# Patient Record
Sex: Male | Born: 1950 | Race: Black or African American | Hispanic: No | Marital: Married | Smoking: Current every day smoker
Health system: Southern US, Community
[De-identification: ages and names within clinical notes are randomized; demographics above are authoritative.]

## PROBLEM LIST (undated history)

## (undated) DIAGNOSIS — I10 Essential (primary) hypertension: Secondary | ICD-10-CM

## (undated) DIAGNOSIS — H409 Unspecified glaucoma: Secondary | ICD-10-CM

## (undated) DIAGNOSIS — H9190 Unspecified hearing loss, unspecified ear: Secondary | ICD-10-CM

---

## 2014-07-20 ENCOUNTER — Inpatient Hospital Stay (HOSPITAL_COMMUNITY)
Admission: EM | Admit: 2014-07-20 | Discharge: 2014-07-23 | DRG: 506 | Disposition: A | Discharge status: Home / Self Care | Payer: Medicare Other | Attending: Orthopaedic Surgery | Admitting: Orthopaedic Surgery

## 2014-07-20 ENCOUNTER — Emergency Department (HOSPITAL_COMMUNITY): Payer: Medicare Other

## 2014-07-20 ENCOUNTER — Encounter (HOSPITAL_COMMUNITY): Payer: Self-pay | Admitting: Emergency Medicine

## 2014-07-20 DIAGNOSIS — Z79899 Other long term (current) drug therapy: Secondary | ICD-10-CM

## 2014-07-20 DIAGNOSIS — M009 Pyogenic arthritis, unspecified: Secondary | ICD-10-CM | POA: Diagnosis not present

## 2014-07-20 DIAGNOSIS — M25539 Pain in unspecified wrist: Secondary | ICD-10-CM | POA: Diagnosis not present

## 2014-07-20 DIAGNOSIS — H409 Unspecified glaucoma: Secondary | ICD-10-CM | POA: Diagnosis present

## 2014-07-20 DIAGNOSIS — F17200 Nicotine dependence, unspecified, uncomplicated: Secondary | ICD-10-CM | POA: Diagnosis present

## 2014-07-20 DIAGNOSIS — I10 Essential (primary) hypertension: Secondary | ICD-10-CM | POA: Diagnosis present

## 2014-07-20 HISTORY — DX: Unspecified glaucoma: H40.9

## 2014-07-20 HISTORY — DX: Essential (primary) hypertension: I10

## 2014-07-20 LAB — BASIC METABOLIC PANEL
Anion gap: 14 (ref 5–15)
BUN: 10 mg/dL (ref 6–23)
CO2: 22 meq/L (ref 19–32)
CREATININE: 0.93 mg/dL (ref 0.50–1.35)
Calcium: 9.6 mg/dL (ref 8.4–10.5)
Chloride: 100 mEq/L (ref 96–112)
GFR calc Af Amer: >90 mL/min (ref >90)
GFR calc non Af Amer: 86 mL/min — ABNORMAL LOW (ref >90)
Glucose, Bld: 135 mg/dL — ABNORMAL HIGH (ref 70–99)
Potassium: 3.8 mEq/L (ref 3.7–5.3)
Sodium: 136 mEq/L — ABNORMAL LOW (ref 137–147)

## 2014-07-20 LAB — CBC WITH DIFFERENTIAL/PLATELET
Basophils Absolute: 0 10*3/uL (ref 0.0–0.1)
Basophils Relative: 0 % (ref 0–1)
EOS PCT: 0 % (ref 0–5)
Eosinophils Absolute: 0 10*3/uL (ref 0.0–0.7)
HEMATOCRIT: 36.1 % — AB (ref 39.0–52.0)
Hemoglobin: 12.1 g/dL — ABNORMAL LOW (ref 13.0–17.0)
LYMPHS PCT: 14 % (ref 12–46)
Lymphs Abs: 1.5 10*3/uL (ref 0.7–4.0)
MCH: 30.6 pg (ref 26.0–34.0)
MCHC: 33.5 g/dL (ref 30.0–36.0)
MCV: 91.2 fL (ref 78.0–100.0)
MONO ABS: 1.2 10*3/uL — AB (ref 0.1–1.0)
Monocytes Relative: 10 % (ref 3–12)
Neutro Abs: 8.7 10*3/uL — ABNORMAL HIGH (ref 1.7–7.7)
Neutrophils Relative %: 76 % (ref 43–77)
Platelets: 323 10*3/uL (ref 150–400)
RBC: 3.96 MIL/uL — ABNORMAL LOW (ref 4.22–5.81)
RDW: 12.1 % (ref 11.5–15.5)
WBC: 11.4 10*3/uL — AB (ref 4.0–10.5)

## 2014-07-20 LAB — SEDIMENTATION RATE: Sed Rate: 85 mm/hr — ABNORMAL HIGH (ref 0–16)

## 2014-07-20 LAB — URIC ACID: Uric Acid, Serum: 3.8 mg/dL — ABNORMAL LOW (ref 4.0–7.8)

## 2014-07-20 MED ORDER — OXYCODONE-ACETAMINOPHEN 5-325 MG PO TABS
1.0000 | ORAL_TABLET | Freq: Once | ORAL | Status: AC
  Administered 2014-07-20: 1 via ORAL
  Filled 2014-07-20: qty 1

## 2014-07-20 MED ORDER — ONDANSETRON HCL 4 MG/2ML IJ SOLN
INTRAMUSCULAR | Status: AC
  Filled 2014-07-20: qty 2

## 2014-07-20 MED ORDER — LIDOCAINE HCL 2 % IJ SOLN: 10.0000 mL | Freq: Once | INTRAMUSCULAR | Status: DC

## 2014-07-20 MED ORDER — VANCOMYCIN HCL 10 G IV SOLR
1250.0000 mg | Freq: Once | INTRAVENOUS | Status: AC
  Administered 2014-07-20: 1250 mg via INTRAVENOUS
  Filled 2014-07-20: qty 1250

## 2014-07-20 MED ORDER — SODIUM CHLORIDE 0.9 % IV BOLUS (SEPSIS)
1000.0000 mL | Freq: Once | INTRAVENOUS | Status: AC
  Administered 2014-07-20: 1000 mL via INTRAVENOUS

## 2014-07-20 MED ORDER — ARTIFICIAL TEARS OP OINT
TOPICAL_OINTMENT | OPHTHALMIC | Status: AC
  Filled 2014-07-20: qty 3.5

## 2014-07-20 MED ORDER — LIDOCAINE HCL (CARDIAC) 20 MG/ML IV SOLN
INTRAVENOUS | Status: AC
  Filled 2014-07-20: qty 5

## 2014-07-20 MED ORDER — PHENYLEPHRINE 40 MCG/ML (10ML) SYRINGE FOR IV PUSH (FOR BLOOD PRESSURE SUPPORT)
PREFILLED_SYRINGE | INTRAVENOUS | Status: AC
  Filled 2014-07-20: qty 10

## 2014-07-20 MED ORDER — PROPOFOL 10 MG/ML IV BOLUS
INTRAVENOUS | Status: AC
  Filled 2014-07-20: qty 20

## 2014-07-20 MED ORDER — FENTANYL CITRATE 0.05 MG/ML IJ SOLN
INTRAMUSCULAR | Status: AC
  Filled 2014-07-20: qty 5

## 2014-07-20 NOTE — ED Notes (Signed)
PT IS HARD OF HEARING. PRESENTS WITH LEFT WRIST REDNESS AND PAIN AND WARMTH, BEGAN THIS am. PAINFUL TO TOUCH. DENIES INJURY.

## 2014-07-20 NOTE — ED Provider Notes (Signed)
CSN: [id number]     Arrival date & time 07/20/14  1825 History   This chart was scribed for a non-physician practitioner, Emilia Beck, PA-C working with Glynn Octave, MD by [place] Peace, ED Scribe. The patient was seen in [facility]. The patient's care was started at 8:02 PM.     Chief Complaint  Patient presents with  . Wrist Pain      Patient is a 63 y.o. male presenting with wrist pain. The history is provided by the patient. No language interpreter was used.  Wrist Pain This is a new problem. The current episode started yesterday. The problem has been gradually worsening. Pertinent negatives include no headaches and no shortness of breath. The symptoms are aggravated by twisting and bending.  HPI Comments: Edwin Wright is a 63 y.o. male who presents to the Emergency Department complaining of left wrist pain onset yesterday with associated redness and swelling that has progressively gotten worse. He denies any falls or injuries that could be responsible for current problem. His relatives who are here with him the ED report that he lives alone. Pt has very hard time hearing but was able to respond to questions and commands once voice is raised.    Past Medical History  Diagnosis Date  . Hypertension   . Glaucoma    History reviewed. No pertinent past surgical history. History reviewed. No pertinent family history. History  Substance Use Topics  . Smoking status: Current Every Day Smoker    Types: Cigarettes  . Smokeless tobacco: Not on file  . Alcohol Use: No    Review of Systems  Constitutional: Negative for fever and chills.  Respiratory: Negative for shortness of breath.   Gastrointestinal: Negative for nausea and vomiting.  Musculoskeletal:       Left wrist pain with associated swelling, redness and warmth.   Skin: Positive for color change.  Neurological: Negative for headaches.  All other systems reviewed and are negative.     Allergies  Review of  patient's allergies indicates no known allergies.  Home Medications   Prior to Admission medications   Not on File   BP 174/74  Pulse 94  Temp(Src) 98.2 F (36.8 C) (Oral)  Resp 15  SpO2 98% Physical Exam  Nursing note and vitals reviewed. Constitutional: He is oriented to person, place, and time. He appears well-developed and well-nourished. No distress.  HENT:  Head: Normocephalic and atraumatic.  Eyes: Conjunctivae and EOM are normal.  Neck: Neck supple. No tracheal deviation present.  Cardiovascular: Normal rate.   Pulmonary/Chest: Effort normal. No respiratory distress.  Musculoskeletal: Normal range of motion.  Generalized left wrist edema and erythema and warm to touch. Severely limited ROM of left wrist due to pain. No obvious deformity. Limited ROM of left fingers due to pain as well. Cap refill of left hand intact.   Neurological: He is alert and oriented to person, place, and time.  Left hand sensation intact.   Skin: Skin is warm and dry.  Psychiatric: He has a normal mood and affect. His behavior is normal.     ED Course  ARTHOCENTESIS Date/Time: 07/20/2014 11:26 PM Performed by: Emilia Beck Authorized by: Emilia Beck Consent: Verbal consent obtained. Risks and benefits: risks, benefits and alternatives were discussed Consent given by: patient Patient understanding: patient states understanding of the procedure being performed Patient consent: the patient's understanding of the procedure matches consent given Patient identity confirmed: verbally with patient Indications: possible septic joint  Body area: wrist Joint: left  wrist Local anesthesia used: yes Anesthesia: local infiltration Local anesthetic: lidocaine 1% without epinephrine Anesthetic total: 3 ml Patient sedated: no Preparation: Patient was prepped and draped in the usual sterile fashion. Needle gauge: 22 G Ultrasound guidance: no Approach: dorsal. Aspirate: purulent Aspirate  amount: 1 ml Patient tolerance: Patient tolerated the procedure well with no immediate complications.   (including critical care time) Labs Review Labs Reviewed  CBC WITH DIFFERENTIAL - Abnormal; Notable for the following:    WBC 11.4 (*)    RBC 3.96 (*)    Hemoglobin 12.1 (*)    HCT 36.1 (*)    Neutro Abs 8.7 (*)    Monocytes Absolute 1.2 (*)    All other components within normal limits  BASIC METABOLIC PANEL - Abnormal; Notable for the following:    Sodium 136 (*)    Glucose, Bld 135 (*)    GFR calc non Af Amer 86 (*)    All other components within normal limits  URIC ACID - Abnormal; Notable for the following:    Uric Acid, Serum 3.8 (*)    All other components within normal limits  SEDIMENTATION RATE - Abnormal; Notable for the following:    Sed Rate 85 (*)    All other components within normal limits  SYNOVIAL CELL COUNT + DIFF, W/ CRYSTALS - Abnormal; Notable for the following:    Color, Synovial RED (*)    Appearance-Synovial CLOUDY (*)    Neutrophil, Synovial 85 (*)    Monocyte-Macrophage-Synovial Fluid 11 (*)    All other components within normal limits  GRAM STAIN  BODY FLUID CULTURE  ANAEROBIC CULTURE  C-REACTIVE PROTEIN  BODY FLUID CRYSTAL  CBC WITH DIFFERENTIAL  CBG MONITORING, ED     Results for orders placed during the hospital encounter of 07/20/14  GRAM STAIN      Result Value Ref Range   Specimen Description SYNOVIAL FLUID WRIST LEFT     Special Requests Normal     Gram Stain       Value: ABUNDANT WBC PRESENT,BOTH PMN AND MONONUCLEAR     NO ORGANISMS SEEN   Report Status 07/21/2014 FINAL    CBC WITH DIFFERENTIAL      Result Value Ref Range   WBC 11.4 (*) 4.0 - 10.5 K/uL   RBC 3.96 (*) 4.22 - 5.81 MIL/uL   Hemoglobin 12.1 (*) 13.0 - 17.0 g/dL   HCT 16.1 (*) 09.6 - 04.5 %   MCV 91.2  78.0 - 100.0 fL   MCH 30.6  26.0 - 34.0 pg   MCHC 33.5  30.0 - 36.0 g/dL   RDW 40.9  81.1 - 91.4 %   Platelets 323  150 - 400 K/uL   Neutrophils Relative % 76   43 - 77 %   Neutro Abs 8.7 (*) 1.7 - 7.7 K/uL   Lymphocytes Relative 14  12 - 46 %   Lymphs Abs 1.5  0.7 - 4.0 K/uL   Monocytes Relative 10  3 - 12 %   Monocytes Absolute 1.2 (*) 0.1 - 1.0 K/uL   Eosinophils Relative 0  0 - 5 %   Eosinophils Absolute 0.0  0.0 - 0.7 K/uL   Basophils Relative 0  0 - 1 %   Basophils Absolute 0.0  0.0 - 0.1 K/uL  BASIC METABOLIC PANEL      Result Value Ref Range   Sodium 136 (*) 137 - 147 mEq/L   Potassium 3.8  3.7 - 5.3 mEq/L   Chloride 100  96 - 112 mEq/L   CO2 22  19 - 32 mEq/L   Glucose, Bld 135 (*) 70 - 99 mg/dL   BUN 10  6 - 23 mg/dL   Creatinine, Ser 1.61  0.50 - 1.35 mg/dL   Calcium 9.6  8.4 - 09.6 mg/dL   GFR calc non Af Amer 86 (*) >90 mL/min   GFR calc Af Amer >90  >90 mL/min   Anion gap 14  5 - 15  URIC ACID      Result Value Ref Range   Uric Acid, Serum 3.8 (*) 4.0 - 7.8 mg/dL  SEDIMENTATION RATE      Result Value Ref Range   Sed Rate 85 (*) 0 - 16 mm/hr  SYNOVIAL CELL COUNT + DIFF, W/ CRYSTALS      Result Value Ref Range   Color, Synovial RED (*) YELLOW   Appearance-Synovial CLOUDY (*) CLEAR   Crystals, Fluid NO CRYSTALS SEEN     WBC, Synovial UNABLE TO PERFORM COUNT DUE TO CLOT IN SPECIMEN  0 - 200 /cu mm   Neutrophil, Synovial 85 (*) 0 - 25 %   Lymphocytes-Synovial Fld 3  0 - 20 %   Monocyte-Macrophage-Synovial Fluid 11 (*) 50 - 90 %   Eosinophils-Synovial 1  0 - 1 %   No results found.    Imaging Review Dg Wrist Complete Left  07/20/2014   CLINICAL DATA:  Pain and swelling about left wrist  EXAM: LEFT WRIST - COMPLETE 3+ VIEW  COMPARISON:  None.  FINDINGS: No acute fracture. No dislocation. There are cystic areas and erosions in the proximal scaphoid. There is also calcification within the soft tissues dorsal to the proximal carpal row, node on the lateral view. 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 Joint spaces are maintained. Bone mineral density is within normal limits.  IMPRESSION:  No acute bony injury.  Findings suggestive of CPPD. Septic arthritis is not entirely excluded. Correlate clinically.   Electronically Signed   By: Maryclare Bean M.D.   On: 07/20/2014 19:47     EKG Interpretation None     Medications  oxyCODONE-acetaminophen (PERCOCET/ROXICET) 5-325 MG per tablet 1 tablet (1 tablet Oral Given 07/20/14 1854)    8:07 PM- Treatment plan was discussed with patient who verbalizes understanding and agrees.   MDM   Final diagnoses:  Septic joint of left wrist    11:28 PM Patient presents with a 2 day history of left wrist swelling and pain. Xray shows no acute bony injury but suggest CPPD and cannot exclude septic arthritis. I spoke with Dr. Ophelia Charter of hand surgery who recommends joint aspiration for fluid analysis. When I aspirated the joint, the fluid was purulent. Dr. Ophelia Charter was called again and he will take the patient to the OR. Patient now has red streaking and warmth traveling proximally up his left arm which was not presents on my initial exam. Patient's labs show elevated WBC at 11.4, low uri acid at 3.8 and elevated sed rate at 85. Vitals stable and patient afebrile.    I personally performed the services described in this documentation, which was scribed in my presence. The recorded information has been reviewed and is accurate.    Emilia Beck, PA-C 07/21/14 [phone]

## 2014-07-20 NOTE — H&P (Addendum)
Edwin Wright is an 63 y.o. male.   Chief Complaint:painful left wrist, red and swollen HPI: 63 yo male with 24 hrs Hx of increase left wrist pain and swelling  Decrease ROM. No trauma Hx  Past Medical History  Diagnosis Date  . Hypertension   . Glaucoma     History reviewed. No pertinent past surgical history.  History reviewed. No pertinent family history. Social History:  reports that he has been smoking Cigarettes.  He has been smoking about 0.00 packs per day. He does not have any smokeless tobacco history on file. He reports that he does not drink alcohol or use illicit drugs.  Allergies: No Known Allergies   (Not in a hospital admission)  Results for orders placed during the hospital encounter of 07/20/14 (from the past 48 hour(s))  CBC WITH DIFFERENTIAL     Status: Abnormal   Collection Time    07/20/14  8:17 PM      Result Value Ref Range   WBC 11.4 (*) 4.0 - 10.5 K/uL   RBC 3.96 (*) 4.22 - 5.81 MIL/uL   Hemoglobin 12.1 (*) 13.0 - 17.0 g/dL   HCT 36.1 (*) 39.0 - 52.0 %   MCV 91.2  78.0 - 100.0 fL   MCH 30.6  26.0 - 34.0 pg   MCHC 33.5  30.0 - 36.0 g/dL   RDW 12.1  11.5 - 15.5 %   Platelets 323  150 - 400 K/uL   Neutrophils Relative % 76  43 - 77 %   Neutro Abs 8.7 (*) 1.7 - 7.7 K/uL   Lymphocytes Relative 14  12 - 46 %   Lymphs Abs 1.5  0.7 - 4.0 K/uL   Monocytes Relative 10  3 - 12 %   Monocytes Absolute 1.2 (*) 0.1 - 1.0 K/uL   Eosinophils Relative 0  0 - 5 %   Eosinophils Absolute 0.0  0.0 - 0.7 K/uL   Basophils Relative 0  0 - 1 %   Basophils Absolute 0.0  0.0 - 0.1 K/uL  BASIC METABOLIC PANEL     Status: Abnormal   Collection Time    07/20/14  8:17 PM      Result Value Ref Range   Sodium 136 (*) 137 - 147 mEq/L   Potassium 3.8  3.7 - 5.3 mEq/L   Chloride 100  96 - 112 mEq/L   CO2 22  19 - 32 mEq/L   Glucose, Bld 135 (*) 70 - 99 mg/dL   BUN 10  6 - 23 mg/dL   Creatinine, Ser 0.93  0.50 - 1.35 mg/dL   Calcium 9.6  8.4 - 10.5 mg/dL   GFR calc non Af  Amer 86 (*) >90 mL/min   GFR calc Af Amer >90  >90 mL/min   Comment: (NOTE)     The eGFR has been calculated using the CKD EPI equation.     This calculation has not been validated in all clinical situations.     eGFR's persistently <90 mL/min signify possible Chronic Kidney     Disease.   Anion gap 14  5 - 15  URIC ACID     Status: Abnormal   Collection Time    07/20/14  8:17 PM      Result Value Ref Range   Uric Acid, Serum 3.8 (*) 4.0 - 7.8 mg/dL  SEDIMENTATION RATE     Status: Abnormal   Collection Time    07/20/14  8:17 PM  Result Value Ref Range   Sed Rate 85 (*) 0 - 16 mm/hr   Dg Wrist Complete Left  07/20/2014   CLINICAL DATA:  Pain and swelling about left wrist  EXAM: LEFT WRIST - COMPLETE 3+ VIEW  COMPARISON:  None.  FINDINGS: No acute fracture. No dislocation. There are cystic areas and erosions in the proximal scaphoid. There is also calcification within the soft tissues dorsal to the proximal carpal row, node on the lateral view. 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 Joint spaces are maintained. Bone mineral density is within normal limits.  IMPRESSION: No acute bony injury.  Findings suggestive of CPPD. Septic arthritis is not entirely excluded. Correlate clinically.   Electronically Signed   By: Maryclare Bean M.D.   On: 07/20/2014 19:47    Review of Systems  Constitutional: Negative for fever, chills, weight loss and malaise/fatigue.  Eyes: Negative for blurred vision.       Glasses  Respiratory: Negative for cough.   Cardiovascular: Negative for chest pain.       HTN  Gastrointestinal: Negative for heartburn.  Genitourinary: Negative for dysuria.  Musculoskeletal: Positive for joint pain.  Skin: Negative for rash.  Neurological: Negative.  Negative for headaches.  Endo/Heme/Allergies: Negative.   Psychiatric/Behavioral: Negative.     Blood pressure 150/71, pulse 83, temperature 98.2 F (36.8 C), temperature source Oral,  resp. rate 12, SpO2 99.00%. Physical Exam  Constitutional: He appears well-developed and well-nourished.  HENT:  Head: Normocephalic.  Eyes: Pupils are equal, round, and reactive to light.  Neck: Normal range of motion.  Cardiovascular: Normal rate.   Respiratory: Effort normal.  GI: Soft.  Musculoskeletal:  Painful left wrist with erythema. Pain with ROM  Neurological: He is alert.  Skin: Rash noted. There is erythema.  Psychiatric: He has a normal mood and affect. His behavior is normal.     Assessment/Plan Left wrist pyarthrosis, aspiration pus by PA with cultures sent and Vanc. Starting.   Plan to OR for I and D of pyarthrosis  YATES,MARK C 07/20/2014, 11:45 PM

## 2014-07-21 ENCOUNTER — Emergency Department (HOSPITAL_COMMUNITY): Payer: Medicare Other | Admitting: Anesthesiology

## 2014-07-21 ENCOUNTER — Encounter (HOSPITAL_COMMUNITY): Payer: Medicare Other | Admitting: Anesthesiology

## 2014-07-21 ENCOUNTER — Encounter (HOSPITAL_COMMUNITY)
Admission: EM | Disposition: A | Discharge status: Home / Self Care | Payer: Self-pay | Source: Home / Self Care | Attending: Orthopaedic Surgery

## 2014-07-21 DIAGNOSIS — M25539 Pain in unspecified wrist: Secondary | ICD-10-CM | POA: Diagnosis present

## 2014-07-21 DIAGNOSIS — Z79899 Other long term (current) drug therapy: Secondary | ICD-10-CM | POA: Diagnosis not present

## 2014-07-21 DIAGNOSIS — M009 Pyogenic arthritis, unspecified: Secondary | ICD-10-CM | POA: Diagnosis present

## 2014-07-21 DIAGNOSIS — H409 Unspecified glaucoma: Secondary | ICD-10-CM | POA: Diagnosis present

## 2014-07-21 DIAGNOSIS — F17200 Nicotine dependence, unspecified, uncomplicated: Secondary | ICD-10-CM | POA: Diagnosis present

## 2014-07-21 DIAGNOSIS — I10 Essential (primary) hypertension: Secondary | ICD-10-CM | POA: Diagnosis present

## 2014-07-21 HISTORY — PX: ARTHROTOMY: SHX5728

## 2014-07-21 LAB — SYNOVIAL CELL COUNT + DIFF, W/ CRYSTALS
CRYSTALS FLUID: NONE SEEN
Eosinophils-Synovial: 1 % (ref 0–1)
Lymphocytes-Synovial Fld: 3 % (ref 0–20)
MONOCYTE-MACROPHAGE-SYNOVIAL FLUID: 11 % — AB (ref 50–90)
Neutrophil, Synovial: 85 % — ABNORMAL HIGH (ref 0–25)
WBC, SYNOVIAL: UNDETERMINED /mm3 (ref 0–200)

## 2014-07-21 LAB — CBC WITH DIFFERENTIAL/PLATELET
Basophils Absolute: 0 10*3/uL (ref 0.0–0.1)
Basophils Relative: 0 % (ref 0–1)
EOS ABS: 0.1 10*3/uL (ref 0.0–0.7)
Eosinophils Relative: 1 % (ref 0–5)
HCT: 37 % — ABNORMAL LOW (ref 39.0–52.0)
Hemoglobin: 12.4 g/dL — ABNORMAL LOW (ref 13.0–17.0)
Lymphocytes Relative: 15 % (ref 12–46)
Lymphs Abs: 1.6 10*3/uL (ref 0.7–4.0)
MCH: 30.5 pg (ref 26.0–34.0)
MCHC: 33.5 g/dL (ref 30.0–36.0)
MCV: 91.1 fL (ref 78.0–100.0)
Monocytes Absolute: 1.1 10*3/uL — ABNORMAL HIGH (ref 0.1–1.0)
Monocytes Relative: 10 % (ref 3–12)
NEUTROS PCT: 74 % (ref 43–77)
Neutro Abs: 8.1 10*3/uL — ABNORMAL HIGH (ref 1.7–7.7)
PLATELETS: 338 10*3/uL (ref 150–400)
RBC: 4.06 MIL/uL — ABNORMAL LOW (ref 4.22–5.81)
RDW: 12.1 % (ref 11.5–15.5)
WBC: 10.9 10*3/uL — ABNORMAL HIGH (ref 4.0–10.5)

## 2014-07-21 LAB — GRAM STAIN: Special Requests: NORMAL

## 2014-07-21 LAB — C-REACTIVE PROTEIN: CRP: 5.7 mg/dL — ABNORMAL HIGH (ref <0.60)

## 2014-07-21 SURGERY — ARTHROTOMY
Anesthesia: General | Site: Wrist | Laterality: Left

## 2014-07-21 MED ORDER — VANCOMYCIN HCL 10 G IV SOLR
1500.0000 mg | INTRAVENOUS | Status: DC
  Administered 2014-07-22: 1500 mg via INTRAVENOUS
  Filled 2014-07-21: qty 1500

## 2014-07-21 MED ORDER — VANCOMYCIN HCL IN DEXTROSE 750-5 MG/150ML-% IV SOLN
750.0000 mg | Freq: Two times a day (BID) | INTRAVENOUS | Status: DC
  Filled 2014-07-21: qty 150

## 2014-07-21 MED ORDER — OXYCODONE-ACETAMINOPHEN 5-325 MG PO TABS
1.0000 | ORAL_TABLET | ORAL | Status: DC | PRN
  Administered 2014-07-23: 1 via ORAL
  Filled 2014-07-21: qty 1

## 2014-07-21 MED ORDER — SODIUM CHLORIDE 0.9 % IR SOLN
Status: DC | PRN
  Administered 2014-07-21: 1000 mL

## 2014-07-21 MED ORDER — ONDANSETRON HCL 4 MG/2ML IJ SOLN
INTRAMUSCULAR | Status: DC | PRN
  Administered 2014-07-21: 4 mg via INTRAVENOUS

## 2014-07-21 MED ORDER — PIPERACILLIN-TAZOBACTAM 3.375 G IVPB 30 MIN
3.3750 g | INTRAVENOUS | Status: AC
  Administered 2014-07-21: 3.375 g via INTRAVENOUS
  Filled 2014-07-21: qty 50

## 2014-07-21 MED ORDER — HYDROMORPHONE HCL PF 1 MG/ML IJ SOLN: 0.2500 mg | INTRAMUSCULAR | Status: DC | PRN

## 2014-07-21 MED ORDER — PROPOFOL 10 MG/ML IV BOLUS
INTRAVENOUS | Status: DC | PRN
  Administered 2014-07-21: 150 mg via INTRAVENOUS

## 2014-07-21 MED ORDER — FENTANYL CITRATE 0.05 MG/ML IJ SOLN
INTRAMUSCULAR | Status: DC | PRN
  Administered 2014-07-21: 25 ug via INTRAVENOUS
  Administered 2014-07-21 (×2): 50 ug via INTRAVENOUS
  Administered 2014-07-21: 25 ug via INTRAVENOUS
  Administered 2014-07-21 (×2): 50 ug via INTRAVENOUS

## 2014-07-21 MED ORDER — PIPERACILLIN-TAZOBACTAM 3.375 G IVPB
3.3750 g | Freq: Three times a day (TID) | INTRAVENOUS | Status: DC
  Filled 2014-07-21: qty 50

## 2014-07-21 MED ORDER — SODIUM CHLORIDE 0.9 % IV SOLN
INTRAVENOUS | Status: DC | PRN
  Administered 2014-07-21: via INTRAVENOUS

## 2014-07-21 MED ORDER — PIPERACILLIN-TAZOBACTAM 3.375 G IVPB
3.3750 g | Freq: Three times a day (TID) | INTRAVENOUS | Status: DC
  Administered 2014-07-21 – 2014-07-23 (×6): 3.375 g via INTRAVENOUS
  Filled 2014-07-21 (×9): qty 50

## 2014-07-21 MED ORDER — STERILE WATER FOR INJECTION IJ SOLN
INTRAMUSCULAR | Status: AC
  Filled 2014-07-21: qty 10

## 2014-07-21 MED ORDER — KETOROLAC TROMETHAMINE 30 MG/ML IJ SOLN: 30.0000 mg | Freq: Once | INTRAMUSCULAR | Status: DC

## 2014-07-21 MED ORDER — HYDROMORPHONE HCL PF 1 MG/ML IJ SOLN: 0.5000 mg | INTRAMUSCULAR | Status: DC | PRN

## 2014-07-21 MED ORDER — LIDOCAINE HCL (CARDIAC) 20 MG/ML IV SOLN
INTRAVENOUS | Status: DC | PRN
  Administered 2014-07-21: 40 mg via INTRAVENOUS

## 2014-07-21 MED ORDER — VANCOMYCIN HCL IN DEXTROSE 750-5 MG/150ML-% IV SOLN
750.0000 mg | Freq: Two times a day (BID) | INTRAVENOUS | Status: DC
  Administered 2014-07-21: 750 mg via INTRAVENOUS
  Filled 2014-07-21: qty 150

## 2014-07-21 MED ORDER — ARTIFICIAL TEARS OP OINT
TOPICAL_OINTMENT | OPHTHALMIC | Status: DC | PRN
  Administered 2014-07-21: 1 via OPHTHALMIC

## 2014-07-21 MED ORDER — SODIUM CHLORIDE 0.9 % IV SOLN
INTRAVENOUS | Status: DC
Start: 2014-07-21 — End: 2014-07-23

## 2014-07-21 MED ORDER — EPHEDRINE SULFATE 50 MG/ML IJ SOLN
INTRAMUSCULAR | Status: DC | PRN
  Administered 2014-07-21 (×2): 5 mg via INTRAVENOUS

## 2014-07-21 MED ORDER — EPHEDRINE SULFATE 50 MG/ML IJ SOLN
INTRAMUSCULAR | Status: AC
  Filled 2014-07-21: qty 1

## 2014-07-21 SURGICAL SUPPLY — 46 items
BAG DECANTER FOR FLEXI CONT (MISCELLANEOUS) IMPLANT
BANDAGE ELASTIC 4 VELCRO ST LF (GAUZE/BANDAGES/DRESSINGS) ×3 IMPLANT
BNDG COHESIVE 4X5 TAN STRL (GAUZE/BANDAGES/DRESSINGS) IMPLANT
BNDG GAUZE ELAST 4 BULKY (GAUZE/BANDAGES/DRESSINGS) ×3 IMPLANT
CORDS BIPOLAR (ELECTRODE) ×3 IMPLANT
COVER SURGICAL LIGHT HANDLE (MISCELLANEOUS) ×3 IMPLANT
CUFF TOURNIQUET SINGLE 18IN (TOURNIQUET CUFF) ×3 IMPLANT
DRSG EMULSION OIL 3X3 NADH (GAUZE/BANDAGES/DRESSINGS) IMPLANT
DRSG PAD ABDOMINAL 8X10 ST (GAUZE/BANDAGES/DRESSINGS) IMPLANT
ELECT REM PT RETURN 9FT ADLT (ELECTROSURGICAL) ×3
ELECTRODE REM PT RTRN 9FT ADLT (ELECTROSURGICAL) ×1 IMPLANT
GAUZE SPONGE 4X4 12PLY STRL (GAUZE/BANDAGES/DRESSINGS) ×3 IMPLANT
GAUZE XEROFORM 1X8 LF (GAUZE/BANDAGES/DRESSINGS) ×3 IMPLANT
GAUZE XEROFORM 5X9 LF (GAUZE/BANDAGES/DRESSINGS) IMPLANT
GLOVE BIO SURGEON STRL SZ7 (GLOVE) ×3 IMPLANT
GLOVE BIOGEL PI IND STRL 7.0 (GLOVE) ×1 IMPLANT
GLOVE BIOGEL PI IND STRL 8 (GLOVE) ×1 IMPLANT
GLOVE BIOGEL PI INDICATOR 7.0 (GLOVE) ×2
GLOVE BIOGEL PI INDICATOR 8 (GLOVE) ×2
GLOVE ORTHO TXT STRL SZ7.5 (GLOVE) ×3 IMPLANT
GOWN STRL REUS W/ TWL LRG LVL3 (GOWN DISPOSABLE) ×1 IMPLANT
GOWN STRL REUS W/ TWL XL LVL3 (GOWN DISPOSABLE) ×1 IMPLANT
GOWN STRL REUS W/TWL LRG LVL3 (GOWN DISPOSABLE) ×2
GOWN STRL REUS W/TWL XL LVL3 (GOWN DISPOSABLE) ×2
HANDPIECE INTERPULSE COAX TIP (DISPOSABLE)
KIT BASIN OR (CUSTOM PROCEDURE TRAY) ×3 IMPLANT
KIT ROOM TURNOVER OR (KITS) ×3 IMPLANT
MANIFOLD NEPTUNE II (INSTRUMENTS) IMPLANT
NS IRRIG 1000ML POUR BTL (IV SOLUTION) ×3 IMPLANT
PACK ORTHO EXTREMITY (CUSTOM PROCEDURE TRAY) ×3 IMPLANT
PAD ARMBOARD 7.5X6 YLW CONV (MISCELLANEOUS) ×3 IMPLANT
SET HNDPC FAN SPRY TIP SCT (DISPOSABLE) IMPLANT
SPONGE GAUZE 4X4 12PLY STER LF (GAUZE/BANDAGES/DRESSINGS) ×3 IMPLANT
SPONGE LAP 18X18 X RAY DECT (DISPOSABLE) ×3 IMPLANT
SPONGE LAP 4X18 X RAY DECT (DISPOSABLE) IMPLANT
STOCKINETTE IMPERVIOUS 9X36 MD (GAUZE/BANDAGES/DRESSINGS) IMPLANT
SUT ETHILON 4 0 PS 2 18 (SUTURE) ×3 IMPLANT
SYSTEM CHEST DRAIN TLS 7FR (DRAIN) ×3 IMPLANT
TOWEL OR 17X24 6PK STRL BLUE (TOWEL DISPOSABLE) ×3 IMPLANT
TOWEL OR 17X26 10 PK STRL BLUE (TOWEL DISPOSABLE) ×3 IMPLANT
TUBE ANAEROBIC SPECIMEN COL (MISCELLANEOUS) IMPLANT
TUBE CONNECTING 12'X1/4 (SUCTIONS) ×1
TUBE CONNECTING 12X1/4 (SUCTIONS) ×2 IMPLANT
UNDERPAD 30X30 INCONTINENT (UNDERPADS AND DIAPERS) ×3 IMPLANT
WATER STERILE IRR 1000ML POUR (IV SOLUTION) IMPLANT
YANKAUER SUCT BULB TIP NO VENT (SUCTIONS) ×3 IMPLANT

## 2014-07-21 NOTE — Op Note (Signed)
NAMECHRISTIEN, FRANKL               ACCOUNT NO.:  [id number]  MEDICAL RECORD NO.:  [id number]  LOCATION:  [facility]                         FACILITY:  [facility]  PHYSICIAN:  Latania Bascomb C. Ophelia Charter, M.D.    DATE OF BIRTH:  08-24-1951  DATE OF PROCEDURE:  07/20/2014 DATE OF DISCHARGE:                              OPERATIVE REPORT   PREOPERATIVE DIAGNOSIS:  Left wrist joint infection.  POSTOPERATIVE DIAGNOSIS:  Left wrist joint infection.  PROCEDURE:  Arthrotomy, left wrist for prior arthrosis.  SURGEON:  Denario Bagot C. Ophelia Charter, M.D.  ANESTHESIA:  General.  TOURNIQUET TIME:  10 minutes.  HISTORY:  A 63 year old male presented to emergency room with swollen painful wrist.  Normal uric acid.  Aspiration by the ERTA revealed purulence, which has been sent for cultures.  He has been taken to the operating room for arthrotomy for infected wrist.  No history of trauma, falls, or penetrating injuries.  DESCRIPTION OF PROCEDURE:  After induction of general anesthesia, proximal arm tourniquet, prepping with DuraPrep, the usual extremity sheets and drapes were applied.  The tourniquet was at 250.  Time-out procedure was completed.  Sterile skin marker was made for gentle 2-cm S- shaped curve over the dorsal wrist joint.  Arm was elevated, tourniquet inflated.  Incision was made.  Vancomycin was about 50%, dripped in, and it was stopped at that point.  Interval between the 3rd and 4th extensor compartment was used and 4th compartment extensor tendons were pulled toward the ulnar aspect and the joint was entered at that level with a scalpel.  Ragnell retractors were placed.  Purulence was noted.  Culture has already been obtained in the ER and since vancomycin already been dripping in, no repeat culture was obtained.  Bulb syringe irrigation was performed.  Joint was vigorously irrigated.  Milking of the extensors, proximal and distal, did not reveal any evidence of extensor tenosynovitis.  Continued bulb irrigation  was performed for entire liter and multiple time stopping with re-position of the retractor to make sure there was good lavage through the joint with range of motion of the joint and distraction of the joint with visualization of the radioscaphoid joint.  Continued lavage.  Pressure applied to the ulnar aspect over the ulnar carpal region.  No further purulence was noted. After continued lavage, final suctioning showed the wrist joint was clear.  The joint cartilage looked normal.  A TLS drain was placed 7- [place] through a separate stab incision with care to avoid the extensor tendons.  Closure of the skin with 4-0 nylon. Xeroform, 4x4s, tweeners, Kerlix, and Ace wrap postoperative dressing. TLS was hooked up, maintained suction.  The patient tolerated the procedure well, and he was transferred to the recovery room.     Endora Teresi C. Ophelia Charter, M.D.     MCY/MEDQ  D:  07/21/2014  T:  07/21/2014  Job:  [id number]

## 2014-07-21 NOTE — Progress Notes (Signed)
Occupational Therapy Treatment Patient Details Name: Satoshi Kalas MRN: [id number] DOB: 1951/03/28 Today's Date: 07/21/2014    History of present illness Arthrotomy, left wrist for prior arthrosis   OT comments  Focus of this second session today was to provide pt with handout for LUE exercises and have him perform them. He could not remember what exercises I had had him do this AM. He could not read the handout I had due to his sister had taken his glasses home. I went over the exercises with him again and had him complete 10 reps of each. He needs continue OT here and at home.  Follow Up Recommendations  Home health OT    Equipment Recommendations  None recommended by OT       Precautions / Restrictions Precautions Precautions: Fall Precaution Comments: Very HOH Restrictions Weight Bearing Restrictions: No          Balance Overall balance assessment: Needs assistance Sitting-balance support: Single extremity supported;Feet supported Sitting balance-Leahy Scale: Good     Standing balance support: Single extremity supported Standing balance-Leahy Scale: Fair                     ADL Overall ADL's : Needs assistance/impaired                         Toilet Transfer: Supervision/safety;Ambulation;Comfort height toilet   Toileting- Clothing Manipulation and Hygiene: Supervision/safety;Sit to/from stand                          Cognition   Behavior During Therapy: [facility] for tasks assessed/performed         Memory: Decreased short-term memory (for exercises I had showed him earlier)                 Exercises Other Exercises Other Exercises: Provided pt with handout for LUE exercises, he reports he cannot see them due to his sister took his glasses home. I did leave the handout in his room for him to take home. I went back over the exercises I had shown him earlier (AAROM--SROM of his digits for flexion/extension and elbow flexion/extension)  He completed 10 reps of each while seated in recliner.  I encouraged him to do them every 2 hours that he is awake--he shook his head in understanding.           Pertinent Vitals/ Pain       Pain Assessment: No/denies pain         Frequency Min 2X/week     Progress Toward Goals  OT Goals(current goals can now be found in the care plan section)  Progress towards OT goals: Not progressing toward goals - comment (Not rememering exercies)  Acute Rehab OT Goals Patient Stated Goal: to get this hand better OT Goal Formulation: With patient Time For Goal Achievement: 07/22/14 Potential to Achieve Goals: Good ADL Goals Pt/caregiver will Perform Home Exercise Program: Increased ROM;Left upper extremity;Independently;With written HEP provided  Plan Discharge plan remains appropriate       End of Session Equipment Utilized During Treatment: Gait belt   Activity Tolerance Patient tolerated treatment well   Patient Left in chair;with call bell/phone within reach;with chair alarm set           Time: 4098-1191 OT Time Calculation (min): 21 min  Charges: OT General Charges $OT Visit: 1 Procedure OT Evaluation $Initial OT Evaluation Tier I: 1 Procedure OT Treatments $  Self Care/Home Management : 8-22 mins $Therapeutic Exercise: 8-22 mins  Evette Georges [phone] 07/21/2014, 4:52 PM

## 2014-07-21 NOTE — Progress Notes (Signed)
ANTIBIOTIC CONSULT NOTE - INITIAL  Pharmacy Consult for Vancomycin and Zosyn Indication: L septic wrist  No Known Allergies  Patient Measurements:   Stated: Ht 70 in   Wt 120 lb  Vital Signs: Temp: 97.6 F (36.4 C) (09/04 0114) Temp src: Oral (09/03 1837) BP: 161/82 mmHg (09/04 0133) Pulse Rate: 84 (09/04 0133) Intake/Output from previous day: 09/03 0701 - 09/04 0700 In: 800 [I.V.:800] Out: -  Intake/Output from this shift: Total I/O In: 800 [I.V.:800] Out: -   Labs:  Recent Labs  07/20/14 2017  WBC 11.4*  HGB 12.1*  PLT 323  CREATININE 0.93   CrCl is unknown because there is no height on file for the current visit. No results found for this basename: VANCOTROUGH, VANCOPEAK, VANCORANDOM, GENTTROUGH, GENTPEAK, GENTRANDOM, TOBRATROUGH, TOBRAPEAK, TOBRARND, AMIKACINPEAK, AMIKACINTROU, AMIKACIN,  in the last 72 hours   Microbiology: Recent Results (from the past 720 hour(s))  GRAM STAIN     Status: None   Collection Time    07/20/14 11:25 PM      Result Value Ref Range Status   Specimen Description SYNOVIAL FLUID WRIST LEFT   Final   Special Requests Normal   Final   Gram Stain     Final   Value: ABUNDANT WBC PRESENT,BOTH PMN AND MONONUCLEAR     NO ORGANISMS SEEN   Report Status 07/21/2014 FINAL   Final    Medical History: Past Medical History  Diagnosis Date  . Hypertension   . Glaucoma     Medications:  Prescriptions prior to admission  Medication Sig Dispense Refill  . PRESCRIPTION MEDICATION Take 1 tablet by mouth daily.        Assessment: 63 y.o. male presents with swollen L wrist. S/p I&D 9/4. Pt received Vanc  IV pre-op ~midnight. Estimated CrCl ~85 ml/min.  Goal of Therapy:  Vancomycin trough level 10-15 mcg/ml  Plan:  1. Vancomycin  IV q12h. Next dose at noon. 2. Zosyn 3.375gm IV q8h - first dose over 30 min then subsequent doses over 4 hours 3. Will f/u accurate weight, micro data, pt's clinical condition, and renal  function 4. Vanc trough prn  Christoper Fabian, PharmD, BCPS Clinical pharmacist, pager [phone] 07/21/2014,1:45 AM

## 2014-07-21 NOTE — Care Management Note (Signed)
CARE MANAGEMENT NOTE 07/21/2014  Patient:  Edwin Wright,Edwin Wright   Account Number:  [id number]  Date Initiated:  07/21/2014  Documentation initiated by:  Vance Peper  Subjective/Objective Assessment:   63 yr old male admitted with Left wrist infection, s/p I & D.     Action/Plan:   case manager spoke with patient concerning need for home health therapy. Referral called to Jodene Nam, [facility] liaison. Patient states he has assistance at home. Patient will need a quad cane. CM ordered.   Anticipated DC Date:  07/22/2014   Anticipated DC Plan:  HOME W HOME HEALTH SERVICES      DC Planning Services  CM consult      PAC Choice  DURABLE MEDICAL EQUIPMENT  HOME HEALTH   Choice offered to / List presented to:  C-1 Patient   DME arranged  CANE      DME agency  [facility]     HH arranged  HH-2 PT  HH-3 OT      [facility] agency  [facility]   Status of service:  Completed, signed off Medicare Important Message given?   (If response is "NO", the following Medicare IM given date fields will be blank) Date Medicare IM given:   Medicare IM given by:   Date Additional Medicare IM given:   Additional Medicare IM given by:    Discharge Disposition:  HOME W HOME HEALTH SERVICES  Per UR Regulation:  Reviewed for med. necessity/level of care/duration of stay  If discussed at Long Length of Stay Meetings, dates discussed:    Comments:

## 2014-07-21 NOTE — Anesthesia Procedure Notes (Signed)
Procedure Name: LMA Insertion Date/Time: 07/21/2014 12:32 AM Performed by: Elizbeth Squires R Pre-anesthesia Checklist: Patient identified, Emergency Drugs available, Suction available and Patient being monitored Patient Re-evaluated:Patient Re-evaluated prior to inductionOxygen Delivery Method: Circle system utilized Preoxygenation: Pre-oxygenation with 100% oxygen Intubation Type: IV induction LMA: LMA inserted LMA Size: 4.0 Tube type: Oral Number of attempts: 1 Placement Confirmation: positive ETCO2 and breath sounds checked- equal and bilateral Tube secured with: Tape Dental Injury: Teeth and Oropharynx as per pre-operative assessment

## 2014-07-21 NOTE — Anesthesia Postprocedure Evaluation (Signed)
  Anesthesia Post-op Note  Patient: Edwin Wright  Procedure(s) Performed: Procedure(s): ARTHROTOMY; IRRIGATION AND DRAINAGE OF LEFT WRIST INFECTION (Left)  Patient Location: PACU  Anesthesia Type:General  Level of Consciousness: awake and alert   Airway and Oxygen Therapy: Patient Spontanous Breathing  Post-op Pain: none  Post-op Assessment: Post-op Vital signs reviewed, Patient's Cardiovascular Status Stable and Respiratory Function Stable  Post-op Vital Signs: Reviewed  Filed Vitals:   07/21/14 0538  BP: 153/69  Pulse: 110  Temp: 36.4 C  Resp: 18    Complications: No apparent anesthesia complications

## 2014-07-21 NOTE — ED Provider Notes (Signed)
Medical screening examination/treatment/procedure(s) were conducted as a shared visit with non-physician practitioner(s) and myself.  I personally evaluated the patient during the encounter.  1 day of atraumatic L wrist pain.  Joint is hot, swollen, slightly red.  Unwilling to move wrist.  +2 radial pulse.  Concern for septic joint.  No hx gout.  Uric acid normal.   Joint aspiration performed by [facility] after d/w Dr. Ophelia Charter.  No obvious overlying cellulitis.  Purulence on aspiration.  To OR with Dr. Ophelia Charter.  EKG Interpretation None       Glynn Octave, MD 07/21/14 [phone]

## 2014-07-21 NOTE — Progress Notes (Signed)
Subjective: Day of Surgery Procedure(s) (LRB): ARTHROTOMY; IRRIGATION AND DRAINAGE OF LEFT WRIST INFECTION (Left) Patient reports pain as mild.    Objective: Vital signs in last 24 hours: Temp:  [97.6 F (36.4 C)-98.9 F (37.2 C)] 97.6 F (36.4 C) (09/04 0538) Pulse Rate:  [83-110] 110 (09/04 0538) Resp:  [12-24] 18 (09/04 0538) BP: (150-174)/(69-89) 153/69 mmHg (09/04 0538) SpO2:  [96 %-100 %] 100 % (09/04 0538)  Intake/Output from previous day: 09/03 0701 - 09/04 0700 In: 1075 [I.V.:1075] Out: 375 [Urine:375] Intake/Output this shift: Total I/O In: 240 [P.O.:240] Out: 125 [Urine:125]   Recent Labs  07/20/14 2017 07/21/14 0727  HGB 12.1* 12.4*    Recent Labs  07/20/14 2017 07/21/14 0727  WBC 11.4* 10.9*  RBC 3.96* 4.06*  HCT 36.1* 37.0*  PLT 323 338    Recent Labs  07/20/14 2017  NA 136*  K 3.8  CL 100  CO2 22  BUN 10  CREATININE 0.93  GLUCOSE 135*  CALCIUM 9.6   No results found for this basename: LABPT, INR,  in the last 72 hours  Neurologically intact  Assessment/Plan: Day of Surgery Procedure(s) (LRB): ARTHROTOMY; IRRIGATION AND DRAINAGE OF LEFT WRIST INFECTION (Left) Up with therapy ambulating in halls.  Cultures pending, on Vanc and Zosyn. Family to bring medications he takes at home in so we can get them ordered. Plan adjust ABX per culture results.   Verleen Stuckey C 07/21/2014, 9:59 AM

## 2014-07-21 NOTE — Anesthesia Preprocedure Evaluation (Signed)
Anesthesia Evaluation  Patient identified by MRN, date of birth, ID band Patient awake    Reviewed: Allergy & Precautions, H&P , NPO status , Patient's Chart, lab work & pertinent test results  Airway Mallampati: II TM Distance: >3 FB Neck ROM: Full    Dental no notable dental hx. (+) Poor Dentition, Dental Advisory Given   Pulmonary Current Smoker,  breath sounds clear to auscultation  Pulmonary exam normal       Cardiovascular hypertension, Rhythm:Regular Rate:Normal     Neuro/Psych negative neurological ROS  negative psych ROS   GI/Hepatic negative GI ROS, Neg liver ROS,   Endo/Other  negative endocrine ROS  Renal/GU negative Renal ROS  negative genitourinary   Musculoskeletal   Abdominal   Peds  Hematology negative hematology ROS (+)   Anesthesia Other Findings   Reproductive/Obstetrics negative OB ROS                           Anesthesia Physical Anesthesia Plan  ASA: II and emergent  Anesthesia Plan: General   Post-op Pain Management:    Induction: Intravenous  Airway Management Planned: LMA  Additional Equipment:   Intra-op Plan:   Post-operative Plan: Extubation in OR  Informed Consent: I have reviewed the patients History and Physical, chart, labs and discussed the procedure including the risks, benefits and alternatives for the proposed anesthesia with the patient or authorized representative who has indicated his/her understanding and acceptance.   Dental advisory given  Plan Discussed with: CRNA  Anesthesia Plan Comments:         Anesthesia Quick Evaluation

## 2014-07-21 NOTE — Evaluation (Signed)
Physical Therapy Evaluation Patient Details Name: Edwin Wright MRN: [id number] DOB: Feb 22, 1951 Today's Date: 07/21/2014   History of Present Illness  Arthrotomy, left wrist for prior arthrosis  Clinical Impression  Pt moves very slowly and has difficulty with coordination when given cueing for technique.  Pt does better when just "walking normally".  Would benefit from continued therapy to improve balance and safety as pt lives alone and family is only able to check on him daily.  Will continue to follow.      Follow Up Recommendations Home health PT;Supervision - Intermittent    Equipment Recommendations   Tour manager)    Recommendations for Other Services       Precautions / Restrictions Precautions Precautions: Fall Precaution Comments: Very HOH Restrictions Weight Bearing Restrictions: No      Mobility  Bed Mobility Overal bed mobility: Independent             General bed mobility comments: pt sitting in recliner  Transfers Overall transfer level: Needs assistance Equipment used: None Transfers: Sit to/from Stand Sit to Stand: Supervision         General transfer comment: pt needs R UE A for coming to stand and controlling descent to sitting.    Ambulation/Gait Ambulation/Gait assistance: Min guard Ambulation Distance (Feet): 150 Feet Assistive device: Quad cane Gait Pattern/deviations: Step-to pattern;Step-through pattern;Decreased stride length;Narrow base of support     General Gait Details: pt generally unsteady and fluctuates between step-to and step-through pattern.  pt does well to use QC in R UE, but when cued on technique, seems to have increased balance deficits and difficulty attending to balance.  When pt told to "just walk normal"  pt demos better balance.    Stairs Stairs:  (pt performed steps with OT.  )          Wheelchair Mobility    Modified Rankin (Stroke Patients Only)       Balance Overall balance assessment: Needs  assistance         Standing balance support: Single extremity supported Standing balance-Leahy Scale: Fair Standing balance comment: pt able to stand without UE support                             Pertinent Vitals/Pain Pain Assessment: 0-10 Pain Score: 2  Pain Location: L wrist  Pain Descriptors / Indicators: Aching Pain Intervention(s): Premedicated before session;Repositioned    Home Living Family/patient expects to be discharged to:: Private residence Living Arrangements: Alone Available Help at Discharge: Family;Available PRN/intermittently Type of Home: House Home Access: Stairs to enter Entrance Stairs-Rails: Doctor, general practice of Steps: 5 Home Layout: One level Home Equipment: None      Prior Function Level of Independence: Independent               Hand Dominance   Dominant Hand: Right    Extremity/Trunk Assessment   Upper Extremity Assessment: LUE deficits/detail       LUE Deficits / Details: Decreased AROM at digits due to edema; wrist and forearm movements constrained by bandaging   Lower Extremity Assessment: Generalized weakness         Communication   Communication: No difficulties  Cognition Arousal/Alertness: Awake/alert Behavior During Therapy: WFL for tasks assessed/performed Overall Cognitive Status: Within Functional Limits for tasks assessed                      General Comments  Exercises        Assessment/Plan    PT Assessment Patient needs continued PT services  PT Diagnosis Difficulty walking   PT Problem List Decreased strength;Decreased activity tolerance;Decreased balance;Decreased mobility;Decreased knowledge of use of DME  PT Treatment Interventions DME instruction;Gait training;Stair training;Functional mobility training;Therapeutic activities;Therapeutic exercise;Balance training;Patient/family education   PT Goals (Current goals can be found in the Care Plan section)  Acute Rehab PT Goals Patient Stated Goal: Get around better.   PT Goal Formulation: With patient Time For Goal Achievement: 07/28/14 Potential to Achieve Goals: Good    Frequency Min 3X/week   Barriers to discharge Decreased caregiver support Family can check on him daily.    Co-evaluation               End of Session Equipment Utilized During Treatment: Gait belt Activity Tolerance: Patient tolerated treatment well Patient left: in chair;with call bell/phone within reach;with chair alarm set Nurse Communication: Mobility status         Time: 1610-9604 PT Time Calculation (min): 28 min   Charges:   PT Evaluation $Initial PT Evaluation Tier I: 1 Procedure PT Treatments $Gait Training: 23-37 mins   PT G CodesSunny Schlein, [place] [phone] 07/21/2014, 11:58 AM

## 2014-07-21 NOTE — Brief Op Note (Signed)
07/20/2014 - 07/21/2014  1:12 AM  PATIENT:  Edwin Wright  63 y.o. male  PRE-OPERATIVE DIAGNOSIS:  left septic wrist  POST-OPERATIVE DIAGNOSIS:  left septic wrist  PROCEDURE:  Procedure(s): ARTHROTOMY; IRRIGATION AND DRAINAGE OF LEFT WRIST INFECTION (Left)  SURGEON:  Surgeon(s) and Role:    * Eldred Manges, MD - Primary  PHYSICIAN ASSISTANT:   ASSISTANTS: none   ANESTHESIA:   general  EBL:  Total I/O In: 800 [I.V.:800] Out: -   BLOOD ADMINISTERED:none  DRAINS: TLS  7    French drain  LOCAL MEDICATIONS USED:  NONE  SPECIMEN:  No Specimen  DISPOSITION OF SPECIMEN:  N/A  COUNTS:  YES  TOURNIQUET:   Total Tourniquet Time Documented: Upper Arm (Left) - 11 minutes Total: Upper Arm (Left) - 11 minutes   DICTATION: .Other Dictation: Dictation Number [id number]  PLAN OF CARE: Admit to inpatient   PATIENT DISPOSITION:  PACU - hemodynamically stable.   Delay start of Pharmacological VTE agent (>24hrs) due to surgical blood loss or risk of bleeding: not applicable

## 2014-07-21 NOTE — Transfer of Care (Signed)
Immediate Anesthesia Transfer of Care Note  Patient: Edwin Wright  Procedure(s) Performed: Procedure(s): ARTHROTOMY; IRRIGATION AND DRAINAGE OF LEFT WRIST INFECTION (Left)  Patient Location: PACU  Anesthesia Type:General  Level of Consciousness: awake  Airway & Oxygen Therapy: Patient Spontanous Breathing and Patient connected to nasal cannula oxygen  Post-op Assessment: Report given to PACU RN, Post -op Vital signs reviewed and stable and Patient moving all extremities  Post vital signs: Reviewed and stable  Complications: No apparent anesthesia complications

## 2014-07-21 NOTE — Progress Notes (Signed)
Utilization review completed.  

## 2014-07-21 NOTE — ED Notes (Signed)
Reported to OR staff  CBG  Needed to be done. OR staff agreed to complete.

## 2014-07-21 NOTE — Evaluation (Addendum)
Occupational Therapy Evaluation Patient Details Name: Edwin Wright MRN: [id number] DOB: 23-Aug-1951 Today's Date: 07/21/2014    History of Present Illness Arthrotomy, left wrist for prior arthrosis   Clinical Impression   This 63 yo male admitted and underwent above presents to acute OT with decreased use of LUE and mild balance deficits. He will benefit from acute OT with follow up HHOT.    Follow Up Recommendations  Home health OT    Equipment Recommendations  None recommended by OT       Precautions / Restrictions Precautions Precautions: Fall Precaution Comments: Very HOH Restrictions Weight Bearing Restrictions: No      Mobility Bed Mobility                  Transfers                    Balance Overall balance assessment: Needs assistance Sitting-balance support: Single extremity supported;Feet supported Sitting balance-Leahy Scale: Good     Standing balance support: Single extremity supported Standing balance-Leahy Scale: Fair                              ADL Overall ADL's : Needs assistance/impaired                         Toilet Transfer: Supervision/safety;Ambulation;Comfort height toilet   Toileting- Clothing Manipulation and Hygiene: Supervision/safety;Sit to/from stand                         Pertinent Vitals/Pain Pain Assessment: No/denies pain     Hand Dominance     Extremity/Trunk Assessment           Communication     Cognition Arousal/Alertness: Awake/alert Behavior During Therapy: WFL for tasks assessed/performed         Memory: Decreased short-term memory (for exercises I had showed him earlier)                        Home Living                                          Prior Functioning/Environment               OT Diagnosis: Generalized weakness   OT Problem List: Decreased strength;Decreased range of motion;Impaired balance (sitting and/or  standing);Impaired UE functional use   OT Treatment/Interventions: Patient/family education;Therapeutic exercise    OT Goals(Current goals can be found in the care plan section) Acute Rehab OT Goals Patient Stated Goal: to get this hand better OT Goal Formulation: With patient Time For Goal Achievement: 07/22/14 Potential to Achieve Goals: Good ADL Goals Pt/caregiver will Perform Home Exercise Program: Increased ROM;Left upper extremity;Independently;With written HEP provided  OT Frequency: Min 2X/week              End of Session Equipment Utilized During Treatment: Gait belt  Activity Tolerance: Patient tolerated treatment well Patient left: in chair;with call bell/phone within reach;with chair alarm set   Time: 1324-4010 OT Time Calculation (min): 21 min Charges:  OT General Charges $OT Visit: 1 Procedure OT Evaluation $Initial OT Evaluation Tier I: 1 Procedure OT Treatments $Self Care/Home Management : 8-22 mins $Therapeutic Exercise: 8-22 mins  Evette Georges [phone] 07/21/2014, 4:57  PM

## 2014-07-22 MED ORDER — VANCOMYCIN HCL IN DEXTROSE 1-5 GM/200ML-% IV SOLN
1000.0000 mg | INTRAVENOUS | Status: DC
  Filled 2014-07-22: qty 200

## 2014-07-22 NOTE — Progress Notes (Signed)
Physical Therapy Treatment Patient Details Name: Edwin Wright MRN: [id number] DOB: 07/26/1951 Today's Date: 07/22/2014    History of Present Illness Arthrotomy, left wrist for prior arthrosis    PT Comments    Patient progressing towards physical therapy goals. Ambulating very slowly up to 150 feet with supervision while using a quad cane this AM. Feel he is adequate for d/c from a mobility standpoint and will greatly benefit from continued skilled PT services with HHPT upon d/c. Will continue to follow acutely while admitted focusing on gait training and safety with mobility.  Follow Up Recommendations  Home health PT;Supervision - Intermittent     Equipment Recommendations  Other (comment) Tour manager)    Recommendations for Other Services       Precautions / Restrictions Precautions Precautions: Fall Precaution Comments: Very HOH Restrictions Weight Bearing Restrictions: No    Mobility  Bed Mobility Overal bed mobility: Modified Independent             General bed mobility comments: Requires extra time  Transfers Overall transfer level: Needs assistance Equipment used: Quad cane Transfers: Sit to/from Stand Sit to Stand: Supervision         General transfer comment: Supervision for safety. VC for technique. Uses RUE to stand and sit.  Ambulation/Gait Ambulation/Gait assistance: Supervision Ambulation Distance (Feet): 150 Feet Assistive device: Quad cane Gait Pattern/deviations: Step-through pattern;Decreased stride length;Narrow base of support;Trunk flexed   Gait velocity interpretation: Below normal speed for age/gender General Gait Details: Very slow and guarded speed but no loss of balance with use of quad cane. VC for upright posture and was able to maintain good posture during distance after qued. Practed stepping backwards and sidestepping.   Stairs            Wheelchair Mobility    Modified Rankin (Stroke Patients Only)        Balance                                    Cognition Arousal/Alertness: Awake/alert Behavior During Therapy: WFL for tasks assessed/performed Overall Cognitive Status: Within Functional Limits for tasks assessed                      Exercises      General Comments        Pertinent Vitals/Pain Pain Assessment: 0-10 Pain Score:  ("a little more pain today" no value given) Pain Location: left wrist Pain Intervention(s): Limited activity within patient's tolerance;Monitored during session;Repositioned    Home Living                      Prior Function            PT Goals (current goals can now be found in the care plan section) Acute Rehab PT Goals PT Goal Formulation: With patient Time For Goal Achievement: 07/28/14 Potential to Achieve Goals: Good Progress towards PT goals: Progressing toward goals    Frequency  Min 3X/week    PT Plan Current plan remains appropriate    Co-evaluation             End of Session   Activity Tolerance: Patient tolerated treatment well Patient left: in chair;with call bell/phone within reach;with chair alarm set     Time: 2536-6440 PT Time Calculation (min): 29 min  Charges:  $Gait Training: 23-37 mins  G Codes:      Charlsie Merles, [place] [phone]  Berton Mount 07/22/2014, 9:49 AM

## 2014-07-22 NOTE — Progress Notes (Signed)
ANTIBIOTIC CONSULT NOTE - INITIAL  Pharmacy Consult for Vancomycin and Zosyn Indication: L septic wrist  No Known Allergies  Patient Measurements: Height:  (177.8 cm) Weight: 120 lb (54.432 kg) IBW/kg (Calculated) : 73 Stated: Ht 70 in   Wt 120 lb  Vital Signs: Temp: 98.1 F (36.7 C) (09/05 1459) Temp src: Oral (09/05 1459) BP: 137/63 mmHg (09/05 1459) Pulse Rate: 95 (09/05 1459) Intake/Output from previous day: 09/04 0701 - 09/05 0700 In: 970 [P.O.:480; I.V.:240; IV Piggyback:250] Out: 525 [Urine:525] Intake/Output from this shift: Total I/O In: 770 [P.O.:720; IV Piggyback:50] Out: 750 [Urine:750]  Labs:  Recent Labs  07/20/14 2017 07/21/14 0727  WBC 11.4* 10.9*  HGB 12.1* 12.4*  PLT 323 338  CREATININE 0.93  --    Estimated Creatinine Clearance: 60.9 ml/min (by C-G formula based on Cr of 0.93). No results found for this basename: VANCOTROUGH, VANCOPEAK, VANCORANDOM, GENTTROUGH, GENTPEAK, GENTRANDOM, TOBRATROUGH, TOBRAPEAK, TOBRARND, AMIKACINPEAK, AMIKACINTROU, AMIKACIN,  in the last 72 hours   Microbiology: Recent Results (from the past 720 hour(s))  BODY FLUID CULTURE     Status: None   Collection Time    07/20/14 11:25 PM      Result Value Ref Range Status   Specimen Description SYNOVIAL WRIST LEFT   Final   Special Requests Normal   Final   Gram Stain     Final   Value: ABUNDANT WBC PRESENT,BOTH PMN AND MONONUCLEAR     NO ORGANISMS SEEN     Performed at [facility]     Performed at [facility]   Culture     Final   Value: NO GROWTH 1 DAY     Performed at [facility]   Report Status PENDING   Incomplete  GRAM STAIN     Status: None   Collection Time    07/20/14 11:25 PM      Result Value Ref Range Status   Specimen Description SYNOVIAL FLUID WRIST LEFT   Final   Special Requests Normal   Final   Gram Stain     Final   Value: ABUNDANT WBC PRESENT,BOTH PMN AND MONONUCLEAR     NO ORGANISMS SEEN   Report Status  07/21/2014 FINAL   Final  ANAEROBIC CULTURE     Status: None   Collection Time    07/20/14 11:25 PM      Result Value Ref Range Status   Specimen Description FLUID SYNOVIAL LEFT WRIST   Final   Special Requests ADD ON 07/21/14 AT 0015 BY MD   Final   Gram Stain PENDING   Incomplete   Culture     Final   Value: NO ANAEROBES ISOLATED; CULTURE IN PROGRESS FOR 5 DAYS     Performed at [facility]   Report Status PENDING   Incomplete    Medical History: Past Medical History  Diagnosis Date  . Hypertension   . Glaucoma     Medications:  Prescriptions prior to admission  Medication Sig Dispense Refill  . amLODipine (NORVASC) 10 MG tablet Take 10 mg by mouth daily.      . brimonidine-timolol (COMBIGAN) 0.2-0.5 % ophthalmic solution Place 1 drop into both eyes every 12 (twelve) hours.      . dorzolamide (TRUSOPT) 2 % ophthalmic solution Place 1 drop into both eyes 2 (two) times daily.      Marland Kitchen latanoprost (XALATAN) 0.005 % ophthalmic solution Place 1 drop into both eyes at bedtime.  Assessment: 63 y.o. male presented to ED on 07/20/14 with swollen L wrist. S/p I&D 9/4. Pt received Vanc  IV pre-op and  IV vancomycin today.  Patient's weight is 54.4 kg.  SCr 0.93, estimated CrCl ~61 ml/min. Afebrile. WBC 10.9K. Cultures negative so far.  Goal of Therapy:  Vancomycin trough level 10-15 mcg/ml  Plan:  1.Adjust Vancomycin dose to  IV q24h. Next dose tomorrow at 1600. 2. Continue Zosyn 3.375gm IV q8h - 3. Vanc trough prn  Noah Delaine, RPh Clinical Pharmacist Pager: [phone] 07/22/2014,3:40 PM

## 2014-07-22 NOTE — Progress Notes (Addendum)
Subjective: 1 Day Post-Op Procedure(s) (LRB): ARTHROTOMY; IRRIGATION AND DRAINAGE OF LEFT WRIST INFECTION (Left) Patient reports pain as mild.    Objective: Vital signs in last 24 hours: Temp:  [97.6 F (36.4 C)-100.3 F (37.9 C)] 97.7 F (36.5 C) (09/05 0533) Pulse Rate:  [103-107] 103 (09/05 0533) Resp:  [20-25] 20 (09/05 0533) BP: (132-137)/(59-72) 137/72 mmHg (09/05 0533) SpO2:  [95 %-99 %] 95 % (09/05 0533) Weight:  [54.432 kg (120 lb)] 54.432 kg (120 lb) (09/04 1209)  Intake/Output from previous day: 09/04 0701 - 09/05 0700 In: 970 [P.O.:480; I.V.:240; IV Piggyback:250] Out: 525 [Urine:525] Intake/Output this shift: Total I/O In: 50 [IV Piggyback:50] Out: 200 [Urine:200]   Recent Labs  07/20/14 2017 07/21/14 0727  HGB 12.1* 12.4*    Recent Labs  07/20/14 2017 07/21/14 0727  WBC 11.4* 10.9*  RBC 3.96* 4.06*  HCT 36.1* 37.0*  PLT 323 338    Recent Labs  07/20/14 2017  NA 136*  K 3.8  CL 100  CO2 22  BUN 10  CREATININE 0.93  GLUCOSE 135*  CALCIUM 9.6   No results found for this basename: LABPT, INR,  in the last 72 hours  Neurologically intact  Assessment/Plan: 1 Day Post-Op Procedure(s) (LRB): ARTHROTOMY; IRRIGATION AND DRAINAGE OF LEFT WRIST INFECTION (Left) Plan: continue IV ABX. Cultures neg so far. He had pus in the joint. Will decide tomorrow about ABX treatment Will obtain ID consult.  May need PIC line Dorice Stiggers C 07/22/2014, 10:45 AM

## 2014-07-22 NOTE — Progress Notes (Signed)
Occupational Therapy Treatment Patient Details Name: Edwin Wright MRN: [id number] DOB: 05-22-1951 Today's Date: 07/22/2014    History of present illness Arthrotomy, left wrist for prior arthrosis   OT comments  Pt seen today for therapeutic exercises. Pt initially with poor recall of exercises, however with visual aid of written HEP pt had memory of OT session. Pt wife present and educated on HEP. Pt performed therapeutic exercises with OT Supervision and assist.   Follow Up Recommendations  Home health OT    Equipment Recommendations  None recommended by OT    Recommendations for Other Services      Precautions / Restrictions Precautions Precautions: Fall Precaution Comments: Very HOH Restrictions Weight Bearing Restrictions: No       Mobility Bed Mobility                  Transfers Overall transfer level: Needs assistance Equipment used: Quad cane Transfers: Sit to/from Stand Sit to Stand: Supervision         General transfer comment: Supervision for safety. VC for technique. Uses RUE to stand and sit.        ADL Overall ADL's : Needs assistance/impaired                         Toilet Transfer: Supervision/safety;Ambulation;Comfort height toilet (quad cane)   Toileting- Clothing Manipulation and Hygiene: Supervision/safety;Sit to/from stand       Functional mobility during ADLs: Supervision/safety (quad cane) General ADL Comments: Pt reports increased pain today, but motivated to complete exercises. Pt performed self-rom/AROM of LUE with provided handout and superivision/instruction from OT. Pt ambulated to bathroom for toileting with Supervision.                 Cognition  Arousal/Alertness: Awake/Alert Behavior During Therapy: WFL for tasks assessed/performed Overall Cognitive Status: Within Functional Limits for tasks assessed       Memory: Decreased short-term memory (for LUE exercises)                 Exercises Other  Exercises Other Exercises: Reinforced education for LUE exercises and positioning for edema control and pain. Reminded pt to complete exercises every 2 hours. Wife present and verbalized understanding.      General Comments   Reinforced education for LUE exercises and positioning for edema control and pain. Reminded pt to complete exercises every 2 hours. Wife present and verbalized understanding.    Pertinent Vitals/ Pain       Pain Assessment: Faces Faces Pain Scale: Hurts whole lot Pain Location: left wrist Pain Descriptors / Indicators: Constant;Sore Pain Intervention(s): Limited activity within patient's tolerance;Monitored during session;Repositioned;Ice applied         Frequency Min 2X/week     Progress Toward Goals  OT Goals(current goals can now be found in the care plan section)  Progress towards OT goals: Progressing toward goals (slowly with visual aid of HEP)  Acute Rehab OT Goals Patient Stated Goal: to get this hand better OT Goal Formulation: With patient Time For Goal Achievement: 07/22/14 Potential to Achieve Goals: Good ADL Goals Pt/caregiver will Perform Home Exercise Program: Increased ROM;Left upper extremity;Independently;With written HEP provided Additional ADL Goal #1: Pt will be indepedent in postioning for LUE when he is sedentary  Plan Discharge plan remains appropriate       End of Session Equipment Utilized During Treatment: Other (comment);Gait belt (quad cane)   Activity Tolerance Patient tolerated treatment well   Patient Left in chair;with  call bell/phone within reach;with chair alarm set;with family/visitor present   Nurse Communication          Time: [phone] OT Time Calculation (min): 24 min  Charges: OT General Charges $OT Visit: 1 Procedure OT Treatments $Self Care/Home Management : 8-22 mins $Therapeutic Exercise: 8-22 mins  Rae Lips [phone] 07/22/2014, 1:12 PM

## 2014-07-23 MED ORDER — HYDROCODONE-ACETAMINOPHEN 5-325 MG PO TABS: 1.0000 | ORAL_TABLET | ORAL | Status: DC | PRN

## 2014-07-23 MED ORDER — DOXYCYCLINE HYCLATE 100 MG PO TABS
100.0000 mg | ORAL_TABLET | Freq: Two times a day (BID) | ORAL | Status: DC
  Filled 2014-07-23 (×2): qty 1

## 2014-07-23 MED ORDER — DOXYCYCLINE HYCLATE 100 MG PO TABS: 100.0000 mg | ORAL_TABLET | Freq: Two times a day (BID) | ORAL | Status: DC

## 2014-07-23 NOTE — Progress Notes (Signed)
Pt started complaining of pain in the bottom of his left foot during the night. No swelling or redness noticed. Nurse tech noticed a change in the way the pt walked from earlier in the shift. Foot elevated on pillow. States he only has a little pain at 0600.

## 2014-07-23 NOTE — Progress Notes (Addendum)
Subjective: 2 Days Post-Op Procedure(s) (LRB): ARTHROTOMY; IRRIGATION AND DRAINAGE OF LEFT WRIST INFECTION (Left) Patient reports pain as mild.    Objective: Vital signs in last 24 hours: Temp:  [98.1 F (36.7 C)-98.9 F (37.2 C)] 98.1 F (36.7 C) (09/06 0800) Pulse Rate:  [86-97] 86 (09/06 0533) Resp:  [18] 18 (09/06 0533) BP: (123-137)/(63-67) 126/65 mmHg (09/06 0533) SpO2:  [96 %-98 %] 97 % (09/06 0533)  Intake/Output from previous day: 09/05 0701 - 09/06 0700 In: 1210 [P.O.:1080; I.V.:80; IV Piggyback:50] Out: 1175 [Urine:1175] Intake/Output this shift:     Recent Labs  07/20/14 2017 07/21/14 0727  HGB 12.1* 12.4*    Recent Labs  07/20/14 2017 07/21/14 0727  WBC 11.4* 10.9*  RBC 3.96* 4.06*  HCT 36.1* 37.0*  PLT 323 338    Recent Labs  07/20/14 2017  NA 136*  K 3.8  CL 100  CO2 22  BUN 10  CREATININE 0.93  GLUCOSE 135*  CALCIUM 9.6   No results found for this basename: LABPT, INR,  in the last 72 hours  Neurologically intact  Assessment/Plan: 2 Days Post-Op Procedure(s) (LRB): ARTHROTOMY; IRRIGATION AND DRAINAGE OF LEFT WRIST INFECTION (Left) Plan discharge home on doxycycline  po bid.  All cultures neg. Most common organism would be Staph. Office 3 to 5 days. Discussed with ID dr. Ninetta Lights plan and doxy and he agrees.   YATES,MARK C 07/23/2014, 10:25 AM

## 2014-07-23 NOTE — Discharge Instructions (Signed)
Bone and Joint Infections °Joint infections are called septic or infectious arthritis. An infected joint may damage cartilage and tissue very quickly. This may destroy the joint. Bone infections (osteomyelitis) may last for years. Joints may become stiff if left untreated. Bacteria are the most common cause. Other causes include viruses and fungi, but these are more rare. Bone and joint infections usually come from injury or infection elsewhere in your body; the germs are carried to your bones or joints through the bloodstream.  °CAUSES  °· Blood-carried germs from an infection elsewhere in your body can eventually spread to a bone or joint. The germ staphylococcus is the most common cause of both osteomyelitis and septic arthritis. °· An injury can introduce germs into your bones or joints. °SYMPTOMS  °· Weight loss. °· Tiredness. °· Chills and fever. °· Bone or joint pain at rest and with activity. °· Tenderness when touching the area or bending the joint. °· Refusal to bear weight on a leg or inability to use an arm due to pain. °· Decreased range of motion in a joint. °· Skin redness, warmth, and tenderness. °· Open skin sores and drainage. °RISK FACTORS °Children, the elderly, and those with weak immune systems are at increased risk of bone and joint infections. It is more common in people with HIV infections and with people on chemotherapy. People are also at increased risk if they have surgery where metal implants are used to stabilize the bone. Plates, screws, or artificial joints provide a surface that bacteria can stick on. Such a growth of bacteria is called biofilm. The biofilm protects bacteria from antibiotics and bodily defenses. This allows germs to multiply. Other reasons for increased risks include:  °· Having previous surgery or injury of a bone or joint. °· Being on high-dose corticosteroids and immunosuppressive medications that weaken your body's resistance to germs. °· Diabetes and  long-standing diseases. °· Use of intravenous street drugs. °· Being on hemodialysis. °· Having a history of urinary tract infections. °· Removal of your spleen (splenectomy). This weakens your immunity. °· Chronic viral infections such as HIV or AIDS. °· Lack of sensation such as paraplegia, quadriplegia, or spina bifida. °DIAGNOSIS  °· Increased numbers of white blood cells in your blood may indicate infection. Some times your caregivers are able to identify the infecting germs by testing your blood. Inflammatory markers present in your bloodstream such as an erythrocyte sedimentation rate (ESR or sed rate) or c-reactive protein (CRP) can be indicators of deep infection. °· Bone scans and X-ray exams are necessary for diagnosing osteomyelitis. They may help your caregiver find the infected areas. Other studies may give more detailed information. They may help detect fluid collections around a joint, abnormal bone surfaces, or be useful in diagnosing septic arthritis. They can find soft tissue swelling and find excess fluid in an infected joint or the adjacent bone. These tests include: °¨ Ultrasound. °¨ CT (computerized tomography). °¨ MRI (magnetic resonance imaging). °· The best test for diagnosing a bone or joint infection is an aspiration or biopsy. Your caregiver will usually use a local anesthetic. He or she can then remove tissue from a bone injury or use a needle to take fluids from an infected joint. A local anesthetic medication numbs the area to be biopsied. Often biopsies are done in the operating room under general anesthesia. This means you will be asleep during the procedure. Tests performed on these samples can identify an infection. °TREATMENT  °· Treatment can help control long-standing   infections, but infections may come back.  Infections can infect any bone or joint at any age.  Bone and joint infections are rarely fatal.  Bone infection left untreated can become a never-ending infection.  It can spread to other areas of your body. It may eventually cause bone death. Reduced limb or joint function can result. In severe cases, this may require removal of a limb. Spinal osteomyelitis is very dangerous. Untreated, it may damage spinal nerves and cause death.  The most common complication of septic arthritis is osteoarthritis with pain and decreased range of motion of the joint. Some forms of treatment may include:  If the infection is caused by bacteria, it is generally treated with antibiotics. You will likely receive the drugs through a vein (intravenously) for anywhere from 2 to 6 weeks. In some cases, especially with children, oral antibiotics following an initial intravenous dose may be effective. The treatment you receive depends on the:  Type of bacteria.  Location of the infection.  Type of surgery that might be done.  Other health conditions or issues you might have.  Your caregiver may drain soft tissue abscesses or pockets of fluid around infected bones or joints. If you have septic arthritis, your caregiver may use a needle to drain pus from the joint on a daily basis. He or she may use an arthroscope to clean the joint or may need to open the joint surgically to remove damaged tissue and infection. An arthroscope is an instrument like a thin lighted telescope. It can be used to look inside the joint.  Surgery is usually needed if the infection has become long-standing. It may also be needed if there is hardware (such as metal plates, screws, or artificial joints) inside the patient. Sometimes a bone or muscle graft is needed to fill in the open space. This promotes growth of new tissues and better blood flow to the area. PREVENTION   Clean and disinfect wounds quickly to help prevent the start of a bone or joint infection. Get treatment for any infections to prevent spread to a bone or joint.  Do not smoke. Smoking decreases healing rates of bone and predisposes to  infection.  When given medications that suppress your immune system, use them according to your caregiver's instructions. Do not take more than prescribed for your condition.  Take good care of your feet and skin, especially if you have diabetes, decreased sensation or circulation problems. SEEK IMMEDIATE MEDICAL CARE IF:   You cannot bear weight on a leg or use an arm, especially following a minor injury. This can be a sign of bone or joint infection.  You think you may have signs or symptoms of a bone or joint infection. Your chance of getting rid of an infection is better if treated early. Document Released: 11/03/2005 Document Revised: 01/26/2012 Document Reviewed: 10/03/2009 [facility] Patient Information 2015 [place], [place]. This information is not intended to replace advice given to you by your health care provider. Make sure you discuss any questions you have with your health care provider.

## 2014-07-24 LAB — BODY FLUID CULTURE
CULTURE: NO GROWTH
Special Requests: NORMAL

## 2014-07-25 ENCOUNTER — Encounter (HOSPITAL_COMMUNITY): Payer: Self-pay | Admitting: Orthopaedic Surgery

## 2014-07-26 LAB — ANAEROBIC CULTURE

## 2014-08-14 NOTE — Discharge Summary (Signed)
Physician Discharge Summary  Patient ID: Edwin Wright MRN: [id number] DOB/AGE: March 01, 1951 63 y.o.  Admit date: 07/20/2014 Discharge date: 07/23/2014  Admission Diagnoses:  Joint infection of left wrist  Discharge Diagnoses:  Principal Problem:   Joint infection of left wrist   Past Medical History  Diagnosis Date  . Hypertension   . Glaucoma     Surgeries: Procedure(s): ARTHROTOMY; IRRIGATION AND DRAINAGE OF LEFT WRIST INFECTION on 07/20/2014 - 07/21/2014   Consultants (if any):  NONE  Discharged Condition: Improved  Hospital Course: Edwin Wright is an 63 y.o. male who was admitted 07/20/2014 with a diagnosis of Joint infection of left wrist and went to the operating room on 07/20/2014 - 07/21/2014 and underwent the above named procedures.    He was given perioperative antibiotics:      Anti-infectives   Start     Dose/Rate Route Frequency Ordered Stop   07/23/14 1600  vancomycin (VANCOCIN) IVPB 1000 mg/200 mL premix  Status:  Discontinued     1,000 mg 200 mL/hr over 60 Minutes Intravenous Every 24 hours 07/22/14 1546 07/23/14 1119   07/23/14 1100  doxycycline (VIBRA-TABS) tablet 100 mg  Status:  Discontinued     100 mg Oral Every 12 hours 07/23/14 1027 07/23/14 1711   07/23/14 0000  doxycycline (VIBRA-TABS) 100 MG tablet     100 mg Oral Every 12 hours 07/23/14 1030     07/22/14 1200  vancomycin (VANCOCIN) 1,500 mg in sodium chloride 0.9 % 500 mL IVPB  Status:  Discontinued     1,500 mg 250 mL/hr over 120 Minutes Intravenous Every 24 hours 07/21/14 1217 07/22/14 1546   07/21/14 1200  [MAR Hold]  vancomycin (VANCOCIN) IVPB 750 mg/150 ml premix  Status:  Discontinued     (On MAR Hold since 07/21/14 0216)   750 mg 150 mL/hr over 60 Minutes Intravenous Every 12 hours 07/21/14 0152 07/21/14 1201   07/21/14 1200  vancomycin (VANCOCIN) IVPB 750 mg/150 ml premix  Status:  Discontinued     750 mg 150 mL/hr over 60 Minutes Intravenous Every 12 hours 07/21/14 1003 07/21/14 1217   07/21/14  1100  [MAR Hold]  piperacillin-tazobactam (ZOSYN) IVPB 3.375 g  Status:  Discontinued     (On MAR Hold since 07/21/14 0216)   3.375 g 12.5 mL/hr over 240 Minutes Intravenous 3 times per day 07/21/14 0153 07/21/14 1201   07/21/14 1100  piperacillin-tazobactam (ZOSYN) IVPB 3.375 g  Status:  Discontinued     3.375 g 12.5 mL/hr over 240 Minutes Intravenous 3 times per day 07/21/14 1003 07/23/14 1119   07/21/14 0200  [MAR Hold]  piperacillin-tazobactam (ZOSYN) IVPB 3.375 g     (On MAR Hold since 07/21/14 0216)   3.375 g 100 mL/hr over 30 Minutes Intravenous To Post Anesthesia Care Unit 07/21/14 0153 07/21/14 0349   07/21/14 0000  [MAR Hold]  vancomycin (VANCOCIN) 1,250 mg in sodium chloride 0.9 % 250 mL IVPB     (On MAR Hold since 07/21/14 0043)   1,250 mg 166.7 mL/hr over 90 Minutes Intravenous  Once 07/20/14 2323 07/21/14 0127     He was given sequential compression devices, early ambulation for DVT prophylaxis.  He benefited maximally from the hospital stay and there were no complications.    Plan discharge home on doxycycline  po bid. All cultures neg. Most common organism would be Staph. Office 3 to 5 days. Discussed with ID Dr. Ninetta Lights plan and doxy and he agrees.   Recent vital signs:  Filed Vitals:  07/23/14 0800  BP:   Pulse:   Temp: 98.1 F (36.7 C)  Resp:     Recent laboratory studies:  Lab Results  Component Value Date   HGB 12.4* 07/21/2014   HGB 12.1* 08-09-2014   Lab Results  Component Value Date   WBC 10.9* 07/21/2014   PLT 338 07/21/2014   No results found for this basename: INR   Lab Results  Component Value Date   NA 136* 08/09/14   K 3.8 08-09-2014   CL 100 August 09, 2014   CO2 22 08-09-2014   BUN 10 08/09/14   CREATININE 0.93 2014/08/09   GLUCOSE 135* 2014/08/09    Discharge Medications:     Medication List    STOP taking these medications       latanoprost 0.005 % ophthalmic solution  Commonly known as:  XALATAN      TAKE these medications        amLODipine 10 MG tablet  Commonly known as:  NORVASC  Take 10 mg by mouth daily.     COMBIGAN 0.2-0.5 % ophthalmic solution  Generic drug:  brimonidine-timolol  Place 1 drop into both eyes every 12 (twelve) hours.     dorzolamide 2 % ophthalmic solution  Commonly known as:  TRUSOPT  Place 1 drop into both eyes 2 (two) times daily.     doxycycline 100 MG tablet  Commonly known as:  VIBRA-TABS  Take 1 tablet (100 mg total) by mouth every 12 (twelve) hours.     HYDROcodone-acetaminophen 5-325 MG per tablet  Commonly known as:  NORCO  Take 1 tablet by mouth every 4 (four) hours as needed for moderate pain.        Diagnostic Studies: Dg Wrist Complete Left  08-09-14   CLINICAL DATA:  Pain and swelling about left wrist  EXAM: LEFT WRIST - COMPLETE 3+ VIEW  COMPARISON:  None.  FINDINGS: No acute fracture. No dislocation. There are cystic areas and erosions in the proximal scaphoid. There is also calcification within the soft tissues dorsal to the proximal carpal row, node on the lateral view. 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 Joint spaces are maintained. Bone mineral density is within normal limits.  IMPRESSION: No acute bony injury.  Findings suggestive of CPPD. Septic arthritis is not entirely excluded. Correlate clinically.   Electronically Signed   By: Maryclare Bean M.D.   On: 08-09-2014 19:47    Disposition: 01-Home or Self Care  DISCHARGE INSTRUCTIONS: Keep wounds dry and clean at all times.  Elevation of hand to prevent edema and continue exercises as taught by OT.   Follow-up Information   Follow up with [facility]. (Someone from [facility] will contact you concerning start date and time for therapy.)    Contact information:   [address] [place] [address] [phone]       Follow up with Eldred Manges, MD In 4 days.   Specialty:  Orthopedic Surgery   Contact information:   [address] [place] [address] [phone]        Signed: Wende Neighbors 08/14/2014, 10:57 AM

## 2016-11-02 ENCOUNTER — Encounter (HOSPITAL_COMMUNITY): Payer: Self-pay

## 2016-11-02 ENCOUNTER — Emergency Department (HOSPITAL_COMMUNITY): Payer: Medicare Other

## 2016-11-02 ENCOUNTER — Inpatient Hospital Stay (HOSPITAL_COMMUNITY)
Admission: EM | Admit: 2016-11-02 | Discharge: 2016-11-11 | DRG: 871 | Disposition: A | Discharge status: Skilled Nursing Facility | Payer: Medicare Other | Attending: Internal Medicine | Admitting: Internal Medicine

## 2016-11-02 DIAGNOSIS — J101 Influenza due to other identified influenza virus with other respiratory manifestations: Secondary | ICD-10-CM | POA: Diagnosis not present

## 2016-11-02 DIAGNOSIS — E43 Unspecified severe protein-calorie malnutrition: Secondary | ICD-10-CM | POA: Diagnosis present

## 2016-11-02 DIAGNOSIS — F4321 Adjustment disorder with depressed mood: Secondary | ICD-10-CM | POA: Diagnosis present

## 2016-11-02 DIAGNOSIS — H409 Unspecified glaucoma: Secondary | ICD-10-CM | POA: Diagnosis not present

## 2016-11-02 DIAGNOSIS — Z8249 Family history of ischemic heart disease and other diseases of the circulatory system: Secondary | ICD-10-CM

## 2016-11-02 DIAGNOSIS — Z515 Encounter for palliative care: Secondary | ICD-10-CM | POA: Diagnosis not present

## 2016-11-02 DIAGNOSIS — E876 Hypokalemia: Secondary | ICD-10-CM

## 2016-11-02 DIAGNOSIS — J09X2 Influenza due to identified novel influenza A virus with other respiratory manifestations: Secondary | ICD-10-CM | POA: Diagnosis not present

## 2016-11-02 DIAGNOSIS — M545 Low back pain: Secondary | ICD-10-CM | POA: Diagnosis not present

## 2016-11-02 DIAGNOSIS — H9190 Unspecified hearing loss, unspecified ear: Secondary | ICD-10-CM | POA: Diagnosis present

## 2016-11-02 DIAGNOSIS — R740 Nonspecific elevation of levels of transaminase and lactic acid dehydrogenase [LDH]: Secondary | ICD-10-CM | POA: Diagnosis present

## 2016-11-02 DIAGNOSIS — Z833 Family history of diabetes mellitus: Secondary | ICD-10-CM

## 2016-11-02 DIAGNOSIS — H547 Unspecified visual loss: Secondary | ICD-10-CM | POA: Diagnosis present

## 2016-11-02 DIAGNOSIS — R6510 Systemic inflammatory response syndrome (SIRS) of non-infectious origin without acute organ dysfunction: Secondary | ICD-10-CM | POA: Diagnosis not present

## 2016-11-02 DIAGNOSIS — D509 Iron deficiency anemia, unspecified: Secondary | ICD-10-CM | POA: Diagnosis present

## 2016-11-02 DIAGNOSIS — A419 Sepsis, unspecified organism: Principal | ICD-10-CM | POA: Diagnosis present

## 2016-11-02 DIAGNOSIS — D649 Anemia, unspecified: Secondary | ICD-10-CM | POA: Diagnosis present

## 2016-11-02 DIAGNOSIS — F17210 Nicotine dependence, cigarettes, uncomplicated: Secondary | ICD-10-CM | POA: Diagnosis present

## 2016-11-02 DIAGNOSIS — Z681 Body mass index (BMI) 19 or less, adult: Secondary | ICD-10-CM | POA: Diagnosis not present

## 2016-11-02 DIAGNOSIS — J102 Influenza due to other identified influenza virus with gastrointestinal manifestations: Secondary | ICD-10-CM | POA: Diagnosis present

## 2016-11-02 DIAGNOSIS — N179 Acute kidney failure, unspecified: Secondary | ICD-10-CM | POA: Diagnosis present

## 2016-11-02 DIAGNOSIS — I10 Essential (primary) hypertension: Secondary | ICD-10-CM | POA: Diagnosis not present

## 2016-11-02 DIAGNOSIS — Z66 Do not resuscitate: Secondary | ICD-10-CM | POA: Diagnosis not present

## 2016-11-02 DIAGNOSIS — R7989 Other specified abnormal findings of blood chemistry: Secondary | ICD-10-CM | POA: Diagnosis present

## 2016-11-02 DIAGNOSIS — E0590 Thyrotoxicosis, unspecified without thyrotoxic crisis or storm: Secondary | ICD-10-CM | POA: Diagnosis present

## 2016-11-02 DIAGNOSIS — E860 Dehydration: Secondary | ICD-10-CM | POA: Diagnosis present

## 2016-11-02 DIAGNOSIS — I700 Atherosclerosis of aorta: Secondary | ICD-10-CM | POA: Diagnosis present

## 2016-11-02 DIAGNOSIS — R509 Fever, unspecified: Secondary | ICD-10-CM

## 2016-11-02 HISTORY — DX: Unspecified hearing loss, unspecified ear: H91.90

## 2016-11-02 LAB — CBC WITH DIFFERENTIAL/PLATELET
BASOS ABS: 0.1 10*3/uL (ref 0.0–0.1)
BASOS PCT: 1 %
EOS PCT: 0 %
Eosinophils Absolute: 0 10*3/uL (ref 0.0–0.7)
HEMATOCRIT: 36.8 % — AB (ref 39.0–52.0)
Hemoglobin: 12.5 g/dL — ABNORMAL LOW (ref 13.0–17.0)
LYMPHS PCT: 11 %
Lymphs Abs: 1.1 10*3/uL (ref 0.7–4.0)
MCH: 31.2 pg (ref 26.0–34.0)
MCHC: 34 g/dL (ref 30.0–36.0)
MCV: 91.8 fL (ref 78.0–100.0)
MONO ABS: 1.2 10*3/uL — AB (ref 0.1–1.0)
MONOS PCT: 12 %
NEUTROS ABS: 7.3 10*3/uL (ref 1.7–7.7)
Neutrophils Relative %: 76 %
PLATELETS: 296 10*3/uL (ref 150–400)
RBC: 4.01 MIL/uL — ABNORMAL LOW (ref 4.22–5.81)
RDW: 12.3 % (ref 11.5–15.5)
WBC: 9.6 10*3/uL (ref 4.0–10.5)

## 2016-11-02 LAB — URINALYSIS, ROUTINE W REFLEX MICROSCOPIC
Bilirubin Urine: NEGATIVE
Glucose, UA: NEGATIVE mg/dL
Ketones, ur: 5 mg/dL — AB
LEUKOCYTES UA: NEGATIVE
NITRITE: NEGATIVE
Protein, ur: 30 mg/dL — AB
SPECIFIC GRAVITY, URINE: 1.018 (ref 1.005–1.030)
Squamous Epithelial / LPF: NONE SEEN
pH: 5 (ref 5.0–8.0)

## 2016-11-02 LAB — INFLUENZA PANEL BY PCR (TYPE A & B)
INFLAPCR: POSITIVE — AB
INFLBPCR: NEGATIVE

## 2016-11-02 LAB — COMPREHENSIVE METABOLIC PANEL
ALT: 16 U/L — ABNORMAL LOW (ref 17–63)
ANION GAP: 11 (ref 5–15)
AST: 49 U/L — ABNORMAL HIGH (ref 15–41)
Albumin: 4 g/dL (ref 3.5–5.0)
Alkaline Phosphatase: 70 U/L (ref 38–126)
BUN: 23 mg/dL — ABNORMAL HIGH (ref 6–20)
CHLORIDE: 100 mmol/L — AB (ref 101–111)
CO2: 23 mmol/L (ref 22–32)
Calcium: 9.2 mg/dL (ref 8.9–10.3)
Creatinine, Ser: 1.44 mg/dL — ABNORMAL HIGH (ref 0.61–1.24)
GFR calc Af Amer: 57 mL/min — ABNORMAL LOW (ref >60)
GFR calc non Af Amer: 49 mL/min — ABNORMAL LOW (ref >60)
Glucose, Bld: 106 mg/dL — ABNORMAL HIGH (ref 65–99)
POTASSIUM: 3.3 mmol/L — AB (ref 3.5–5.1)
Sodium: 134 mmol/L — ABNORMAL LOW (ref 135–145)
Total Bilirubin: 1.2 mg/dL (ref 0.3–1.2)
Total Protein: 8 g/dL (ref 6.5–8.1)

## 2016-11-02 LAB — I-STAT CG4 LACTIC ACID, ED: LACTIC ACID, VENOUS: 1.4 mmol/L (ref 0.5–1.9)

## 2016-11-02 LAB — LIPASE, BLOOD: LIPASE: 33 U/L (ref 11–51)

## 2016-11-02 LAB — I-STAT TROPONIN, ED: TROPONIN I, POC: 0.02 ng/mL (ref 0.00–0.08)

## 2016-11-02 LAB — CBG MONITORING, ED: GLUCOSE-CAPILLARY: 98 mg/dL (ref 65–99)

## 2016-11-02 LAB — PROCALCITONIN: Procalcitonin: 0.3 ng/mL

## 2016-11-02 MED ORDER — BRIMONIDINE TARTRATE 0.2 % OP SOLN
1.0000 [drp] | Freq: Three times a day (TID) | OPHTHALMIC | Status: DC
  Administered 2016-11-02 – 2016-11-11 (×26): 1 [drp] via OPHTHALMIC
  Filled 2016-11-02: qty 5

## 2016-11-02 MED ORDER — SODIUM CHLORIDE 0.9 % IJ SOLN
INTRAMUSCULAR | Status: AC
  Filled 2016-11-02: qty 50

## 2016-11-02 MED ORDER — LATANOPROST 0.005 % OP SOLN
1.0000 [drp] | Freq: Every day | OPHTHALMIC | Status: DC
  Administered 2016-11-02 – 2016-11-10 (×9): 1 [drp] via OPHTHALMIC
  Filled 2016-11-02 (×2): qty 2.5

## 2016-11-02 MED ORDER — ACETAMINOPHEN 325 MG PO TABS
650.0000 mg | ORAL_TABLET | Freq: Four times a day (QID) | ORAL | Status: DC | PRN
  Administered 2016-11-03 – 2016-11-11 (×2): 650 mg via ORAL
  Filled 2016-11-02 (×2): qty 2

## 2016-11-02 MED ORDER — IOPAMIDOL (ISOVUE-300) INJECTION 61%
INTRAVENOUS | Status: AC
  Administered 2016-11-02: 75 mL via INTRAVENOUS
  Filled 2016-11-02: qty 100

## 2016-11-02 MED ORDER — SODIUM CHLORIDE 0.9 % IV BOLUS (SEPSIS)
500.0000 mL | Freq: Once | INTRAVENOUS | Status: AC
  Administered 2016-11-02: 500 mL via INTRAVENOUS

## 2016-11-02 MED ORDER — ONDANSETRON HCL 4 MG PO TABS: 4.0000 mg | ORAL_TABLET | Freq: Four times a day (QID) | ORAL | Status: DC | PRN

## 2016-11-02 MED ORDER — DORZOLAMIDE HCL 2 % OP SOLN
1.0000 [drp] | Freq: Two times a day (BID) | OPHTHALMIC | Status: DC
  Administered 2016-11-02 – 2016-11-11 (×18): 1 [drp] via OPHTHALMIC
  Filled 2016-11-02: qty 10

## 2016-11-02 MED ORDER — ACETAMINOPHEN 650 MG RE SUPP: 650.0000 mg | Freq: Four times a day (QID) | RECTAL | Status: DC | PRN

## 2016-11-02 MED ORDER — IOPAMIDOL (ISOVUE-300) INJECTION 61%
INTRAVENOUS | Status: AC
  Administered 2016-11-02: 30 mL via ORAL
  Filled 2016-11-02: qty 30

## 2016-11-02 MED ORDER — ENOXAPARIN SODIUM 30 MG/0.3ML ~~LOC~~ SOLN
30.0000 mg | Freq: Every day | SUBCUTANEOUS | Status: DC
  Administered 2016-11-02: 30 mg via SUBCUTANEOUS
  Filled 2016-11-02: qty 0.3

## 2016-11-02 MED ORDER — SODIUM CHLORIDE 0.9 % IV SOLN
INTRAVENOUS | Status: AC
  Administered 2016-11-02: 23:00:00 via INTRAVENOUS

## 2016-11-02 MED ORDER — ONDANSETRON HCL 4 MG/2ML IJ SOLN
4.0000 mg | Freq: Four times a day (QID) | INTRAMUSCULAR | Status: DC | PRN
  Administered 2016-11-04 – 2016-11-07 (×4): 4 mg via INTRAVENOUS
  Filled 2016-11-02 (×4): qty 2

## 2016-11-02 MED ORDER — ACETAMINOPHEN 650 MG RE SUPP
650.0000 mg | Freq: Once | RECTAL | Status: AC
  Administered 2016-11-02: 650 mg via RECTAL
  Filled 2016-11-02: qty 1

## 2016-11-02 MED ORDER — HYDROCODONE-ACETAMINOPHEN 5-325 MG PO TABS: 1.0000 | ORAL_TABLET | ORAL | Status: DC | PRN

## 2016-11-02 NOTE — ED Notes (Signed)
BLOOD CULTURE OBTAINED EACH RT FOREARM

## 2016-11-02 NOTE — ED Notes (Signed)
[facility] [phone](cell) Edwin Wright- [phone](cell)

## 2016-11-02 NOTE — ED Notes (Signed)
Admitting MD present updating on pt current status. Wants pt held in ED until CT scan results

## 2016-11-02 NOTE — ED Triage Notes (Signed)
Pt arrived via [facility] from [facility]. Per nursing facility, pt showing symptoms of UTI and they requested a urine sample be ran. Specifically, pt c/o lower back pain, fever, and chills. Pt A&Ox4, but HOH and visually impaired. Pt unsteady on his feet.

## 2016-11-02 NOTE — ED Notes (Signed)
[facility] Expected date:  Expected time:  Means of arrival:  Comments: EMS- To move to Room 6

## 2016-11-02 NOTE — ED Provider Notes (Signed)
[facility]-EMERGENCY DEPT Provider Note   CSN: [id number] Arrival date & time: 11/02/16  1400     History   Chief Complaint Chief Complaint  Patient presents with  . Chills  . Back Pain    Lower    HPI Edwin Wright is a 65 y.o. male.  HPI Low 5 caveat. History provided by patient's wife. Patient is hard of hearing and visually impaired. Wife states patient is a resident of assisted living facility and for the last 2 days he has not been eating or drinking. He has been less active than he normally is. Increasing shaking chills and patient was complaining of lower back pain yesterday. Wife also notes patient has had increased urinary frequency. No vomiting or diarrhea. Questionable cough.  Past Medical History:  Diagnosis Date  . Glaucoma   . Hypertension     Patient Active Problem List   Diagnosis Date Noted  . Hypertension 11/02/2016  . SIRS (systemic inflammatory response syndrome) (HCC) 11/02/2016  . Dehydration 11/02/2016  . Glaucoma 11/02/2016  . ARF (acute renal failure) (HCC) 11/02/2016  . Anemia 11/02/2016  . 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    Past Surgical History:  Procedure Laterality Date  . ARTHROTOMY Left 07/21/2014   Procedure: ARTHROTOMY; IRRIGATION AND DRAINAGE OF LEFT WRIST INFECTION;  Surgeon: Eldred Manges, MD;  Location: [facility] OR;  Service: Orthopedics;  Laterality: Left;       Home Medications    Prior to Admission medications   Medication Sig Start Date End Date Taking? Authorizing Provider  amLODipine (NORVASC) 10 MG tablet Take 10 mg by mouth daily.   Yes Historical Provider, MD  brimonidine (ALPHAGAN) 0.2 % ophthalmic solution Place 1 drop into the left eye 3 (three) times daily.   Yes Historical Provider, MD  dorzolamide (TRUSOPT) 2 % ophthalmic solution Place 1 drop into both eyes 2 (two) times daily.   Yes Historical Provider, MD  hydrochlorothiazide (HYDRODIURIL) 25 MG tablet Take 25 mg by mouth daily.   Yes Historical Provider,  MD  latanoprost (XALATAN) 0.005 % ophthalmic solution Place 1 drop into both eyes at bedtime.   Yes Historical Provider, MD    Family History Family History  Problem Relation Age of Onset  . Breast cancer Sister   . Cancer Sister   . Diabetes Sister   . Hypertension Sister   . Diabetes Brother   . CAD Neg Hx   . Stroke Neg Hx     Social History Social History  Substance Use Topics  . Smoking status: Current Every Day Smoker    Types: Cigarettes  . Smokeless tobacco: Never Used  . Alcohol use No     Allergies   Patient has no known allergies.   Review of Systems Review of Systems  Unable to perform ROS: Dementia  Constitutional: Positive for activity change, appetite change and chills.  Respiratory: Positive for cough.   Musculoskeletal: Positive for back pain.  Psychiatric/Behavioral: Positive for confusion.     Physical Exam Updated Vital Signs BP (!) 119/55 (BP Location: Left Arm) Comment: nurse notified  Pulse 92   Temp 98.9 F (37.2 C) (Oral)   Resp 26   Ht 5\' 10"  (1.778 m)   Wt 120 lb (54.4 kg)   SpO2 94%   BMI 17.22 kg/m   Physical Exam  Constitutional: He appears well-developed and well-nourished.  HENT:  Head: Normocephalic and atraumatic.  Mouth/Throat: Oropharynx is clear and moist. No oropharyngeal exudate.  Eyes: EOM are normal.  Irregular right pupil consistent with previous history of cataract surgery.  Neck: Normal range of motion. Neck supple. No JVD present.  Cardiovascular: Normal rate and regular rhythm.  Exam reveals no gallop and no friction rub.   No murmur heard. Pulmonary/Chest: He exhibits no tenderness.  Diminished breath sounds in the right base. Patient has increased work of breathing  Abdominal: Soft. Bowel sounds are normal. There is no tenderness. There is no rebound and no guarding.  Musculoskeletal: Normal range of motion. He exhibits no edema or tenderness.  No apparent CVA tenderness. No lower extremity swelling,  asymmetry or tenderness. Distal pulses are intact.  Neurological: He is alert.  Patient follow simple commands. He is nonverbal. Moving all extremities without focal deficit.  Skin: Skin is warm and dry. Capillary refill takes less than 2 seconds. No rash noted. No erythema.  Nursing note and vitals reviewed.    ED Treatments / Results  Labs (all labs ordered are listed, but only abnormal results are displayed) Labs Reviewed  URINALYSIS, ROUTINE W REFLEX MICROSCOPIC - Abnormal; Notable for the following:       Result Value   APPearance HAZY (*)    Hgb urine dipstick MODERATE (*)    Ketones, ur 5 (*)    Protein, ur 30 (*)    Bacteria, UA RARE (*)    All other components within normal limits  CBC WITH DIFFERENTIAL/PLATELET - Abnormal; Notable for the following:    RBC 4.01 (*)    Hemoglobin 12.5 (*)    HCT 36.8 (*)    Monocytes Absolute 1.2 (*)    All other components within normal limits  COMPREHENSIVE METABOLIC PANEL - Abnormal; Notable for the following:    Sodium 134 (*)    Potassium 3.3 (*)    Chloride 100 (*)    Glucose, Bld 106 (*)    BUN 23 (*)    Creatinine, Ser 1.44 (*)    AST 49 (*)    ALT 16 (*)    GFR calc non Af Amer 49 (*)    GFR calc Af Amer 57 (*)    All other components within normal limits  INFLUENZA PANEL BY PCR (TYPE A & B, H1N1) - Abnormal; Notable for the following:    Influenza A By PCR POSITIVE (*)    All other components within normal limits  CULTURE, BLOOD (ROUTINE X 2)  CULTURE, BLOOD (ROUTINE X 2)  URINE CULTURE  RESPIRATORY PANEL BY PCR  MRSA PCR SCREENING  LIPASE, BLOOD  PROCALCITONIN  VITAMIN B12  FOLATE  IRON AND TIBC  FERRITIN  RETICULOCYTES  MAGNESIUM  PHOSPHORUS  TSH  COMPREHENSIVE METABOLIC PANEL  CBC  SODIUM, URINE, RANDOM  CREATININE, URINE, RANDOM  CBG MONITORING, ED  I-STAT CG4 LACTIC ACID, ED  I-STAT TROPOININ, ED    EKG  EKG Interpretation None       Radiology Ct Abdomen Pelvis W Contrast  Result Date:  11/02/2016 CLINICAL DATA:  Fever, chills, and low back pain. EXAM: CT ABDOMEN AND PELVIS WITH CONTRAST TECHNIQUE: Multidetector CT imaging of the abdomen and pelvis was performed using the standard protocol following bolus administration of intravenous contrast. CONTRAST:  75 mL Isovue-300 COMPARISON:  None. FINDINGS: Lower chest: The minimal dependent atelectasis in the lung bases. No pleural effusion. Hepatobiliary: No focal liver abnormality is seen. No gallstones, gallbladder wall thickening, or biliary dilatation. Pancreas: Unremarkable. Spleen: Unremarkable. Adrenals/Urinary Tract: Unremarkable adrenal glands. Small low-density renal lesions bilaterally compatible with cysts, the largest measuring 2.1 cm  in the interpolar left kidney. No evidence of renal calculi or hydronephrosis. Unremarkable bladder. Stomach/Bowel: The stomach is within normal limits. There is mild gaseous distension of much of the colon, and there is a small volume of liquid stool scattered throughout the colon including in the rectosigmoid region. Oral contrast is present in nondilated loops of small bowel without evidence of obstruction. The appendix is unremarkable. Vascular/Lymphatic: Abdominal aortic atherosclerosis without aneurysm. No enlarged lymph nodes. Reproductive: Unremarkable prostate. Other: No intraperitoneal free fluid. No abdominal wall mass or hernia. Musculoskeletal: Advanced disc degeneration L2-L5. IMPRESSION: 1. Mild gaseous distention of the colon with liquid stool which may reflect a diarrheal illness. 2. No other acute abnormality identified in the abdomen or pelvis. 3. Aortic atherosclerosis. Electronically Signed   By: Sebastian AcheAllen  Grady M.D.   On: 11/02/2016 21:14   Dg Chest Port 1 View  Result Date: 11/02/2016 CLINICAL DATA:  Short of breath, cough, chills, low back pain, and fever. EXAM: PORTABLE CHEST 1 VIEW COMPARISON:  None. FINDINGS: The cardiomediastinal silhouette is within normal limits. The lungs are  normally to mildly hyperinflated without evidence of airspace consolidation, edema, pleural effusion, or pneumothorax. No acute osseous abnormality is identified. IMPRESSION: No active disease. Electronically Signed   By: Sebastian AcheAllen  Grady M.D.   On: 11/02/2016 15:09    Procedures Procedures (including critical care time)  Medications Ordered in ED Medications  sodium chloride 0.9 % injection (  Not Given 11/02/16 1915)  brimonidine (ALPHAGAN) 0.2 % ophthalmic solution 1 drop (1 drop Left Eye Given 11/02/16 2304)  latanoprost (XALATAN) 0.005 % ophthalmic solution 1 drop (1 drop Both Eyes Given 11/02/16 2304)  dorzolamide (TRUSOPT) 2 % ophthalmic solution 1 drop (1 drop Both Eyes Given 11/02/16 2304)  acetaminophen (TYLENOL) tablet 650 mg (not administered)    Or  acetaminophen (TYLENOL) suppository 650 mg (not administered)  HYDROcodone-acetaminophen (NORCO/VICODIN) 5-325 MG per tablet 1-2 tablet (not administered)  ondansetron (ZOFRAN) tablet 4 mg (not administered)    Or  ondansetron (ZOFRAN) injection 4 mg (not administered)  enoxaparin (LOVENOX) injection 30 mg (30 mg Subcutaneous Given 11/02/16 2302)  0.9 %  sodium chloride infusion ( Intravenous New Bag/Given 11/02/16 2305)  sodium chloride 0.9 % bolus 500 mL (0 mLs Intravenous Stopped 11/02/16 1819)  sodium chloride 0.9 % bolus 500 mL (500 mLs Intravenous New Bag/Given 11/02/16 1735)  acetaminophen (TYLENOL) suppository 650 mg (650 mg Rectal Given 11/02/16 1825)  iopamidol (ISOVUE-300) 61 % injection (30 mLs Oral Contrast Given 11/02/16 2026)  iopamidol (ISOVUE-300) 61 % injection (75 mLs Intravenous Contrast Given 11/02/16 2026)     Initial Impression / Assessment and Plan / ED Course  I have reviewed the triage vital signs and the nursing notes.  Pertinent labs & imaging results that were available during my care of the patient were reviewed by me and considered in my medical decision making (see chart for details).  Clinical  Course    Discussed with [facility] hospitalist. Will see in ED and admit. Asked to have CT abd performed in ED.    Final Clinical Impressions(s) / ED Diagnoses   Final diagnoses:  Fever and chills    New Prescriptions Current Discharge Medication List       Loren Raceravid Sireen Halk, MD 11/02/16 [phone]

## 2016-11-02 NOTE — Progress Notes (Signed)
Pt arrived to room 1502: Pt alert and oriented x2. Pt has intact skin, no edema. Hearing aids were taken out per pt request and he placed in a white storage container and nurse placed in his belongings bag per pt request. No family at bedside. Oriented to bed use, call bell and unit. Will review orders and continue to monitor.

## 2016-11-02 NOTE — H&P (Addendum)
Edwin Wright WUJ:[id number] DOB: 1951/03/16 DOA: 11/02/2016     PCP: Billee Cashing, MD   Outpatient Specialists: Orthopedics [facility] Greening Patient coming from:  From facility [facility] Complaint: Decreased by mouth intake fevers chills and back pain  HPI: Edwin Wright is a 65 y.o. male with medical history significant of HTN, Left wrist join tinfection,   hard of hearing, glaucoma vision impairments    Presented with 2 day history of decreased by mouth intake last active shaking chills lower back pain since yesterday. Increased urinary frequency not associated nausea or vomiting question may be had a cough. Wife reports mild confusion patient at baseline is poorly verbal sister states that he always been quiet but for the past 3 months he hasn't been verbalizing much she thinks is secondary to depression per family been loosing weight.  Patient himself was not providing any history. Family denies history of dementia states that patient understands questions but simply refuses or unable to answer this has been a progressive change her past months. Regarding pertinent Chronic problems: has hypertension well controlled. He has severe glaucoma lost vision since this Summer. He has been at assisted living since last August.    IN ER:  Temp (24hrs), Avg:101.7 F (38.7 C), Min:98.7 F (37.1 C), Max:102.9 F (39.4 C)  RR 22 oxygen saturation 95% on room air HR 100 BP down to 110/65 Lactic acid 1.4 WBC 9.6 hemoglobin 12.5 Sodium 134 potassium 3.3 creatinine 1.44 which is up from baseline of 0.93  CXR nonacute Following Medications were ordered in ER: Medications  sodium chloride 0.9 % bolus 500 mL (0 mLs Intravenous Stopped 11/02/16 1819)  sodium chloride 0.9 % bolus 500 mL (500 mLs Intravenous New Bag/Given 11/02/16 1735)  acetaminophen (TYLENOL) suppository 650 mg (650 mg Rectal Given 11/02/16 1825)     Hospitalist was called for admission for Dehydration and  acute renal failure and  sirs  Review of Systems:    Pertinent positives include:  Fevers, chills, fatigue, weight loss , poor appetite  Constitutional:  No weight loss, night sweats, HEENT:  No headaches, Difficulty swallowing,Tooth/dental problems,Sore throat,  No sneezing, itching, ear ache, nasal congestion, post nasal drip,  Cardio-vascular:  No chest pain, Orthopnea, PND, anasarca, dizziness, palpitations.no Bilateral lower extremity swelling  GI:  No heartburn, indigestion, abdominal pain, nausea, vomiting, diarrhea, change in bowel habits, loss of appetite, melena, blood in stool, hematemesis Resp:  no shortness of breath at rest. No dyspnea on exertion, No excess mucus, no productive cough, No non-productive cough, No coughing up of blood.No change in color of mucus.No wheezing. Skin:  no rash or lesions. No jaundice GU:  no dysuria, change in color of urine, no urgency or frequency. No straining to urinate.  No flank pain.  Musculoskeletal:  No joint pain or no joint swelling. No decreased range of motion. No back pain.  Psych:  No change in mood or affect. No depression or anxiety. No memory loss.  Neuro: no localizing neurological complaints, no tingling, no weakness, no double vision, no gait abnormality, no slurred speech, no confusion  As per HPI otherwise 10 point review of systems negative.   Past Medical History: Past Medical History:  Diagnosis Date  . Glaucoma   . Hypertension    Past Surgical History:  Procedure Laterality Date  . ARTHROTOMY Left 07/21/2014   Procedure: ARTHROTOMY; IRRIGATION AND DRAINAGE OF LEFT WRIST INFECTION;  Surgeon: Eldred Manges, MD;  Location: [facility] OR;  Service: Orthopedics;  Laterality: Left;     Social History:  Ambulatory walker     reports that he has been smoking Cigarettes.  He does not have any smokeless tobacco history on file. He reports that he does not drink alcohol or use drugs.  Allergies:  No Known  Allergies     Family History:  Family History  Problem Relation Age of Onset  . Breast cancer Sister   . Cancer Sister   . Diabetes Sister   . Hypertension Sister   . Diabetes Brother   . CAD Neg Hx   . Stroke Neg Hx     Medications: Prior to Admission medications   Medication Sig Start Date End Date Taking? Authorizing Provider  amLODipine (NORVASC) 10 MG tablet Take 10 mg by mouth daily.   Yes Historical Provider, MD  brimonidine (ALPHAGAN) 0.2 % ophthalmic solution Place 1 drop into the left eye 3 (three) times daily.   Yes Historical Provider, MD  dorzolamide (TRUSOPT) 2 % ophthalmic solution Place 1 drop into both eyes 2 (two) times daily.   Yes Historical Provider, MD  hydrochlorothiazide (HYDRODIURIL) 25 MG tablet Take 25 mg by mouth daily.   Yes Historical Provider, MD  latanoprost (XALATAN) 0.005 % ophthalmic solution Place 1 drop into both eyes at bedtime.   Yes Historical Provider, MD    Physical Exam: Patient Vitals for the past 24 hrs:  BP Temp Temp src Pulse Resp SpO2 Height Weight  11/02/16 1800 135/66 - - 100 22 95 % - -  11/02/16 1730 134/64 - - 96 (!) 33 94 % - -  11/02/16 1702 - 102.6 F (39.2 C) Rectal - - - - -  11/02/16 1657 - 102.6 F (39.2 C) Rectal - - - - -  11/02/16 1642 - 102.9 F (39.4 C) Oral - - - - -  11/02/16 1504 - - - - - - 5\' 10"  (1.778 m) 54.4 kg (120 lb)  11/02/16 1426 150/63 98.7 F (37.1 C) Oral 99 22 95 % - -    1. General:  in No Acute distress 2. Psychological: Alert and  Oriented to self  3. Head/ENT:   Dry Mucous Membranes                          Head Non traumatic, neck supple                           Poor Dentition 4. SKIN: decreased Skin turgor,  Skin clean Dry and intact no rash 5. Heart: Regular rate and rhythm no  Murmur, Rub or gallop 6. Lungs: Clear to auscultation bilaterally, no wheezes or crackles   7. Abdomen: Soft, RUQ tenderness, Non distended 8. Lower extremities: no clubbing, cyanosis, or edema 9.  Neurologically Grossly intact, moving all 4 extremities equally  10. MSK: Normal range of motion   body mass index is 17.22 kg/m.  Labs on Admission:   Labs on Admission: I have personally reviewed following labs and imaging studies  CBC:  Recent Labs Lab 11/02/16 1511  WBC 9.6  NEUTROABS 7.3  HGB 12.5*  HCT 36.8*  MCV 91.8  PLT 296   Basic Metabolic Panel:  Recent Labs Lab 11/02/16 1511  NA 134*  K 3.3*  CL 100*  CO2 23  GLUCOSE 106*  BUN 23*  CREATININE 1.44*  CALCIUM 9.2   GFR: Estimated Creatinine Clearance: 38.3 mL/min (by C-G formula based  on SCr of 1.44 mg/dL (H)). Liver Function Tests:  Recent Labs Lab 11/02/16 1511  AST 49*  ALT 16*  ALKPHOS 70  BILITOT 1.2  PROT 8.0  ALBUMIN 4.0    Recent Labs Lab 11/02/16 1511  LIPASE 33   No results for input(s): AMMONIA in the last 168 hours. Coagulation Profile: No results for input(s): INR, PROTIME in the last 168 hours. Cardiac Enzymes: No results for input(s): CKTOTAL, CKMB, CKMBINDEX, TROPONINI in the last 168 hours. BNP (last 3 results) No results for input(s): PROBNP in the last 8760 hours. HbA1C: No results for input(s): HGBA1C in the last 72 hours. CBG:  Recent Labs Lab 11/02/16 1544  GLUCAP 98   Lipid Profile: No results for input(s): CHOL, HDL, LDLCALC, TRIG, CHOLHDL, LDLDIRECT in the last 72 hours. Thyroid Function Tests: No results for input(s): TSH, T4TOTAL, FREET4, T3FREE, THYROIDAB in the last 72 hours. Anemia Panel: No results for input(s): VITAMINB12, FOLATE, FERRITIN, TIBC, IRON, RETICCTPCT in the last 72 hours. Urine analysis:    Component Value Date/Time   COLORURINE YELLOW 11/02/2016 1702   APPEARANCEUR HAZY (A) 11/02/2016 1702   LABSPEC 1.018 11/02/2016 1702   PHURINE 5.0 11/02/2016 1702   GLUCOSEU NEGATIVE 11/02/2016 1702   HGBUR MODERATE (A) 11/02/2016 1702   BILIRUBINUR NEGATIVE 11/02/2016 1702   KETONESUR 5 (A) 11/02/2016 1702   PROTEINUR 30 (A)  11/02/2016 1702   NITRITE NEGATIVE 11/02/2016 1702   LEUKOCYTESUR NEGATIVE 11/02/2016 1702   Sepsis Labs: @LABRCNTIP (procalcitonin:4,lacticidven:4) )No results found for this or any previous visit (from the past 240 hour(s)).     UA no evidence of UTI    No results found for: HGBA1C  Estimated Creatinine Clearance: 38.3 mL/min (by C-G formula based on SCr of 1.44 mg/dL (H)).  BNP (last 3 results) No results for input(s): PROBNP in the last 8760 hours.   ECG REPORT  Independently reviewed Rate:93  Rhythm: NSR ST&T Change: No acute ischemic changes  [id number]  Filed Weights   11/02/16 1504  Weight: 54.4 kg (120 lb)     Cultures:    Component Value Date/Time   SDES SYNOVIAL WRIST LEFT 07/20/2014 2325   SDES SYNOVIAL FLUID WRIST LEFT 07/20/2014 2325   SDES FLUID SYNOVIAL LEFT WRIST 07/20/2014 2325   SPECREQUEST Normal 07/20/2014 2325   SPECREQUEST Normal 07/20/2014 2325   SPECREQUEST ADD ON 07/21/14 AT 0015 BY MD 07/20/2014 2325   CULT  07/20/2014 2325    NO GROWTH 3 DAYS Performed at [facility]   CULT  07/20/2014 2325    NO ANAEROBES ISOLATED Performed at [facility]   REPTSTATUS 07/24/2014 FINAL 07/20/2014 2325   REPTSTATUS 07/21/2014 FINAL 07/20/2014 2325   REPTSTATUS 07/26/2014 FINAL 07/20/2014 2325     Radiological Exams on Admission: Dg Chest Port 1 View  Result Date: 11/02/2016 CLINICAL DATA:  Short of breath, cough, chills, low back pain, and fever. EXAM: PORTABLE CHEST 1 VIEW COMPARISON:  None. FINDINGS: The cardiomediastinal silhouette is within normal limits. The lungs are normally to mildly hyperinflated without evidence of airspace consolidation, edema, pleural effusion, or pneumothorax. No acute osseous abnormality is identified. IMPRESSION: No active disease. Electronically Signed   By: Sebastian Ache M.D.   On: 11/02/2016 15:09    Chart has been reviewed    Assessment/Plan  65 y.o. male with medical history significant of HTN,  Left wrist join tinfection,   hard of hearing, glaucoma vision impairments being admitted for Dehydration and acute renal failure and  sirs  Present on Admission:  . SIRS (systemic inflammatory response syndrome) (HCC) - chest x-ray and UA showing no evidence of infectious process. Given abdominal discomfort will obtain CT of abdomen patient unable to verbalize or provide much of any history. We'll test for influenza and respiratory panel no reports of diarrhea. For now will broadly cover with antibiotics and look for any other underlying source of infection. . Dehydration - administer IV fluids will have swallow evaluation performed to evaluate for any evidence of dysphasia . Glaucoma resulting in vision impaired continue home medication . ARF (acute renal failure) (HCC) - likely secondary to dehydration, check FeNA and if not improved with IVF would obtain renal US and consider renal consult.  . Hypertension given somewhat soft blood pressures will hold home medications for now . Anemia -  order anemia panel and Hemoccult stool Difficulty with verbalization this is chronic but likely would benefit from further workup including psychiatric evaluation for depression and or MRI to rule out central causes would reassess mental status once rehydrated  Other plan as per orders.  DVT prophylaxis:    Lovenox     Code Status:  FULL CODE as per patient family unable to get ahold of her wife please attempt discuss CODE STATUS again in the morning  Family Communication:   Family not  at  Bedside  plan of care was discussed with Sister over the phone  Disposition Plan:                            Back to current facility when stable                                            Would benefit from PT/OT eval prior to DC  ordered                      Social Work   Nutrition  consulted                          Consults called: none    Admission status:   inpatient     Level of care      medical floor         I have spent a total of 56 min on this admission    Willoughby Doell 11/02/2016, 7:42 PM    [facility]  Pager [phone]   after 2 AM please page floor coverage PA If 7AM-7PM, please contact the day team taking care of the patient  [email or web address]  Password [email or web address]

## 2016-11-03 ENCOUNTER — Encounter (HOSPITAL_COMMUNITY): Payer: Self-pay

## 2016-11-03 DIAGNOSIS — E876 Hypokalemia: Secondary | ICD-10-CM

## 2016-11-03 DIAGNOSIS — N179 Acute kidney failure, unspecified: Secondary | ICD-10-CM

## 2016-11-03 DIAGNOSIS — D509 Iron deficiency anemia, unspecified: Secondary | ICD-10-CM

## 2016-11-03 DIAGNOSIS — E43 Unspecified severe protein-calorie malnutrition: Secondary | ICD-10-CM | POA: Insufficient documentation

## 2016-11-03 LAB — COMPREHENSIVE METABOLIC PANEL
ALK PHOS: 59 U/L (ref 38–126)
ALT: 16 U/L — AB (ref 17–63)
AST: 56 U/L — ABNORMAL HIGH (ref 15–41)
Albumin: 3.5 g/dL (ref 3.5–5.0)
Anion gap: 13 (ref 5–15)
BUN: 25 mg/dL — ABNORMAL HIGH (ref 6–20)
CALCIUM: 8.5 mg/dL — AB (ref 8.9–10.3)
CO2: 19 mmol/L — AB (ref 22–32)
CREATININE: 1.27 mg/dL — AB (ref 0.61–1.24)
Chloride: 103 mmol/L (ref 101–111)
GFR calc Af Amer: >60 mL/min (ref >60)
GFR calc non Af Amer: 57 mL/min — ABNORMAL LOW (ref >60)
Glucose, Bld: 81 mg/dL (ref 65–99)
Potassium: 2.8 mmol/L — ABNORMAL LOW (ref 3.5–5.1)
SODIUM: 135 mmol/L (ref 135–145)
Total Bilirubin: 1.4 mg/dL — ABNORMAL HIGH (ref 0.3–1.2)
Total Protein: 6.7 g/dL (ref 6.5–8.1)

## 2016-11-03 LAB — CBC
HCT: 34.1 % — ABNORMAL LOW (ref 39.0–52.0)
Hemoglobin: 11.2 g/dL — ABNORMAL LOW (ref 13.0–17.0)
MCH: 30.7 pg (ref 26.0–34.0)
MCHC: 32.8 g/dL (ref 30.0–36.0)
MCV: 93.4 fL (ref 78.0–100.0)
PLATELETS: 292 10*3/uL (ref 150–400)
RBC: 3.65 MIL/uL — AB (ref 4.22–5.81)
RDW: 12.7 % (ref 11.5–15.5)
WBC: 6.7 10*3/uL (ref 4.0–10.5)

## 2016-11-03 LAB — RETICULOCYTES
RBC.: 3.65 MIL/uL — AB (ref 4.22–5.81)
RETIC CT PCT: 0.7 % (ref 0.4–3.1)
Retic Count, Absolute: 25.6 10*3/uL (ref 19.0–186.0)

## 2016-11-03 LAB — FERRITIN: FERRITIN: 4259 ng/mL — AB (ref 24–336)

## 2016-11-03 LAB — URINE CULTURE: Culture: NO GROWTH

## 2016-11-03 LAB — MAGNESIUM: Magnesium: 1.8 mg/dL (ref 1.7–2.4)

## 2016-11-03 LAB — MRSA PCR SCREENING: MRSA by PCR: NEGATIVE

## 2016-11-03 LAB — IRON AND TIBC
Iron: 24 ug/dL — ABNORMAL LOW (ref 45–182)
SATURATION RATIOS: 10 % — AB (ref 17.9–39.5)
TIBC: 235 ug/dL — ABNORMAL LOW (ref 250–450)
UIBC: 211 ug/dL

## 2016-11-03 LAB — T4, FREE: Free T4: 1.15 ng/dL — ABNORMAL HIGH (ref 0.61–1.12)

## 2016-11-03 LAB — TSH: TSH: 0.26 u[IU]/mL — ABNORMAL LOW (ref 0.350–4.500)

## 2016-11-03 LAB — VITAMIN B12: VITAMIN B 12: 358 pg/mL (ref 180–914)

## 2016-11-03 LAB — PHOSPHORUS: Phosphorus: 3.1 mg/dL (ref 2.5–4.6)

## 2016-11-03 LAB — CREATININE, URINE, RANDOM: Creatinine, Urine: 232.21 mg/dL

## 2016-11-03 LAB — SODIUM, URINE, RANDOM: SODIUM UR: 85 mmol/L

## 2016-11-03 LAB — FOLATE: Folate: 32.5 ng/mL (ref >5.9)

## 2016-11-03 MED ORDER — ENSURE ENLIVE PO LIQD
237.0000 mL | Freq: Two times a day (BID) | ORAL | Status: DC
  Administered 2016-11-03: 237 mL via ORAL

## 2016-11-03 MED ORDER — ENSURE ENLIVE PO LIQD
237.0000 mL | Freq: Three times a day (TID) | ORAL | Status: DC
  Administered 2016-11-04 – 2016-11-11 (×7): 237 mL via ORAL

## 2016-11-03 MED ORDER — POTASSIUM CHLORIDE 20 MEQ/15ML (10%) PO SOLN
40.0000 meq | Freq: Once | ORAL | Status: AC
  Administered 2016-11-03: 40 meq via ORAL
  Filled 2016-11-03: qty 30

## 2016-11-03 MED ORDER — OSELTAMIVIR PHOSPHATE 30 MG PO CAPS
30.0000 mg | ORAL_CAPSULE | Freq: Two times a day (BID) | ORAL | Status: DC
  Administered 2016-11-03 – 2016-11-05 (×7): 30 mg via ORAL
  Filled 2016-11-03 (×8): qty 1

## 2016-11-03 MED ORDER — POTASSIUM CHLORIDE CRYS ER 20 MEQ PO TBCR
40.0000 meq | EXTENDED_RELEASE_TABLET | ORAL | Status: DC
  Administered 2016-11-03: 40 meq via ORAL
  Filled 2016-11-03: qty 2

## 2016-11-03 MED ORDER — ENOXAPARIN SODIUM 40 MG/0.4ML ~~LOC~~ SOLN
40.0000 mg | Freq: Every day | SUBCUTANEOUS | Status: DC
  Administered 2016-11-03 – 2016-11-10 (×8): 40 mg via SUBCUTANEOUS
  Filled 2016-11-03 (×8): qty 0.4

## 2016-11-03 MED ORDER — MAGNESIUM SULFATE IN D5W 1-5 GM/100ML-% IV SOLN
1.0000 g | Freq: Once | INTRAVENOUS | Status: AC
  Administered 2016-11-03: 1 g via INTRAVENOUS
  Filled 2016-11-03: qty 100

## 2016-11-03 NOTE — Progress Notes (Signed)
Unable to complete admission screening. Will have oncoming RN f/u with family if available.

## 2016-11-03 NOTE — Progress Notes (Signed)
Initial Nutrition Assessment  DOCUMENTATION CODES:   Severe malnutrition in context of chronic illness  INTERVENTION:   Ensure Enlive po BID, each supplement provides 350 kcal and 20 grams of protein  Magic cup TID with meals, each supplement provides 290 kcal and 9 grams of protein  Snacks  NUTRITION DIAGNOSIS:   Malnutrition related to poor appetite, chronic illness as evidenced by severe depletion of body fat, severe depletion of muscle mass.  GOAL:   Patient will meet greater than or equal to 90% of their needs  MONITOR:   PO intake, Supplement acceptance  REASON FOR ASSESSMENT:   Consult Assessment of nutrition requirement/status  ASSESSMENT:   65 y.o. male with medical history significant of HTN, Left wrist join tinfection, hard of hearing, glaucoma vision impairments. Presented with 2 day history of decreased by mouth intake last active shaking chills lower back pain since yesterday. Increased urinary frequency not associated nausea or vomiting question may be had a cough.   Met with pt in room today. Pt unable to speak. Spoke to sister who was at bedside. Sister reports pt was eating his normal amount of food up until 2 days pta; sister reports that pts normal amount of food is small portions. Pts sister reports that pts wt is stable and that he has weighed 117lbs for a long time.   Medications reviewed and include: lovenox, Mg Sulfate, KCl  Labs reviewed: k 2.8(l), CO2 19(L), BUN 25(H), creat 1.7(H), Ca 8.5(L), AST 56(H), ALT 16(L), tbili 1.4(H)  Nutrition-Focused physical exam completed. Findings are severe fat depletion, severe muscle depletion, and no edema.   Diet Order:  DIET DYS 3 Room service appropriate? Yes; Fluid consistency: Thin  Skin:  Reviewed, no issues  Last BM:  12/17  Height:   Ht Readings from Last 1 Encounters:  11/02/16 5' 10"  (1.778 m)    Weight:   Wt Readings from Last 1 Encounters:  11/02/16 120 lb (54.4 kg)    Ideal Body  Weight:  75 kg  BMI:  Body mass index is 17.22 kg/m.  Estimated Nutritional Needs:   Kcal:  1700-2000kcal/day   Protein:  81-92g/day   Fluid:  2L/day   EDUCATION NEEDS:   No education needs identified at this time  Koleen Distance, RD, LDN

## 2016-11-03 NOTE — Progress Notes (Addendum)
PROGRESS NOTE                                                                                                                                                                                                             Patient Demographics:    Edwin Wright, is a 65 y.o. male, DOB - 1951-08-22, ZOX:[id number]  Admit date - 11/02/2016   Admitting Physician Therisa Doyne, MD  Outpatient Primary MD for the patient is [facility]  LOS - 1  Outpatient Specialists:None  Chief Complaint  Patient presents with  . Chills  . 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 He had increased urinary frequency but no dysuria. Denied fever, cough, shortness of breath, nausea or vomiting. Complained of abdominal discomfort. As per his wife he had mild confusion at baseline he is poorly communicative. In the ED patient was found to have SIRS with fever of 102.10F heart rate in the 100, tachypnea with stable blood pressure. Blood work showed WBC of 9.6, hemoglobin of 12.5, potassium of 3.3 and creatinine of 1.4. S x-ray and UA were negative for infection. CT abdomen and pelvis with contrast showing mild gaseous distention. Admitted to hospitalist service. Flu PCR was positive.   Subjective:    Patient is poorly communicative. Told his sister at bedside that his symptoms had unchanged.   Assessment  & Plan :    Principal problem  SIRS (systemic inflammatory response syndrome) (HCC) Secondary to influenza A. No signs of sepsis this morning. Started on Tamiflu. Supportive care with IV hydration and Tylenol. Low blood culture results.   Active Problems: Acute kidney injury Secondary to dehydration and SIRS. Monitor with IV  fluids.  Hypokalemia/hypomagnesemia Replenished.  Essential hypertension Soft blood pressure. Holding blood pressure medications.   Severe protein calorie malnutrition Dietitian consult appreciated. Added supplement.    Abdominal pain Nondistended on exam. CT abdomen showing gaseous distention. Reportedly had loose stools. Likely associated with acute viral illness.  Transaminitis Mildly elevated AST. Monitor for now. CT abdomen unremarkable for liver or gallbladder pathology.  Iron deficiency anemia add iron supplement prior to discharge.  ? Hyperthyroidism Suppressed TSH  and mildly elevated free T4. Recommend repeating as outpatient once acute issues are resolved.  Glaucoma with visual impairment.   Code Status : Full Code  Family Communication  : Sister at bedside  Disposition Plan  : Return back to the facility once symptoms improved, possibly in the next 48 hours  Barriers For Discharge : Active symptoms  Consults  :  None  Procedures  : CT abdomen and pelvis with contrast  DVT Prophylaxis  :  Lovenox -   Lab Results  Component Value Date   PLT 292 11/03/2016    Antibiotics  :   Anti-infectives    Start     Dose/Rate Route Frequency Ordered Stop   11/03/16 0315  oseltamivir (TAMIFLU) capsule 30 mg     30 mg Oral 2 times daily 11/03/16 0306 11/07/16 2159        Objective:   Vitals:   11/02/16 2057 11/02/16 2149 11/03/16 0526 11/03/16 0700  BP:  (!) 119/55 (!) 122/59 (!) 110/56  Pulse:  92 97 100  Resp:    20  Temp: 98.6 F (37 C) 98.9 F (37.2 C) 97.6 F (36.4 C) 97.9 F (36.6 C)  TempSrc: Oral Oral Oral Oral  SpO2:  94% 95% 96%  Weight:      Height:        Wt Readings from Last 3 Encounters:  11/02/16 54.4 kg (120 lb)  07/21/14 54.4 kg (120 lb)     Intake/Output Summary (Last 24 hours) at 11/03/16 1245 Last data filed at 11/03/16 0603  Gross per 24 hour  Intake             1361 ml  Output                0 ml  Net              1361 ml     Physical Exam  Gen: Elderly thin built male not in distress, appears fatigued, very hard of hearing HEENT: Dry mucosa, temporal wasting, supple neck Chest: clear b/l, no added sounds CVS: N S1&S2, no murmurs, rubs or gallop GI: soft, nondistended, mid abdominal tenderness, bowel sounds present Musculoskeletal: warm, no edema CNS: Alert and awake, poorly communicative    Data Review:    CBC  Recent Labs Lab 11/02/16 1511 11/03/16 0524  WBC 9.6 6.7  HGB 12.5* 11.2*  HCT 36.8* 34.1*  PLT 296 292  MCV 91.8 93.4  MCH 31.2 30.7  MCHC 34.0 32.8  RDW 12.3 12.7  LYMPHSABS 1.1  --   MONOABS 1.2*  --   EOSABS 0.0  --   BASOSABS 0.1  --     Chemistries   Recent Labs Lab 11/02/16 1511 11/03/16 0524  NA 134* 135  K 3.3* 2.8*  CL 100* 103  CO2 23 19*  GLUCOSE 106* 81  BUN 23* 25*  CREATININE 1.44* 1.27*  CALCIUM 9.2 8.5*  MG  --  1.8  AST 49* 56*  ALT 16* 16*  ALKPHOS 70 59  BILITOT 1.2 1.4*   ------------------------------------------------------------------------------------------------------------------ No results for input(s): CHOL, HDL, LDLCALC, TRIG, CHOLHDL, LDLDIRECT in the last 72 hours.  No results found for: HGBA1C ------------------------------------------------------------------------------------------------------------------  Recent Labs  11/03/16 0524  TSH 0.260*   ------------------------------------------------------------------------------------------------------------------  Recent Labs  11/03/16 0524  VITAMINB12 358  FOLATE 32.5  FERRITIN 4,259*  TIBC 235*  IRON 24*  RETICCTPCT 0.7    Coagulation profile No results for input(s): INR, PROTIME in the last 168  hours.  No results for input(s): DDIMER in the last 72 hours.  Cardiac Enzymes No results for input(s): CKMB, TROPONINI, MYOGLOBIN in the last 168 hours.  Invalid input(s):  CK ------------------------------------------------------------------------------------------------------------------ No results found for: BNP  Inpatient Medications  Scheduled Meds: . brimonidine  1 drop Left Eye TID  . dorzolamide  1 drop Both Eyes BID  . enoxaparin (LOVENOX) injection  30 mg Subcutaneous QHS  . feeding supplement (ENSURE ENLIVE)  237 mL Oral TID BM  . latanoprost  1 drop Both Eyes QHS  . oseltamivir  30 mg Oral BID  . potassium chloride  40 mEq Oral Once   Continuous Infusions: PRN Meds:.acetaminophen **OR** acetaminophen, HYDROcodone-acetaminophen, ondansetron **OR** ondansetron (ZOFRAN) IV  Micro Results Recent Results (from the past 240 hour(s))  MRSA PCR Screening     Status: None   Collection Time: 11/02/16 11:10 PM  Result Value Ref Range Status   MRSA by PCR NEGATIVE NEGATIVE Final    Comment:        The GeneXpert MRSA Assay (FDA approved for NASAL specimens only), is one component of a comprehensive MRSA colonization surveillance program. It is not intended to diagnose MRSA infection nor to guide or monitor treatment for MRSA infections.     Radiology Reports Ct Abdomen Pelvis W Contrast  Result Date: 11/02/2016 CLINICAL DATA:  Fever, chills, and low back pain. EXAM: CT ABDOMEN AND PELVIS WITH CONTRAST TECHNIQUE: Multidetector CT imaging of the abdomen and pelvis was performed using the standard protocol following bolus administration of intravenous contrast. CONTRAST:  75 mL Isovue-300 COMPARISON:  None. FINDINGS: Lower chest: The minimal dependent atelectasis in the lung bases. No pleural effusion. Hepatobiliary: No focal liver abnormality is seen. No gallstones, gallbladder wall thickening, or biliary dilatation. Pancreas: Unremarkable. Spleen: Unremarkable. Adrenals/Urinary Tract: Unremarkable adrenal glands. Small low-density renal lesions bilaterally compatible with cysts, the largest measuring 2.1 cm in the interpolar left kidney. No  evidence of renal calculi or hydronephrosis. Unremarkable bladder. Stomach/Bowel: The stomach is within normal limits. There is mild gaseous distension of much of the colon, and there is a small volume of liquid stool scattered throughout the colon including in the rectosigmoid region. Oral contrast is present in nondilated loops of small bowel without evidence of obstruction. The appendix is unremarkable. Vascular/Lymphatic: Abdominal aortic atherosclerosis without aneurysm. No enlarged lymph nodes. Reproductive: Unremarkable prostate. Other: No intraperitoneal free fluid. No abdominal wall mass or hernia. Musculoskeletal: Advanced disc degeneration L2-L5. IMPRESSION: 1. Mild gaseous distention of the colon with liquid stool which may reflect a diarrheal illness. 2. No other acute abnormality identified in the abdomen or pelvis. 3. Aortic atherosclerosis. Electronically Signed   By: Sebastian AcheAllen  Grady M.D.   On: 11/02/2016 21:14   Dg Chest Port 1 View  Result Date: 11/02/2016 CLINICAL DATA:  Short of breath, cough, chills, low back pain, and fever. EXAM: PORTABLE CHEST 1 VIEW COMPARISON:  None. FINDINGS: The cardiomediastinal silhouette is within normal limits. The lungs are normally to mildly hyperinflated without evidence of airspace consolidation, edema, pleural effusion, or pneumothorax. No acute osseous abnormality is identified. IMPRESSION: No active disease. Electronically Signed   By: Sebastian AcheAllen  Grady M.D.   On: 11/02/2016 15:09    Time Spent in minutes  35   Eddie NorthHUNGEL, Bijan Ridgley M.D on 11/03/2016 at 12:45 PM  Between 7am to 7pm - Pager - [phone]  After 7pm go to [email or web address] - password [email or web address]  [facility] -  Office  [phone]

## 2016-11-03 NOTE — Evaluation (Signed)
Physical Therapy Evaluation Patient Details Name: Edwin Wright MRN: [id number] DOB: 12/17/1950 Today's Date: 11/03/2016   History of Present Illness  65 y.o. male admitted from ALF with fever, chills, back pain. Dx of flu, SIRS, dehydration, ARF. PMH of HTN and glaucoma, mild confusion at baseline.   Clinical Impression  Pt admitted with above diagnosis. Pt currently with functional limitations due to the deficits listed below (see PT Problem List). Mod assist for bed mobility, min A to ambulate 5' with RW, distance limited by fatigue.  Pt will benefit from skilled PT to increase their independence and safety with mobility to allow discharge to the venue listed below.       Follow Up Recommendations Home health PT;Supervision for mobility/OOB    Equipment Recommendations  Rolling walker with 5" wheels    Recommendations for Other Services       Precautions / Restrictions Precautions Precautions: Fall Precaution Comments: sister reports pt has had no falls in past 1 year Restrictions Weight Bearing Restrictions: No      Mobility  Bed Mobility Overal bed mobility: Needs Assistance Bed Mobility: Supine to Sit     Supine to sit: Mod assist     General bed mobility comments: mod A to initiate movement, to raise trunk and pivot hips with pad  Transfers Overall transfer level: Needs assistance Equipment used: Rolling walker (2 wheeled) Transfers: Sit to/from Stand Sit to Stand: Min assist         General transfer comment: min A to rise from lowered bed  Ambulation/Gait Ambulation/Gait assistance: Min assist Ambulation Distance (Feet): 5 Feet Assistive device: Rolling walker (2 wheeled) Gait Pattern/deviations: Step-through pattern;Decreased step length - left;Decreased stance time - right   Gait velocity interpretation: Below normal speed for age/gender General Gait Details: min A to maneuver RW, 2/4 dyspnea with walking, distance limited by fatigue, manual cues for  hand placement on RW and for reaching back to armrests of recliner prior to sitting  Stairs            Wheelchair Mobility    Modified Rankin (Stroke Patients Only)       Balance Overall balance assessment: Modified Independent                                           Pertinent Vitals/Pain Pain Assessment: Faces Faces Pain Scale: No hurt    Home Living Family/patient expects to be discharged to:: Assisted living Living Arrangements: Alone Available Help at Discharge: Family;Available PRN/intermittently Type of Home: Assisted living Home Access: Level entry     Home Layout: One level Home Equipment: None      Prior Function Level of Independence: Independent         Comments: independent with ADLs and with walking to dining room at ALF, sister reports pt walks slowly due to poor vision; above info provided by pt's sister Edwin Wright, [facility]function[facility]     Hand Dominance        Extremity/Trunk Assessment   Upper Extremity Assessment Upper Extremity Assessment: Overall WFL for tasks assessed    Lower Extremity Assessment Lower Extremity Assessment: Overall WFL for tasks assessed    Cervical / Trunk Assessment Cervical / Trunk Assessment: Normal  Communication   Communication: HOH  Cognition Arousal/Alertness: Awake/alert Behavior During Therapy: WFL for tasks assessed/performed Overall Cognitive Status: Difficult to assess  General Comments      Exercises     Assessment/Plan    PT Assessment Patient needs continued PT services  PT Problem List Decreased activity tolerance;Decreased mobility;Decreased knowledge of use of DME          PT Treatment Interventions DME instruction;Gait training;Functional mobility training;Therapeutic activities;Patient/family education;Therapeutic exercise    PT Goals (Current goals can be found in the Care Plan  section)  Acute Rehab PT Goals Patient Stated Goal: return to independence at ALF PT Goal Formulation: With family Time For Goal Achievement: 11/17/16 Potential to Achieve Goals: Good    Frequency Min 3X/week   Barriers to discharge        Co-evaluation               End of Session Equipment Utilized During Treatment: Gait belt Activity Tolerance: Patient tolerated treatment well Patient left: in chair;with call bell/phone within reach;with chair alarm set Nurse Communication: Mobility status         Time: [id number] PT Time Calculation (min) (ACUTE ONLY): 23 min   Charges:   PT Evaluation $PT Eval Low Complexity: 1 Procedure PT Treatments $Therapeutic Activity: 8-22 mins   PT G Codes:        Tamala SerUhlenberg, Cohan Stipes Kistler 11/03/2016, 11:19 AM [phone]

## 2016-11-03 NOTE — Progress Notes (Signed)
OT Cancellation Note  Patient Details Name: Edwin Wright MRN: [id number] DOB: 11/08/1951   Cancelled Treatment:    Reason Eval/Treat Not Completed: Fatigue/lethargy limiting ability to participate  Pt had just gotten back to bed after being up for several hours.  Pt ask OT to check back on pt later in day or next day.  Lise AuerLori Wes Lezotte, [place]OT [phone]  Einar CrowEDDING, Allen Egerton D 11/03/2016, 3:07 PM

## 2016-11-03 NOTE — Evaluation (Signed)
Clinical/Bedside Swallow Evaluation Patient Details  Name: Edwin Wright MRN: [id number] Date of Birth: 03/18/1951  Today's Date: 11/03/2016 Time: SLP Start Time (ACUTE ONLY): 1559 SLP Stop Time (ACUTE ONLY): 1620 SLP Time Calculation (min) (ACUTE ONLY): 21 min  Past Medical History:  Past Medical History:  Diagnosis Date  . Glaucoma   . Hypertension    Past Surgical History:  Past Surgical History:  Procedure Laterality Date  . ARTHROTOMY Left 07/21/2014   Procedure: ARTHROTOMY; IRRIGATION AND DRAINAGE OF LEFT WRIST INFECTION;  Surgeon: Eldred MangesMark C Yates, MD;  Location: [facility] OR;  Service: Orthopedics;  Laterality: Left;   HPI:  65 [facility], fever.  Pt found to have the flu.  Pt PMH  + for Hearing loss, weight loss, severe glaucoma, recent move to ALF.  Pt CT showed ? recent diarrheal illness as liquid stool noted.  CXR 12/17 no pleural effusion.  Pt tested positive for flu.     Assessment / Plan / Recommendation Clinical Impression  NO indication of significant oropharyngeal dysphagia.  Pt does not have partials at hospital and normally eats with them in place.  With pt permission, SLP called pt's sister Edwin Wright who reports she will bring his partials to hospital tomorrow.  NO focal CN deficits re: swallow musculature noted.  Pt fatigue due to current illness and lack of proper dentition are primary aspiration risks.  Recommend continue dys3/thin due to fatigue and advance as pt medically improves.  NO follow up needed.    Aspiration Risk  Mild aspiration risk    Diet Recommendation Dysphagia 3 (Mech soft);Thin liquid   Medication Administration: Whole meds with liquid Compensations: Slow rate;Small sips/bites    Other  Recommendations Oral Care Recommendations: Oral care BID   Follow up Recommendations   n/a     Frequency and Duration   n/a         Prognosis   n/a     Swallow Study   General Date of Onset: 11/03/16 HPI: 65 [facility]  [facility]ala[facility], fever.  Pt found to have the flu.  Pt PMH  + for Hearing loss, weight loss, severe glaucoma, recent move to ALF.  Pt CT showed ? recent diarrheal illness as liquid stool noted.  CXR 12/17 no pleural effusion.  Pt tested positive for flu.   Type of Study: Bedside Swallow Evaluation Diet Prior to this Study: Dysphagia 3 (soft);Thin liquids Temperature Spikes Noted: No Respiratory Status: Room air History of Recent Intubation: No Behavior/Cognition: Cooperative;Pleasant mood;Lethargic/Drowsy Oral Cavity Assessment: Within Functional Limits Oral Care Completed by SLP: No Oral Cavity - Dentition: Missing dentition (partials to be brought in by sister Edwin Cellaorothy tomorrow.) Vision: Functional for self-feeding Self-Feeding Abilities: Able to feed self Patient Positioning: Upright in bed Baseline Vocal Quality: Normal Volitional Cough: Strong Volitional Swallow: Unable to elicit    Oral/Motor/Sensory Function Overall Oral Motor/Sensory Function: Within functional limits   Ice Chips Ice chips: Not tested   Thin Liquid Thin Liquid: Within functional limits Presentation: Self Fed;Straw    . Nectar Thick Liquid: Not tested    Honey Thick Liquid: Not tested    Puree: Not tested    Solid: Impaired Presentation: Self Fed;Spoon Oral Phase Impairments: Impaired mastication;Other (comment) (prolonged mastication) Oral Phase Functional Implications: Prolonged oral transit        Chales AbrahamsKimball, Jeanett Antonopoulos Ann 11/03/2016,6:37 PM  Donavan Burnetamara Hayle Parisi, MS [facility] SLP [phone]

## 2016-11-03 NOTE — Clinical Social Work Note (Signed)
Clinical Social Work Assessment  Patient Details  Name: Edwin Wright MRN: [id number] Date of Birth: [birth year]  Date of referral:  11/03/16               Reason for consult:  Facility Placement                Permission sought to share information with:  Facility Sport and exercise psychologist, Family Supports Permission granted to share information::  Yes, Verbal Permission Granted  Name::     Adult nurse  Agency::  [facility]  Relationship::  Sister  Contact Information:   [phone]  Housing/Transportation Living arrangements for the past 2 months:  [place] of Information:  Other (Comment Required) (Sister: "Dot") Patient Interpreter Needed:  None Criminal Activity/Legal Involvement Pertinent to Current Situation/Hospitalization:  No - Comment as needed Significant Relationships:  Siblings Lives with:  Facility Resident Do you feel safe going back to the place where you live?  Yes Need for family participation in patient care:  Yes (Comment)  Care giving concerns:  Patient sister is concern because pt. has not been eating and spiked a temperature of 103.The patient reported feeling chills and abdominal and back pain.      Social Worker assessment / plan:  LCSWA met with patient and sister at bedside. Patient sister reports the patient has been living at [facility] since April 2017. The patient is legally blind and has hearing impairment (Wears hearing aides). Patient can do all ADL's and ambulates well while at facility.  Patient Med Tech: Alita at facility reports the patient is very idenpendent and very alert.  Plan: Assist with patient disposition to ALF.   Employment status:  Retired Nurse, adult PT Recommendations:  Not assessed at this time Information / Referral to community resources:  [place]  Patient/Family's Response to care:  Agreeable and responding well to care.  Patient sister concerned if pt. Is receiving his medications that he takes at ALF.   Patient/Family's Understanding of and Emotional Response to Diagnosis, Current Treatment, and Prognosis: Patient reports, "nothing has changed, and still reports he feels cold." Sister at bedside, to learn about patient diagnosis.   Emotional Assessment Appearance:  Appears stated age Attitude/Demeanor/Rapport:    Affect (typically observed):  Calm Orientation:  Oriented to Self, Oriented to Place Alcohol / Substance use:  Not Applicable Psych involvement (Current and /or in the community):  No (Comment)  Discharge Needs  Concerns to be addressed:  Discharge Planning Concerns Readmission within the last 30 days:  No Current discharge risk:  None Barriers to Discharge:  Continued Medical Work up   [employer], LCSW 11/03/2016, 9:59 AM

## 2016-11-03 NOTE — Progress Notes (Signed)
BSE completed, full report to follow.  NO indication of significant oropharyngeal dysphagia.  Pt does not have partials at hospital and normally eats with them in place.  With pt permission, SLP called pt's sister [name] who reports she will bring his partials to hospital tomorrow.  NO focal CN deficits re: swallow musculature noted.  Pt fatigue due to current illness and lack of proper dentition are primary aspiration risks.  Recommend continue dys3/thin due to fatigue and advance as pt medically improves.  NO follow up needed.  Thanks.Edwin Wright.   Annabeth Tortora, MS [facility] SLP [phone]

## 2016-11-04 ENCOUNTER — Encounter (HOSPITAL_COMMUNITY): Payer: Self-pay

## 2016-11-04 DIAGNOSIS — J09X2 Influenza due to identified novel influenza A virus with other respiratory manifestations: Secondary | ICD-10-CM

## 2016-11-04 LAB — BASIC METABOLIC PANEL
Anion gap: 9 (ref 5–15)
BUN: 19 mg/dL (ref 6–20)
CHLORIDE: 108 mmol/L (ref 101–111)
CO2: 19 mmol/L — ABNORMAL LOW (ref 22–32)
CREATININE: 1.11 mg/dL (ref 0.61–1.24)
Calcium: 8.6 mg/dL — ABNORMAL LOW (ref 8.9–10.3)
Glucose, Bld: 102 mg/dL — ABNORMAL HIGH (ref 65–99)
POTASSIUM: 3.4 mmol/L — AB (ref 3.5–5.1)
SODIUM: 136 mmol/L (ref 135–145)

## 2016-11-04 LAB — T3, FREE: T3 FREE: 1.6 pg/mL — AB (ref 2.0–4.4)

## 2016-11-04 MED ORDER — POTASSIUM CHLORIDE 20 MEQ/15ML (10%) PO SOLN
40.0000 meq | Freq: Once | ORAL | Status: AC
  Administered 2016-11-04: 40 meq via ORAL
  Filled 2016-11-04 (×2): qty 30

## 2016-11-04 MED ORDER — POTASSIUM CHLORIDE CRYS ER 20 MEQ PO TBCR
40.0000 meq | EXTENDED_RELEASE_TABLET | Freq: Once | ORAL | Status: DC
  Filled 2016-11-04: qty 2

## 2016-11-04 NOTE — Progress Notes (Signed)
Pt unable to tolerate PO potassium at this time. Attempted to dissolve and mix in applesauce. Pt refuses to take at this time. Medicated for nausea, will continue to monitor.

## 2016-11-04 NOTE — Progress Notes (Signed)
PROGRESS NOTE                                                                                                                                                                                                             Patient Demographics:    Edwin Wright, is a 65 y.o. male, DOB - [birth year], ZOX:[id number]  Admit date - 11/02/2016   Admitting Physician Therisa Doyne, MD  Outpatient Primary MD for the patient is [facility]  LOS - 2  Outpatient Specialists:None  Chief Complaint  Patient presents with  . Chills  . 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 He had increased urinary frequency but no dysuria. Denied fever, cough, shortness of breath, nausea or vomiting. Complained of abdominal discomfort. As per his wife he had mild confusion at baseline he is poorly communicative. In the ED patient was found to have SIRS with fever of 102.10F heart rate in the 100, tachypnea with stable blood pressure. Blood work showed WBC of 9.6, hemoglobin of 12.5, potassium of 3.3 and creatinine of 1.4. S x-ray and UA were negative for infection. CT abdomen and pelvis with contrast showing mild gaseous distention. Admitted to hospitalist service. Flu PCR was positive.   Subjective:   Has low-grade fever. Still poorly communicative to assess his subjective improvement.   Assessment  & Plan :    Principal problem  SIRS (systemic inflammatory response syndrome) (HCC) Secondary to influenza A. Improving, has low-grade fever this morning. Continue Tamiflu. Supportive care with IV hydration and Tylenol. Blood cultures negative. Follow respiratory viral panel.   Active Problems: Acute kidney injury Secondary to dehydration and SIRS. Approved  with IV fluids.  Hypokalemia/hypomagnesemia Replenished.  Essential hypertension Soft blood pressure. Holding blood pressure medications.   Severe protein calorie malnutrition Dietitian consult appreciated. Added supplement.    Abdominal pain Nondistended on exam. CT abdomen showing gaseous distention. Reportedly had loose stools. Likely associated with acute viral illness.  Transaminitis Mildly elevated AST. Monitor for now. CT abdomen unremarkable for liver or gallbladder pathology.  Iron deficiency anemia add iron supplement prior to discharge.  ? Hyperthyroidism Suppressed TSH and mildly  elevated free T4. Recommend repeating as outpatient once acute issues are resolved.  Glaucoma with visual impairment.  Seen by SLP, no issues. Seen by PT and recommends home health.  Code Status : Full Code  Family Communication  : None at bedside  Disposition Plan  : Return back to the assisted living possibly tomorrow if remains afebrile and clinically improving.  Barriers For Discharge : Improving symptoms  Consults  :  None  Procedures  : CT abdomen and pelvis with contrast  DVT Prophylaxis  :  Lovenox -   Lab Results  Component Value Date   PLT 292 11/03/2016    Antibiotics  :   Anti-infectives    Start     Dose/Rate Route Frequency Ordered Stop   11/03/16 0315  oseltamivir (TAMIFLU) capsule 30 mg     30 mg Oral 2 times daily 11/03/16 0306 11/07/16 2159        Objective:   Vitals:   11/03/16 0700 11/03/16 1356 11/03/16 2039 11/04/16 0612  BP: (!) 110/56 (!) 103/45 (!) 106/56 117/65  Pulse: 100 88 81 90  Resp: 20 20 16 18   Temp: 97.9 F (36.6 C) 99.2 F (37.3 C) 99.4 F (37.4 C) 98.7 F (37.1 C)  TempSrc: Oral Oral Oral Oral  SpO2: 96% 94% 95% 97%  Weight:      Height:        Wt Readings from Last 3 Encounters:  11/02/16 54.4 kg (120 lb)  07/21/14 54.4 kg (120 lb)     Intake/Output Summary (Last 24 hours) at 11/04/16 1303 Last data filed at  11/04/16 1610  Gross per 24 hour  Intake               10 ml  Output              475 ml  Net             -465 ml     Physical Exam  Gen: Elderly thin built male not in distress, appears fatigued, very hard of hearing HEENT:temporal wasting, supple neck, Moist mucosa Chest: clear b/l, no added sounds CVS: N S1&S2, no murmurs GI: soft, nondistended, abdominal tenderness resolved, bowel sounds present Musculoskeletal: warm, no edema CNS: Alert and awake, poorly communicative    Data Review:    CBC  Recent Labs Lab 11/02/16 1511 11/03/16 0524  WBC 9.6 6.7  HGB 12.5* 11.2*  HCT 36.8* 34.1*  PLT 296 292  MCV 91.8 93.4  MCH 31.2 30.7  MCHC 34.0 32.8  RDW 12.3 12.7  LYMPHSABS 1.1  --   MONOABS 1.2*  --   EOSABS 0.0  --   BASOSABS 0.1  --     Chemistries   Recent Labs Lab 11/02/16 1511 11/03/16 0524 11/04/16 0508  NA 134* 135 136  K 3.3* 2.8* 3.4*  CL 100* 103 108  CO2 23 19* 19*  GLUCOSE 106* 81 102*  BUN 23* 25* 19  CREATININE 1.44* 1.27* 1.11  CALCIUM 9.2 8.5* 8.6*  MG  --  1.8  --   AST 49* 56*  --   ALT 16* 16*  --   ALKPHOS 70 59  --   BILITOT 1.2 1.4*  --    ------------------------------------------------------------------------------------------------------------------ No results for input(s): CHOL, HDL, LDLCALC, TRIG, CHOLHDL, LDLDIRECT in the last 72 hours.  No results found for: HGBA1C ------------------------------------------------------------------------------------------------------------------  Recent Labs  11/03/16 0524 11/03/16 0813  TSH 0.260*  --   T3FREE  --  1.6*   ------------------------------------------------------------------------------------------------------------------  Recent Labs  11/03/16 0524  VITAMINB12 358  FOLATE 32.5  FERRITIN 4,259*  TIBC 235*  IRON 24*  RETICCTPCT 0.7    Coagulation profile No results for input(s): INR, PROTIME in the last 168 hours.  No results for input(s): DDIMER in the  last 72 hours.  Cardiac Enzymes No results for input(s): CKMB, TROPONINI, MYOGLOBIN in the last 168 hours.  Invalid input(s): CK ------------------------------------------------------------------------------------------------------------------ No results found for: BNP  Inpatient Medications  Scheduled Meds: . brimonidine  1 drop Left Eye TID  . dorzolamide  1 drop Both Eyes BID  . enoxaparin (LOVENOX) injection  40 mg Subcutaneous QHS  . feeding supplement (ENSURE ENLIVE)  237 mL Oral TID BM  . latanoprost  1 drop Both Eyes QHS  . oseltamivir  30 mg Oral BID  . potassium chloride  40 mEq Oral Once   Continuous Infusions: PRN Meds:.acetaminophen **OR** acetaminophen, HYDROcodone-acetaminophen, ondansetron **OR** ondansetron (ZOFRAN) IV  Micro Results Recent Results (from the past 240 hour(s))  Culture, blood (Routine X 2) w Reflex to ID Panel     Status: None (Preliminary result)   Collection Time: 11/02/16  3:11 PM  Result Value Ref Range Status   Specimen Description BLOOD BLOOD RIGHT FOREARM  Final   Special Requests BOTTLES DRAWN AEROBIC AND ANAEROBIC 5 CC EACH  Final   Culture   Final    NO GROWTH < 24 HOURS Performed at [facility]    Report Status PENDING  Incomplete  Urine culture     Status: None   Collection Time: 11/02/16  5:02 PM  Result Value Ref Range Status   Specimen Description URINE, CATHETERIZED  Final   Special Requests NONE  Final   Culture NO GROWTH Performed at [facility]   Final   Report Status 11/03/2016 FINAL  Final  Culture, blood (Routine X 2) w Reflex to ID Panel     Status: None (Preliminary result)   Collection Time: 11/02/16  5:05 PM  Result Value Ref Range Status   Specimen Description BLOOD LEFT ANTECUBITAL  Final   Special Requests BOTTLES DRAWN AEROBIC AND ANAEROBIC 10CC  Final   Culture   Final    NO GROWTH < 24 HOURS Performed at [facility]    Report Status PENDING  Incomplete  MRSA PCR Screening      Status: None   Collection Time: 11/02/16 11:10 PM  Result Value Ref Range Status   MRSA by PCR NEGATIVE NEGATIVE Final    Comment:        The GeneXpert MRSA Assay (FDA approved for NASAL specimens only), is one component of a comprehensive MRSA colonization surveillance program. It is not intended to diagnose MRSA infection nor to guide or monitor treatment for MRSA infections.     Radiology Reports Ct Abdomen Pelvis W Contrast  Result Date: 11/02/2016 CLINICAL DATA:  Fever, chills, and low back pain. EXAM: CT ABDOMEN AND PELVIS WITH CONTRAST TECHNIQUE: Multidetector CT imaging of the abdomen and pelvis was performed using the standard protocol following bolus administration of intravenous contrast. CONTRAST:  75 mL Isovue-300 COMPARISON:  None. FINDINGS: Lower chest: The minimal dependent atelectasis in the lung bases. No pleural effusion. Hepatobiliary: No focal liver abnormality is seen. No gallstones, gallbladder wall thickening, or biliary dilatation. Pancreas: Unremarkable. Spleen: Unremarkable. Adrenals/Urinary Tract: Unremarkable adrenal glands. Small low-density renal lesions bilaterally compatible with cysts, the largest measuring 2.1 cm in the interpolar left kidney. No evidence of renal calculi or hydronephrosis. Unremarkable bladder.  Stomach/Bowel: The stomach is within normal limits. There is mild gaseous distension of much of the colon, and there is a small volume of liquid stool scattered throughout the colon including in the rectosigmoid region. Oral contrast is present in nondilated loops of small bowel without evidence of obstruction. The appendix is unremarkable. Vascular/Lymphatic: Abdominal aortic atherosclerosis without aneurysm. No enlarged lymph nodes. Reproductive: Unremarkable prostate. Other: No intraperitoneal free fluid. No abdominal wall mass or hernia. Musculoskeletal: Advanced disc degeneration L2-L5. IMPRESSION: 1. Mild gaseous distention of the colon with  liquid stool which may reflect a diarrheal illness. 2. No other acute abnormality identified in the abdomen or pelvis. 3. Aortic atherosclerosis. Electronically Signed   By: Sebastian AcheAllen  Grady M.D.   On: 11/02/2016 21:14   Dg Chest Port 1 View  Result Date: 11/02/2016 CLINICAL DATA:  Short of breath, cough, chills, low back pain, and fever. EXAM: PORTABLE CHEST 1 VIEW COMPARISON:  None. FINDINGS: The cardiomediastinal silhouette is within normal limits. The lungs are normally to mildly hyperinflated without evidence of airspace consolidation, edema, pleural effusion, or pneumothorax. No acute osseous abnormality is identified. IMPRESSION: No active disease. Electronically Signed   By: Sebastian AcheAllen  Grady M.D.   On: 11/02/2016 15:09    Time Spent in minutes  25   Eddie NorthHUNGEL, Rockney Grenz M.D on 11/04/2016 at 1:03 PM  Between 7am to 7pm - Pager - [phone]  After 7pm go to [email or web address] - password [email or web address]  [facility] -  Office  [phone]

## 2016-11-04 NOTE — Progress Notes (Signed)
OT Cancellation Note  Patient Details Name: Edwin Wright MRN: [id number] DOB: 03/06/1951   Cancelled Treatment:    Reason Eval/Treat Not Completed: Fatigue/lethargy limiting ability to participate. Will attempt to assess tomorrow.   [facility]ILLARY  Tashawnda Bleiler, OT/L  [phone] 11/04/2016 11/04/2016, 4:39 PM

## 2016-11-05 DIAGNOSIS — E860 Dehydration: Secondary | ICD-10-CM

## 2016-11-05 DIAGNOSIS — R6510 Systemic inflammatory response syndrome (SIRS) of non-infectious origin without acute organ dysfunction: Secondary | ICD-10-CM

## 2016-11-05 MED ORDER — SODIUM CHLORIDE 0.9 % IV BOLUS (SEPSIS)
250.0000 mL | Freq: Once | INTRAVENOUS | Status: AC
  Administered 2016-11-05: 250 mL via INTRAVENOUS

## 2016-11-05 MED ORDER — POTASSIUM CHLORIDE 20 MEQ/15ML (10%) PO SOLN
40.0000 meq | Freq: Once | ORAL | Status: DC
  Filled 2016-11-05: qty 30

## 2016-11-05 MED ORDER — SODIUM CHLORIDE 0.9 % IV SOLN
INTRAVENOUS | Status: DC
  Administered 2016-11-05 – 2016-11-08 (×3): via INTRAVENOUS

## 2016-11-05 NOTE — Progress Notes (Signed)
LCSWA [facility] [place] [facility] and disposition. LCSWA will continue to follow and assist with patient disposition once medically stable.   Vivi BarrackNicole Masaki Rothbauer, Theresia MajorsLCSWA, MSW Clinical Social Worker 5E and Psychiatric Service Line [phone] 11/05/2016  4:23 PM

## 2016-11-05 NOTE — Progress Notes (Addendum)
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 He had increased urinary frequency but no dysuria. Denied fever, cough, shortness of breath, nausea or vomiting. Complained of abdominal discomfort. As per his wife he had mild confusion at baseline he is poorly communicative. In the ED patient was found to have SIRS with fever of 102.30F heart rate in the 100, tachypnea with stable blood pressure. Blood work showed WBC of 9.6, hemoglobin of 12.5, potassium of 3.3 and creatinine of 1.4. S x-ray and UA were negative for infection. CT abdomen and pelvis with contrast showing mild gaseous distention. Admitted to hospitalist service. Flu PCR was positive   Assessment & Plan:   Active Problems:   Hypertension   SIRS (systemic inflammatory response syndrome) (HCC)   Dehydration   Glaucoma   ARF (acute renal failure) (HCC)   Anemia   Protein-calorie malnutrition, severe   Acute kidney injury (HCC)   Iron deficiency anemia   Hypokalemia   Hypomagnesemia   SIRS (systemic inflammatory response syndrome) (HCC) -Secondary to influenza A. -Improving, has low-grade fever this morning. Continued Tamiflu, day 3 today.  -Patient to continue IV hydration as tolerated and Tylenol.  -Blood cultures negative.  Acute kidney injury -Secondary to dehydration and SIRS.  -Cont IVF as tolerated -patient clinically dehydrated. Will give 250cc bolus  Hypokalemia/hypomagnesemia -Improved, still low -Will give replacement dose  Essential hypertension -Blood pressures stable/soft -BP meds remain on hold   Severe protein calorie malnutrition -Dietitian consult appreciated. -Stable at this time. Encourage PO intake.   Abdominal  pain -Nondistended on exam.  -CT abdomen showing gaseous distention.  -Pt reportedly had loose stools, suspect secondary to viral illness  Transaminitis -AST mildly elevated -CT abdomen unremarkable for liver or gallbladder pathology. -Repeat LFT's in AM  Iron deficiency anemia -Recommend iron supplement prior to discharge. -Stable at this time  ? Hyperthyroidism -Suppressed TSH and mildly elevated free T4.  -Recommend repeat TSH in 3-4 weeks  Glaucoma with visual impairment. - Stable at this time  DVT prophylaxis: Lovenox Code Status: Full Family Communication: Pt in room, family at bedside Disposition Plan: Uncertain at this time  Consultants:     Procedures:     Antimicrobials: Anti-infectives    Start     Dose/Rate Route Frequency Ordered Stop   11/03/16 0315  oseltamivir (TAMIFLU) capsule 30 mg     30 mg Oral 2 times daily 11/03/16 0306 11/07/16 2159      Subjective: Non-verbal at this time  Objective: Vitals:   11/04/16 1334 11/04/16 2103 11/05/16 0513 11/05/16 1423  BP: (!) 118/52 (!) 106/58 (!) 112/59 (!) 174/73  Pulse: 83 79 83 75  Resp: 20 20 20 20   Temp: 97.4 F (36.3 C) 98.4 F (36.9 C) 97.7 F (36.5 C) 98.5 F (36.9 C)  TempSrc: Oral Oral Oral Oral  SpO2: 97% 98% 96% 98%  Weight:      Height:        Intake/Output Summary (Last 24 hours) at 11/05/16 1512 Last data filed at 11/05/16 1300  Gross per 24 hour  Intake                0 ml  Output  250 ml  Net             -250 ml   Filed Weights   11/02/16 1504  Weight: 54.4 kg (120 lb)    Examination:  General exam: Appears calm and comfortable  Respiratory system: Clear to auscultation. Respiratory effort normal. Cardiovascular system: S1 & S2 heard, RRR Gastrointestinal system: Abdomen is nondistended, soft and nontender. No organomegaly or masses felt. Normal bowel sounds heard. Central nervous system: Alert and oriented. No focal neurological  deficits. Extremities: Symmetric 5 x 5 power. Skin: No rashes, lesions Psychiatry: non-verbal, unable to fully determine, unable to fully assess.   Data Reviewed: I have personally reviewed following labs and imaging studies  CBC:  Recent Labs Lab 11/02/16 1511 11/03/16 0524  WBC 9.6 6.7  NEUTROABS 7.3  --   HGB 12.5* 11.2*  HCT 36.8* 34.1*  MCV 91.8 93.4  PLT 296 292   Basic Metabolic Panel:  Recent Labs Lab 11/02/16 1511 11/03/16 0524 11/04/16 0508  NA 134* 135 136  K 3.3* 2.8* 3.4*  CL 100* 103 108  CO2 23 19* 19*  GLUCOSE 106* 81 102*  BUN 23* 25* 19  CREATININE 1.44* 1.27* 1.11  CALCIUM 9.2 8.5* 8.6*  MG  --  1.8  --   PHOS  --  3.1  --    GFR: Estimated Creatinine Clearance: 49.7 mL/min (by C-G formula based on SCr of 1.11 mg/dL). Liver Function Tests:  Recent Labs Lab 11/02/16 1511 11/03/16 0524  AST 49* 56*  ALT 16* 16*  ALKPHOS 70 59  BILITOT 1.2 1.4*  PROT 8.0 6.7  ALBUMIN 4.0 3.5    Recent Labs Lab 11/02/16 1511  LIPASE 33   No results for input(s): AMMONIA in the last 168 hours. Coagulation Profile: No results for input(s): INR, PROTIME in the last 168 hours. Cardiac Enzymes: No results for input(s): CKTOTAL, CKMB, CKMBINDEX, TROPONINI in the last 168 hours. BNP (last 3 results) No results for input(s): PROBNP in the last 8760 hours. HbA1C: No results for input(s): HGBA1C in the last 72 hours. CBG:  Recent Labs Lab 11/02/16 1544  GLUCAP 98   Lipid Profile: No results for input(s): CHOL, HDL, LDLCALC, TRIG, CHOLHDL, LDLDIRECT in the last 72 hours. Thyroid Function Tests:  Recent Labs  11/03/16 0524 11/03/16 0813  TSH 0.260*  --   FREET4  --  1.15*  T3FREE  --  1.6*   Anemia Panel:  Recent Labs  11/03/16 0524  VITAMINB12 358  FOLATE 32.5  FERRITIN 4,259*  TIBC 235*  IRON 24*  RETICCTPCT 0.7   Sepsis Labs:  Recent Labs Lab 11/02/16 1529 11/02/16 2227  PROCALCITON  --  0.30  LATICACIDVEN 1.40  --      Recent Results (from the past 240 hour(s))  Culture, blood (Routine X 2) w Reflex to ID Panel     Status: None (Preliminary result)   Collection Time: 11/02/16  3:11 PM  Result Value Ref Range Status   Specimen Description BLOOD BLOOD RIGHT FOREARM  Final   Special Requests BOTTLES DRAWN AEROBIC AND ANAEROBIC 5 CC EACH  Final   Culture   Final    NO GROWTH 3 DAYS Performed at [facility]    Report Status PENDING  Incomplete  Urine culture     Status: None   Collection Time: 11/02/16  5:02 PM  Result Value Ref Range Status   Specimen Description URINE, CATHETERIZED  Final   Special Requests NONE  Final  Culture NO GROWTH Performed at [facility]   Final   Report Status 11/03/2016 FINAL  Final  Culture, blood (Routine X 2) w Reflex to ID Panel     Status: None (Preliminary result)   Collection Time: 11/02/16  5:05 PM  Result Value Ref Range Status   Specimen Description BLOOD LEFT ANTECUBITAL  Final   Special Requests BOTTLES DRAWN AEROBIC AND ANAEROBIC 10CC  Final   Culture   Final    NO GROWTH 3 DAYS Performed at [facility]    Report Status PENDING  Incomplete  MRSA PCR Screening     Status: None   Collection Time: 11/02/16 11:10 PM  Result Value Ref Range Status   MRSA by PCR NEGATIVE NEGATIVE Final    Comment:        The GeneXpert MRSA Assay (FDA approved for NASAL specimens only), is one component of a comprehensive MRSA colonization surveillance program. It is not intended to diagnose MRSA infection nor to guide or monitor treatment for MRSA infections.      Radiology Studies: No results found.  Scheduled Meds: . brimonidine  1 drop Left Eye TID  . dorzolamide  1 drop Both Eyes BID  . enoxaparin (LOVENOX) injection  40 mg Subcutaneous QHS  . feeding supplement (ENSURE ENLIVE)  237 mL Oral TID BM  . latanoprost  1 drop Both Eyes QHS  . oseltamivir  30 mg Oral BID  . potassium chloride  40 mEq Oral Once  . sodium chloride   250 mL Intravenous Once   Continuous Infusions: . sodium chloride 100 mL/hr at 11/05/16 1047     LOS: 3 days   CHIU, Scheryl Marten, MD [facility] Pager [phone]  If 7PM-7AM, please contact night-coverage [email or web address] Password [email or web address] 11/05/2016, 3:12 PM

## 2016-11-05 NOTE — Care Management Note (Signed)
Case Management Note  Patient Details  Name: Edwin AmorDonald Siverson MRN: [id number] Date of Birth: 03/14/1951  Subjective/Objective: 65 y.o. M admitted 11/02/2016 with SIRS. [facility] and will return when medically ready. Pt HOH, Blind at baseline. PT recommending RW at discharge.                    Action/Plan:CM will continue to follow for discharge needs.    Expected Discharge Date:   (UNKNOWN)               Expected Discharge Plan:  [facility] / [facility] ([facility]  In-House Referral:  Clinical Social Work  Discharge planning Services  CM Consult  Post Acute Care Choice:  Durable Medical Equipment Choice offered to:  Patient  DME Arranged:  [facility] DME Agency:     HH Arranged:    HH Agency:     Status of Service:  In process, will continue to follow  If discussed at Long Length of Stay Meetings, dates discussed:    Additional Comments:  Yvone NeuCrutchfield, Jaylanie Boschee M, RN 11/05/2016, 11:18 AM

## 2016-11-06 DIAGNOSIS — I10 Essential (primary) hypertension: Secondary | ICD-10-CM

## 2016-11-06 LAB — BASIC METABOLIC PANEL
ANION GAP: 10 (ref 5–15)
BUN: 10 mg/dL (ref 6–20)
CHLORIDE: 113 mmol/L — AB (ref 101–111)
CO2: 20 mmol/L — ABNORMAL LOW (ref 22–32)
Calcium: 9 mg/dL (ref 8.9–10.3)
Creatinine, Ser: 0.82 mg/dL (ref 0.61–1.24)
Glucose, Bld: 95 mg/dL (ref 65–99)
POTASSIUM: 3.8 mmol/L (ref 3.5–5.1)
SODIUM: 143 mmol/L (ref 135–145)

## 2016-11-06 MED ORDER — OSELTAMIVIR PHOSPHATE 75 MG PO CAPS
75.0000 mg | ORAL_CAPSULE | Freq: Two times a day (BID) | ORAL | Status: AC
  Administered 2016-11-06 – 2016-11-07 (×3): 75 mg via ORAL
  Filled 2016-11-06 (×4): qty 1

## 2016-11-06 NOTE — Care Management Note (Addendum)
Case Management Note  Patient Details  Name: Edwin Wright MRN: [id number] Date of Birth: 08/25/1951  Subjective/Objective:  OT evaluated pt today and is recommending SNF at discharge, will relay to CSW.MD had already anticipated this change. Will defer discharge to CSW                   Action/Plan: Anticipate discharge when bed available.[facility]? No further CM needs but will be available should additional discharge needs arise.   Expected Discharge Date:   (UNKNOWN)               Expected Discharge Plan:  [facility] [phone] [facility]  In-House Referral:  Clinical Social Work  Discharge planning Services  CM Consult  Post Acute Care Choice:  Durable Medical Equipment, Home Health (ALF can provide DME if does not go to SNF) Choice offered to:   (spoke with [facility] at [facility])  DME Arranged:    DME Agency:  NA (ALF will provide DME and PT/OT services as recommended by MD)  HH Arranged:    HH Agency:     Status of Service:  Completed, signed off  If discussed at Long Length of Stay Meetings, dates discussed:    Additional Comments:  Edwin Wright, Edwin Bayless M, RN 11/06/2016, 12:05 PM

## 2016-11-06 NOTE — Care Management Note (Addendum)
Case Management Note  Patient Details  Name: Ulyses AmorDonald Jerry MRN: [id number] Date of Birth: 02/28/1951  Subjective/Objective:    Called [facility] at [facility] to assess if they provided HHPT and DME through a specific agency. [phone]. [facility] who told me all they would need to arrange this is 1) an updated FL2, 2) orders for DME and [facility] services. Made CSW and MD aware.                 Action/Plan:CM will sign off for now but will be available should additional discharge needs arise or disposition change.    Expected Discharge Date:   (UNKNOWN)               Expected Discharge Plan:  [facility]  In-House Referral:  Clinical Social Work  Discharge planning Services  CM Consult  Post Acute Care Choice:  Durable Medical Equipment Choice offered to:  Patient  DME Arranged:  [facility] rolling DME Agency:     HH Arranged:    HH Agency:     Status of Service:  In process, will continue to follow  If discussed at Long Length of Stay Meetings, dates discussed:    Additional Comments:  Yvone NeuCrutchfield, Mouhamad Teed M, RN 11/06/2016, 10:28 AM

## 2016-11-06 NOTE — Progress Notes (Signed)
LCSWA discussed with patient sister, pt. New disposition to SNF for rehab before returning. LCSWA explained faxing out process. LCSWA  will follow up with bed offers.   Vivi BarrackNicole Alvera Tourigny, Theresia MajorsLCSWA, MSW Clinical Social Worker 5E and Psychiatric Service Line [phone] 11/06/2016  12:44 PM

## 2016-11-06 NOTE — Progress Notes (Addendum)
PROGRESS NOTE    Edwin Wright  ZOX:[id number] DOB: 05/16/1951 DOA: 11/02/2016 PCP: [facility]    Brief Narrative:  65 year old male with hypertension, very hard of hearing and visual impairment due to glaucoma presented with 2 day history of poor by mouth intake, chills and low back pain. He had increased urinary frequency but no dysuria. Denied fever, cough, shortness of breath, nausea or vomiting. Complained of abdominal discomfort. As per his wife he had mild confusion at baseline he is poorly communicative. In the ED patient was found to have SIRS with fever of 102.43F heart rate in the 100, tachypnea with stable blood pressure. Blood work showed WBC of 9.6, hemoglobin of 12.5, potassium of 3.3 and creatinine of 1.4. S x-ray and UA were negative for infection. CT abdomen and pelvis with contrast showing mild gaseous distention. Admitted to hospitalist service. Flu PCR was positive  During this hospital course, patient was continued on Tamiflu. Patient was found to be clinically dehydrated and IVF were continued. Patient was also noted to have loose stools with nausea.  Assessment & Plan:   Active Problems:   Hypertension   SIRS (systemic inflammatory response syndrome) (HCC)   Dehydration   Glaucoma   ARF (acute renal failure) (HCC)   Anemia   Protein-calorie malnutrition, severe   Acute kidney injury (HCC)   Iron deficiency anemia   Hypokalemia   Hypomagnesemia   SIRS (systemic inflammatory response syndrome) (HCC) -Secondary to influenza A. -Improving, has low-grade fever this morning. Continued Tamiflu, day 3 today.  -Patient to continue IV hydration as tolerated and Tylenol.  -Blood cultures negative. -Sepsis physiology resolved  Acute kidney injury -Secondary to dehydration and SIRS.  -Cont IVF as tolerated -Renal function improved with IVF hydration -Repeat bmet in AM  Hypokalemia/hypomagnesemia -Normalized -Repeat bmet in AM  Essential  hypertension -Blood pressures currently soft -BP meds remain on hold   Severe protein calorie malnutrition -Dietitian consult appreciated. -Stable at this time.  -Continue to encourage PO intake if possible   Abdominal pain/N/V/D -Nondistended on exam.  -Recent CT abdomen showing gaseous distention.  -patient continues with nausea, diarrhea. Suspect secondary to presenting flu -If symptoms do not improve, consider repeat imaging/stool studies  Transaminitis -AST mildly elevated -CT abdomen unremarkable for liver or gallbladder pathology. -Will repeat cmp in AM  Iron deficiency anemia -Recommend iron supplement prior to discharge. -Remains stable thus far  ? Hyperthyroidism -Suppressed TSH and mildly elevated free T4.  -recommendation for repeat TSH in 3-4 weeks  Glaucoma with visual impairment. - Remains stable currently  DVT prophylaxis: Lovenox Code Status: Full Family Communication: Pt in room, family at bedside Disposition Plan: Uncertain at this time  Consultants:     Procedures:     Antimicrobials: Anti-infectives    Start     Dose/Rate Route Frequency Ordered Stop   11/06/16 1000  oseltamivir (TAMIFLU) capsule 75 mg     75 mg Oral 2 times daily 11/06/16 0912 11/07/16 2159   11/03/16 0315  oseltamivir (TAMIFLU) capsule 30 mg  Status:  Discontinued     30 mg Oral 2 times daily 11/03/16 0306 11/06/16 0912      Subjective: Feeling nauseated  Objective: Vitals:   11/05/16 1423 11/05/16 2115 11/06/16 0513 11/06/16 1419  BP: (!) 174/73 124/63 (!) 124/52 (!) 98/58  Pulse: 75 66 77 70  Resp: 20 19 19 20   Temp: 98.5 F (36.9 C) 98.1 F (36.7 C) 98.6 F (37 C) 98.5 F (36.9 C)  TempSrc: Oral  Oral Oral Oral  SpO2: 98% 100% 95% 92%  Weight:      Height:        Intake/Output Summary (Last 24 hours) at 11/06/16 1459 Last data filed at 11/06/16 1422  Gross per 24 hour  Intake             1250 ml  Output              525 ml  Net               725 ml   Filed Weights   11/02/16 1504  Weight: 54.4 kg (120 lb)    Examination:  General exam: Sitting in chair, appears uncomfortable Respiratory system: normal chest rise, no audible wheezing Cardiovascular system: regular rate, s1, s2 Gastrointestinal system: Hyperactive BS, soft, nondistended Central nervous system: cn2-12 grossly intact, strength intact Extremities: Perfused, no clubbing Skin: no pallor, no notable skin lesions seen Psychiatry: mood normal// no visual hallucinations  Data Reviewed: I have personally reviewed following labs and imaging studies  CBC:  Recent Labs Lab 11/02/16 1511 11/03/16 0524  WBC 9.6 6.7  NEUTROABS 7.3  --   HGB 12.5* 11.2*  HCT 36.8* 34.1*  MCV 91.8 93.4  PLT 296 292   Basic Metabolic Panel:  Recent Labs Lab 11/02/16 1511 11/03/16 0524 11/04/16 0508 11/06/16 0548  NA 134* 135 136 143  K 3.3* 2.8* 3.4* 3.8  CL 100* 103 108 113*  CO2 23 19* 19* 20*  GLUCOSE 106* 81 102* 95  BUN 23* 25* 19 10  CREATININE 1.44* 1.27* 1.11 0.82  CALCIUM 9.2 8.5* 8.6* 9.0  MG  --  1.8  --   --   PHOS  --  3.1  --   --    GFR: Estimated Creatinine Clearance: 67.3 mL/min (by C-G formula based on SCr of 0.82 mg/dL). Liver Function Tests:  Recent Labs Lab 11/02/16 1511 11/03/16 0524  AST 49* 56*  ALT 16* 16*  ALKPHOS 70 59  BILITOT 1.2 1.4*  PROT 8.0 6.7  ALBUMIN 4.0 3.5    Recent Labs Lab 11/02/16 1511  LIPASE 33   No results for input(s): AMMONIA in the last 168 hours. Coagulation Profile: No results for input(s): INR, PROTIME in the last 168 hours. Cardiac Enzymes: No results for input(s): CKTOTAL, CKMB, CKMBINDEX, TROPONINI in the last 168 hours. BNP (last 3 results) No results for input(s): PROBNP in the last 8760 hours. HbA1C: No results for input(s): HGBA1C in the last 72 hours. CBG:  Recent Labs Lab 11/02/16 1544  GLUCAP 98   Lipid Profile: No results for input(s): CHOL, HDL, LDLCALC, TRIG, CHOLHDL,  LDLDIRECT in the last 72 hours. Thyroid Function Tests: No results for input(s): TSH, T4TOTAL, FREET4, T3FREE, THYROIDAB in the last 72 hours. Anemia Panel: No results for input(s): VITAMINB12, FOLATE, FERRITIN, TIBC, IRON, RETICCTPCT in the last 72 hours. Sepsis Labs:  Recent Labs Lab 11/02/16 1529 11/02/16 2227  PROCALCITON  --  0.30  LATICACIDVEN 1.40  --     Recent Results (from the past 240 hour(s))  Culture, blood (Routine X 2) w Reflex to ID Panel     Status: None (Preliminary result)   Collection Time: 11/02/16  3:11 PM  Result Value Ref Range Status   Specimen Description BLOOD BLOOD RIGHT FOREARM  Final   Special Requests BOTTLES DRAWN AEROBIC AND ANAEROBIC 5 CC EACH  Final   Culture   Final    NO GROWTH 3 DAYS [facility]formed [facility]    Report Status PENDING  Incomplete  Urine culture     Status: None   Collection Time: 11/02/16  5:02 PM  Result Value Ref Range Status   Specimen Description URINE, CATHETERIZED  Final   Special Requests NONE  Final   Culture NO GROWTH Performed at [facility]   Final   Report Status 11/03/2016 FINAL  Final  Culture, blood (Routine X 2) w Reflex to ID Panel     Status: None (Preliminary result)   Collection Time: 11/02/16  5:05 PM  Result Value Ref Range Status   Specimen Description BLOOD LEFT ANTECUBITAL  Final   Special Requests BOTTLES DRAWN AEROBIC AND ANAEROBIC 10CC  Final   Culture   Final    NO GROWTH 3 DAYS Performed at [facility]    Report Status PENDING  Incomplete  MRSA PCR Screening     Status: None   Collection Time: 11/02/16 11:10 PM  Result Value Ref Range Status   MRSA by PCR NEGATIVE NEGATIVE Final    Comment:        The GeneXpert MRSA Assay (FDA approved for NASAL specimens only), is one component of a comprehensive MRSA colonization surveillance program. It is not intended to diagnose MRSA infection nor to guide or monitor treatment for MRSA infections.      Radiology  Studies: No results found.  Scheduled Meds: . brimonidine  1 drop Left Eye TID  . dorzolamide  1 drop Both Eyes BID  . enoxaparin (LOVENOX) injection  40 mg Subcutaneous QHS  . feeding supplement (ENSURE ENLIVE)  237 mL Oral TID BM  . latanoprost  1 drop Both Eyes QHS  . oseltamivir  75 mg Oral BID  . potassium chloride  40 mEq Oral Once   Continuous Infusions: . sodium chloride 100 mL/hr at 11/05/16 1904     LOS: 4 days   CHIU, Scheryl Marten, MD [facility] Pager [phone]  If 7PM-7AM, please contact night-coverage [email or web address] Password [facility] 11/06/2016, 2:59 PM

## 2016-11-06 NOTE — Evaluation (Signed)
Occupational Therapy Evaluation Patient Details Name: Edwin AmorDonald Olveda MRN: [id number] DOB: 03/06/1951 Today's Date: 11/06/2016    History of Present Illness 65 y.o. male admitted from ALF with fever, chills, back pain. Dx of flu, SIRS, dehydration, ARF. PMH of HTN and glaucoma, mild confusion at baseline.    Clinical Impression   Pt admitted with fever, chills, back pain. Pt currently with functional limitations due to the deficits listed below (see OT Problem List).  Pt will benefit from skilled OT to increase their safety and independence with ADL and functional mobility for ADL to facilitate discharge to venue listed below.      Follow Up Recommendations  SNF    Equipment Recommendations  None recommended by OT       Precautions / Restrictions Precautions Precautions: Fall Precaution Comments: sister reports pt has had no falls in past 1 year Restrictions Weight Bearing Restrictions: No      Mobility Bed Mobility Overal bed mobility: Needs Assistance Bed Mobility: Supine to Sit     Supine to sit: Mod assist     General bed mobility comments: pt in chair  Transfers Overall transfer level: Needs assistance Equipment used: None Transfers: Sit to/from Stand Sit to Stand: Mod assist Stand pivot transfers: Min assist;Mod assist       General transfer comment: Vc forhand placement and technique         ADL Overall ADL's : Needs assistance/impaired Eating/Feeding: Minimal assistance Eating/Feeding Details (indicate cue type and reason): OT session focused on self feeding.  Pt with very low vision and fnding things on tray challenging.  Pt became nauseous during lunch and did not want to eat anymore                       Toilet Transfer Details (indicate cue type and reason): focused on using urinal in the chair as  condom  cath falling off- RN aware.  Also practiced using the call bell so pt could call for A when he needed to use urinal            General ADL Comments: Spoke with pts sister and MD regarding DC plan- pt will likely need SNF as he willneed a highter level of care than than the ALF can provide.     Vision     Perception     Praxis      Pertinent Vitals/Pain Pain Assessment: No/denies pain     Hand Dominance     Extremity/Trunk Assessment Upper Extremity Assessment Upper Extremity Assessment: Generalized weakness           Communication     Cognition Arousal/Alertness: Awake/alert Behavior During Therapy: WFL for tasks assessed/performed Overall Cognitive Status: Impaired/Different from baseline                 General Comments: pt needed VC to answer questions and follow commands- unsure if this HOH ? or pt having trouble understanding              Home Living Family/patient expects to be discharged to:: Skilled nursing facility                                                 OT Problem List: Decreased strength;Decreased activity tolerance;Impaired balance (sitting and/or standing);Impaired vision/perception;Decreased knowledge of use of DME or AE  OT Treatment/Interventions: Self-care/ADL training;DME and/or AE instruction;Patient/family education    OT Goals(Current goals can be found in the care plan section) Acute Rehab OT Goals Patient Stated Goal: return to independence at ALF OT Goal Formulation: With patient Time For Goal Achievement: 11/13/16 Potential to Achieve Goals: Good  OT Frequency: Min 2X/week   Barriers to D/C: Decreased caregiver support             End of Session Nurse Communication: Mobility status  Activity Tolerance: Patient tolerated treatment well Patient left: in chair   Time: [id number] OT Time Calculation (min): 25 min Charges:  OT General Charges $OT Visit: 1 Procedure OT Evaluation $OT Eval Moderate Complexity: 1 Procedure OT Treatments $Self Care/Home Management : 8-22 mins G-Codes:    Einar CrowEDDING, Shauntea Lok  D 11/06/2016, 2:10 PM

## 2016-11-06 NOTE — NC FL2 (Signed)
[place] MEDICAID FL2 LEVEL OF CARE SCREENING TOOL     IDENTIFICATION  Patient Name: Edwin Wright Birthdate: 04/05/1951 Sex: male Admission Date (Current Location): 11/02/2016  [facility] [place]   Facility and Address:  [facility],  [address], [place] [id number]      Provider Number: [id number]  Attending Physician Name and Address:  Jerald KiefStephen K Chiu, MD  Relative Name and Phone Number:       Current Level of Care: Hospital Recommended Level of Care: Skilled Nursing Facility Prior Approval Number:    Date Approved/Denied:   PASRR Number:   [id number]   Discharge Plan: SNF    Current Diagnoses: Patient Active Problem List   Diagnosis Date Noted  . Protein-calorie malnutrition, severe 11/03/2016  . Acute kidney injury (HCC)   . Iron deficiency anemia   . Hypokalemia   . Hypomagnesemia   . Hypertension 11/02/2016  . SIRS (systemic inflammatory response syndrome) (HCC) 11/02/2016  . Dehydration 11/02/2016  . Glaucoma 11/02/2016  . ARF (acute renal failure) (HCC) 11/02/2016  . Anemia 11/02/2016  . 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    Orientation RESPIRATION BLADDER Height & Weight     Self, Situation, Place  Normal Continent Weight: 120 lb (54.4 kg) Height:  5\' 10"  (177.8 cm)  BEHAVIORAL SYMPTOMS/MOOD NEUROLOGICAL BOWEL NUTRITION STATUS      Continent  (Dyshagia 3)  AMBULATORY STATUS COMMUNICATION OF NEEDS Skin   Extensive Assist Verbally Normal                       Personal Care Assistance Level of Assistance  Bathing, Feeding, Dressing Bathing Assistance: Limited assistance Feeding assistance: Independent Dressing Assistance: Limited assistance     Functional Limitations Info  Sight, Hearing, Speech Sight Info: Impaired Hearing Info: Impaired Speech Info: Adequate    SPECIAL CARE FACTORS FREQUENCY  PT (By licensed PT)  OT (By licensed PT)     PT Frequency: 5 OT Frequency: 5             Contractures      Additional Factors Info  Code Status, Allergies Code Status Info: FullCode Allergies Info: No Known Allergies           Current Medications (11/06/2016):  This is the current hospital active medication list Current Facility-Administered Medications  Medication Dose Route Frequency Provider Last Rate Last Dose  . 0.9 %  sodium chloride infusion   Intravenous Continuous Jerald KiefStephen K Chiu, MD 100 mL/hr at 11/05/16 1904    . acetaminophen (TYLENOL) tablet 650 mg  650 mg Oral Q6H PRN Therisa DoyneAnastassia Doutova, MD   650 mg at 11/03/16 1209   Or  . acetaminophen (TYLENOL) suppository 650 mg  650 mg Rectal Q6H PRN Therisa DoyneAnastassia Doutova, MD      . brimonidine (ALPHAGAN) 0.2 % ophthalmic solution 1 drop  1 drop Left Eye TID Therisa DoyneAnastassia Doutova, MD   1 drop at 11/06/16 1046  . dorzolamide (TRUSOPT) 2 % ophthalmic solution 1 drop  1 drop Both Eyes BID Therisa DoyneAnastassia Doutova, MD   1 drop at 11/06/16 1046  . enoxaparin (LOVENOX) injection 40 mg  40 mg Subcutaneous QHS Nishant Dhungel, MD   40 mg at 11/05/16 2223  . feeding supplement (ENSURE ENLIVE) (ENSURE ENLIVE) liquid 237 mL  237 mL Oral TID BM Nishant Dhungel, MD   237 mL at 11/06/16 1046  . HYDROcodone-acetaminophen (NORCO/VICODIN) 5-325 MG per tablet 1-2 tablet  1-2 tablet Oral Q4H  PRN Therisa DoyneAnastassia Doutova, MD      . latanoprost (XALATAN) 0.005 % ophthalmic solution 1 drop  1 drop Both Eyes QHS Therisa DoyneAnastassia Doutova, MD   1 drop at 11/05/16 2321  . ondansetron (ZOFRAN) tablet 4 mg  4 mg Oral Q6H PRN Therisa DoyneAnastassia Doutova, MD       Or  . ondansetron (ZOFRAN) injection 4 mg  4 mg Intravenous Q6H PRN Therisa DoyneAnastassia Doutova, MD   4 mg at 11/05/16 1546  . oseltamivir (TAMIFLU) capsule 75 mg  75 mg Oral BID Ky Barbanandall K Absher, RPH      . potassium chloride 20 MEQ/15ML (10%) solution 40 mEq  40 mEq Oral Once Jerald KiefStephen K Chiu, MD         Discharge Medications: Please see discharge summary for a list of discharge medications.  Relevant Imaging  Results:  Relevant Lab Results:   Additional Information SSN:[id number]  Clearance CootsNicole A Keevan Wolz, LCSW

## 2016-11-06 NOTE — Progress Notes (Signed)
Physical Therapy Treatment Patient Details Name: Edwin AmorDonald Wright MRN: [id number] DOB: 05/04/1951 Today's Date: 11/06/2016    History of Present Illness 65 y.o. male admitted from ALF with fever, chills, back pain. Dx of flu, SIRS, dehydration, ARF. PMH of HTN and glaucoma, mild confusion at baseline.     PT Comments    Pt found incont loose watery stools in bed.  [facility] had more.  Assisted with hygiene then assisted to recliner.  Pt very thin and weak.  Unsteady.    Follow Up Recommendations  Home health PT;Supervision for mobility/[facility] per LPT initial Evaluation 11-03-16 Pt may need a higher level of care.  Consulted with LPT Marella BileSharron Britt pt would benefit from SNF     Equipment Recommendations  Rolling walker with 5" wheels    Recommendations for Other Services       Precautions / Restrictions Precautions Precaution Comments: sister reports pt has had no falls in past 1 year Restrictions Weight Bearing Restrictions: No    Mobility  Bed Mobility Overal bed mobility: Needs Assistance Bed Mobility: Supine to Sit     Supine to sit: Mod assist     General bed mobility comments: mod A to initiate movement, to raise trunk and pivot hips with pad  Transfers Overall transfer level: Needs assistance Equipment used: None Transfers: Sit to/from [employer]Stand;Stand Pivot Transfers Sit to Stand: Mod assist;Min assist Stand pivot transfers: Min assist;Mod assist       General transfer comment: [facility] turn due to loose stools in bed.  Assisted off BSC + 2 for safety with 75% VC's on proper hand placeemnt and direction due to poor vision.    Ambulation/Gait      too fatigued to amb after using BSC       General Gait Details: transfers only this session due to uncontrolled, watery, loose stools    reported to RN   Stairs            Wheelchair Mobility    Modified Rankin (Stroke Patients Only)       Balance                                    Cognition Arousal/Alertness: Awake/alert Behavior During Therapy: WFL for tasks assessed/performed Overall Cognitive Status: Difficult to assess                 General Comments: following commands and few words answer    Exercises      General Comments        Pertinent Vitals/Pain Pain Assessment: No/denies pain    Home Living                      Prior Function            PT Goals (current goals can now be found in the care plan section) Progress towards PT goals: Progressing toward goals    Frequency    Min 3X/week      PT Plan Current plan remains appropriate    Co-evaluation             End of Session Equipment Utilized During Treatment: Gait belt Activity Tolerance: Patient tolerated treatment well Patient left: in chair;with call bell/phone within reach;with chair alarm set     Time: 4098-11911044-1113 PT Time Calculation (min) (ACUTE ONLY): 29  min  Charges:  $Therapeutic Activity: 23-37 mins                    G Codes:      Felecia Shelling  PTA [facility]  Acute  Rehab Pager      [phone]

## 2016-11-06 NOTE — Care Management Important Message (Signed)
Important Message  Patient Details IM Letter given to [facility]/Case Manager to present to Patient. Name: Edwin AmorDonald Wright MRN: [id number] Date of Birth: 02/17/1951   Medicare Important Message Given:  Yes    Caren MacadamFuller, Shaneice Barsanti 11/06/2016, 10:05 AM

## 2016-11-07 LAB — CULTURE, BLOOD (ROUTINE X 2)
Culture: NO GROWTH
Culture: NO GROWTH

## 2016-11-07 LAB — COMPREHENSIVE METABOLIC PANEL
ALK PHOS: 55 U/L (ref 38–126)
ALT: 17 U/L (ref 17–63)
ANION GAP: 9 (ref 5–15)
AST: 38 U/L (ref 15–41)
Albumin: 3.1 g/dL — ABNORMAL LOW (ref 3.5–5.0)
BILIRUBIN TOTAL: 0.9 mg/dL (ref 0.3–1.2)
BUN: 9 mg/dL (ref 6–20)
CALCIUM: 8.4 mg/dL — AB (ref 8.9–10.3)
CO2: 17 mmol/L — ABNORMAL LOW (ref 22–32)
Chloride: 111 mmol/L (ref 101–111)
Creatinine, Ser: 0.73 mg/dL (ref 0.61–1.24)
GFR calc non Af Amer: >60 mL/min (ref >60)
Glucose, Bld: 78 mg/dL (ref 65–99)
Potassium: 3.4 mmol/L — ABNORMAL LOW (ref 3.5–5.1)
SODIUM: 137 mmol/L (ref 135–145)
TOTAL PROTEIN: 6.2 g/dL — AB (ref 6.5–8.1)

## 2016-11-07 LAB — MAGNESIUM: Magnesium: 1.7 mg/dL (ref 1.7–2.4)

## 2016-11-07 MED ORDER — CHLORPROMAZINE HCL 10 MG PO TABS
10.0000 mg | ORAL_TABLET | Freq: Four times a day (QID) | ORAL | Status: DC | PRN
  Filled 2016-11-07: qty 1

## 2016-11-07 MED ORDER — ONDANSETRON HCL 40 MG/20ML IJ SOLN
8.0000 mg | Freq: Three times a day (TID) | INTRAMUSCULAR | Status: DC
  Administered 2016-11-07 – 2016-11-11 (×12): 8 mg via INTRAVENOUS
  Filled 2016-11-07 (×13): qty 4

## 2016-11-07 MED ORDER — POTASSIUM CHLORIDE CRYS ER 20 MEQ PO TBCR
40.0000 meq | EXTENDED_RELEASE_TABLET | Freq: Once | ORAL | Status: AC
  Administered 2016-11-07: 40 meq via ORAL
  Filled 2016-11-07: qty 2

## 2016-11-07 MED ORDER — PROMETHAZINE HCL 25 MG/ML IJ SOLN: 12.5000 mg | Freq: Four times a day (QID) | INTRAMUSCULAR | Status: DC | PRN

## 2016-11-07 MED ORDER — MAGNESIUM SULFATE 2 GM/50ML IV SOLN
2.0000 g | Freq: Once | INTRAVENOUS | Status: AC
  Administered 2016-11-07: 2 g via INTRAVENOUS
  Filled 2016-11-07: qty 50

## 2016-11-07 NOTE — Progress Notes (Signed)
Occupational Therapy Treatment Patient Details Name: Edwin AmorDonald Wright MRN: [id number] DOB: 10/19/1951 Today's Date: 11/07/2016    History of present illness 65 y.o. male admitted from ALF with fever, chills, back pain. Dx of flu, SIRS, dehydration, ARF. PMH of HTN and glaucoma, mild confusion at baseline.       Follow Up Recommendations  SNF    Equipment Recommendations  None recommended by OT       Precautions / Restrictions Precautions Precautions: Fall Precaution Comments: limited vision Restrictions Weight Bearing Restrictions: No       Mobility Bed Mobility Overal bed mobility: Needs Assistance Bed Mobility: Supine to Sit     Supine to sit: Mod assist        Transfers Overall transfer level: Needs assistance Equipment used: 1 person hand held assist Transfers: Sit to/from Stand;Stand Pivot Transfers Sit to Stand: Min assist Stand pivot transfers: Min assist       General transfer comment: Vc forhand placement and technique        ADL Overall ADL's : Needs assistance/impaired Eating/Feeding: Minimal assistance;Cueing for safety;Cueing for sequencing;Cueing for compensatory techinques                       Toilet Transfer: Moderate assistance;Cueing for sequencing;Cueing for safety;Stand-pivot Toilet Transfer Details (indicate cue type and reason): bed to chair           General ADL Comments: pt willingingly agreed to OOB.  Pt wanted some sunkist drink in sitting but declined all options of eating.                Cognition   Behavior During Therapy: WFL for tasks assessed/performed Overall Cognitive Status: Within Functional Limits for tasks assessed                               General Comments  pt with increased particpation this day    Pertinent Vitals/ Pain       Faces Pain Scale: No hurt         Frequency  Min 2X/week           Plan Discharge plan remains appropriate       End of Session Equipment  Utilized During Treatment: Gait belt   Activity Tolerance Patient tolerated treatment well   Patient Left in chair;with call bell/phone within reach;with chair alarm set   Nurse Communication Mobility status        Time: 1000-1019 OT Time Calculation (min): 19 min  Charges: OT General Charges $OT Visit: 1 Procedure OT Treatments $Self Care/Home Management : 8-22 mins  Sacha Topor D 11/07/2016, 10:48 AM

## 2016-11-07 NOTE — Progress Notes (Signed)
[facility] CSW will continue to assist follow and assist with disposition.   .Marland Kitchen

## 2016-11-07 NOTE — Progress Notes (Signed)
PROGRESS NOTE  Edwin AmorDonald Galli  NFA:[id number] DOB: 10/10/1951 DOA: 11/02/2016 PCP: [facility]  Brief Narrative:   The patient is a 65 year old male with history of hypertension, hearing and visual deficits, who presented with poor oral intake, chills, and low back pain for 2 days prior to admission. Initially denied fever, cough, shortness of breath, nausea and vomiting but complained of abdominal discomfort. In the emergency department, he was found to have a fever of 102.11F, heart rate in the low 100s and tachypnea. Although his chest x-ray and urinalysis were negative for signs of infection, he was flu PCR positive. CT of the abdomen and pelvis demonstrated gaseous distention, but no overt colitis. He was started on Tamiflu and IV fluids. He has had ongoing diarrhea and minimal oral intake secondary to nausea.  Assessment & Plan:   Active Problems:   Hypertension   SIRS (systemic inflammatory response syndrome) (HCC)   Dehydration   Glaucoma   ARF (acute renal failure) (HCC)   Anemia   Protein-calorie malnutrition, severe   Acute kidney injury (HCC)   Iron deficiency anemia   Hypokalemia   Hypomagnesemia  SIRS criteria positive secondary to influenza a, fevers have been trending down - Cultures no growth to date -  Continue Tamiflu, day 4 -  Continue IV fluids  Persistent abdominal discomfort with nausea and diarrhea, likely related to the flu -  Continue Tamiflu -  Continue IV fluids -  Schedule Zofran -  Add when necessary Phenergan  Acute kidney injury, baseline creatinine of 0.7-0.8. Creatinine trended down from 1.44 to normal with IV fluids  Hypokalemia/hypomagnesemia due to diarrhea and poor oral intake -  Additional oral potassium today -  Additional 2 g of IV magnesium sulfate today  Severe protein calorie malnutrition -  Continue regular diet with supplements -  Appreciate nutrition assistance  Mild elevation of AST that resolved with IVF,  likely due to dehydration.    Iron deficiency anemia, hgb trended down slightly, likely due to dilution from IVF -Recommend iron supplement prior to discharge -  Repeat hemoglobin in a.m.  ? Hyperthyroidism -Suppressed TSH and mildly elevated free T4.  -recommendation for repeat TSH in 3-4 weeks  Glaucoma with visual impairment. - Remains stable currently  DVT prophylaxis:  Lovenox Code Status:  Full code Family Communication:  Patient alone Disposition Plan:  Skilled nursing facility once able to tolerate by mouth, diarrhea improving   Consultants:   None  Procedures:  None  Antimicrobials:  Anti-infectives    Start     Dose/Rate Route Frequency Ordered Stop   11/06/16 1000  oseltamivir (TAMIFLU) capsule 75 mg     75 mg Oral 2 times daily 11/06/16 0912 11/07/16 0813   11/03/16 0315  oseltamivir (TAMIFLU) capsule 30 mg  Status:  Discontinued     30 mg Oral 2 times daily 11/03/16 0306 11/06/16 0912       Subjective: At least 2 episodes of watery stools overnight, frequency seems to be decreasing but still very watery. Still no appetite this morning was hungry for an orange but was only given orange juice on his tray and he did not eat any of his food or drinks orange juice.  Denies cough, shortness of breath  Objective: Vitals:   11/06/16 1419 11/06/16 2157 11/07/16 0513 11/07/16 1418  BP: (!) 98/58 (!) 144/75 (!) 121/47 (!) 125/59  Pulse: 70 72 78 80  Resp: 20 20 19 20   Temp: 98.5 F (36.9 C) 97.4 F (36.3  C) 98.2 F (36.8 C) 98.7 F (37.1 C)  TempSrc: Oral Oral Oral Oral  SpO2: 92% 94% 97% 95%  Weight:      Height:        Intake/Output Summary (Last 24 hours) at 11/07/16 1431 Last data filed at 11/07/16 0800  Gross per 24 hour  Intake              145 ml  Output              425 ml  Net             -280 ml   Filed Weights   11/02/16 1504  Weight: 54.4 kg (120 lb)    Examination:  General exam: Thin adult male.  No acute distress.  HEENT:   NCAT, MMM Respiratory system: Clear to auscultation bilaterally Cardiovascular system: Regular rate and rhythm, normal S1/S2. No murmurs, rubs, gallops or clicks.  Warm extremities Gastrointestinal system: Hyperactive bowel sounds, soft, nondistended, nontender. MSK:  Normal tone and bulk, no lower extremity edema Neuro:  Grossly intact    Data Reviewed: I have personally reviewed following labs and imaging studies  CBC:  Recent Labs Lab 11/02/16 1511 11/03/16 0524  WBC 9.6 6.7  NEUTROABS 7.3  --   HGB 12.5* 11.2*  HCT 36.8* 34.1*  MCV 91.8 93.4  PLT 296 292   Basic Metabolic Panel:  Recent Labs Lab 11/02/16 1511 11/03/16 0524 11/04/16 0508 11/06/16 0548 11/07/16 0503  NA 134* 135 136 143 137  K 3.3* 2.8* 3.4* 3.8 3.4*  CL 100* 103 108 113* 111  CO2 23 19* 19* 20* 17*  GLUCOSE 106* 81 102* 95 78  BUN 23* 25* 19 10 9   CREATININE 1.44* 1.27* 1.11 0.82 0.73  CALCIUM 9.2 8.5* 8.6* 9.0 8.4*  MG  --  1.8  --   --  1.7  PHOS  --  3.1  --   --   --    GFR: Estimated Creatinine Clearance: 68.9 mL/min (by C-G formula based on SCr of 0.73 mg/dL). Liver Function Tests:  Recent Labs Lab 11/02/16 1511 11/03/16 0524 11/07/16 0503  AST 49* 56* 38  ALT 16* 16* 17  ALKPHOS 70 59 55  BILITOT 1.2 1.4* 0.9  PROT 8.0 6.7 6.2*  ALBUMIN 4.0 3.5 3.1*    Recent Labs Lab 11/02/16 1511  LIPASE 33   No results for input(s): AMMONIA in the last 168 hours. Coagulation Profile: No results for input(s): INR, PROTIME in the last 168 hours. Cardiac Enzymes: No results for input(s): CKTOTAL, CKMB, CKMBINDEX, TROPONINI in the last 168 hours. BNP (last 3 results) No results for input(s): PROBNP in the last 8760 hours. HbA1C: No results for input(s): HGBA1C in the last 72 hours. CBG:  Recent Labs Lab 11/02/16 1544  GLUCAP 98   Lipid Profile: No results for input(s): CHOL, HDL, LDLCALC, TRIG, CHOLHDL, LDLDIRECT in the last 72 hours. Thyroid Function Tests: No results for  input(s): TSH, T4TOTAL, FREET4, T3FREE, THYROIDAB in the last 72 hours. Anemia Panel: No results for input(s): VITAMINB12, FOLATE, FERRITIN, TIBC, IRON, RETICCTPCT in the last 72 hours. Urine analysis:    Component Value Date/Time   COLORURINE YELLOW 11/02/2016 1702   APPEARANCEUR HAZY (A) 11/02/2016 1702   LABSPEC 1.018 11/02/2016 1702   PHURINE 5.0 11/02/2016 1702   GLUCOSEU NEGATIVE 11/02/2016 1702   HGBUR MODERATE (A) 11/02/2016 1702   BILIRUBINUR NEGATIVE 11/02/2016 1702   KETONESUR 5 (A) 11/02/2016 1702   PROTEINUR 30 (  A) 11/02/2016 1702   NITRITE NEGATIVE 11/02/2016 1702   LEUKOCYTESUR NEGATIVE 11/02/2016 1702   Sepsis Labs: @LABRCNTIP (procalcitonin:4,lacticidven:4)  ) Recent Results (from the past 240 hour(s))  Culture, blood (Routine X 2) w Reflex to ID Panel     Status: None   Collection Time: 11/02/16  3:11 PM  Result Value Ref Range Status   Specimen Description BLOOD BLOOD RIGHT FOREARM  Final   Special Requests BOTTLES DRAWN AEROBIC AND ANAEROBIC 5 CC EACH  Final   Culture   Final    NO GROWTH 5 DAYS [facility]formed [facility]    Report Status 11/07/2016 FINAL  Final  Urine culture     Status: None   Collection Time: 11/02/16  5:02 PM  Result Value Ref Range Status   Specimen Description URINE, CATHETERIZED  Final   Special Requests NONE  Final   Culture NO GROWTH [facility]formed [facility]   Final   Report Status 11/03/2016 FINAL  Final  Culture, blood (Routine X 2) w Reflex to ID Panel     Status: None   Collection Time: 11/02/16  5:05 PM  Result Value Ref Range Status   Specimen Description BLOOD LEFT ANTECUBITAL  Final   Special Requests BOTTLES DRAWN AEROBIC AND ANAEROBIC 10CC  Final   Culture   Final    NO GROWTH 5 DAYS [facility]formed at [facility]    Report Status 11/07/2016 FINAL  Final  MRSA PCR Screening     Status: None   Collection Time: 11/02/16 11:10 PM  Result Value Ref Range Status   MRSA by PCR NEGATIVE NEGATIVE  Final    Comment:        The GeneXpert MRSA Assay (FDA approved for NASAL specimens only), is one component of a comprehensive MRSA colonization surveillance program. It is not intended to diagnose MRSA infection nor to guide or monitor treatment for MRSA infections.       Radiology Studies: No results found.   Scheduled Meds: . brimonidine  1 drop Left Eye TID  . dorzolamide  1 drop Both Eyes BID  . enoxaparin (LOVENOX) injection  40 mg Subcutaneous QHS  . feeding supplement (ENSURE ENLIVE)  237 mL Oral TID BM  . latanoprost  1 drop Both Eyes QHS   Continuous Infusions: . sodium chloride 100 mL/hr at 11/05/16 1904     LOS: 5 days    Time spent: 30 min    Renae FickleSHORT, Kersten Salmons, MD [facility] Pager [phone]  If 7PM-7AM, please contact night-coverage [email or web address] Password [email or web address] 11/07/2016, 2:31 PM

## 2016-11-07 NOTE — Progress Notes (Signed)
Physical Therapy Treatment Patient Details Name: Edwin AmorDonald Wright MRN: [id number] DOB: 02/25/1951 Today's Date: 11/07/2016    History of Present Illness 65 y.o. male admitted from ALF with fever, chills, back pain. Dx of flu, SIRS, dehydration, ARF. PMH of HTN and glaucoma, mild confusion at baseline.     PT Comments    The patient indicates abdominal pain. Stated"I need alka selzer but they won't give it to me. I need to burb". The patient requires frequent cues, does best with tactile cues. Plans  SNF.   Follow Up Recommendations  SNF;Supervision/Assistance - 24 hour     Equipment Recommendations  None recommended by PT    Recommendations for Other Services       Precautions / Restrictions Precautions Precautions: Fall Precaution Comments: limited vision and HOH    Mobility  Bed Mobility Overal bed mobility: Needs Assistance Bed Mobility: Rolling Rolling: Min assist   Supine to sit: Mod assist Sit to supine: Mod assist   General bed mobility comments: assist with trunk and legs to sit up and return to supine tactile cues  seemed to be more effective, han tactile cues to roll by reaching to rail.  Transfers                    Ambulation/Gait                 Stairs            Wheelchair Mobility    Modified Rankin (Stroke Patients Only)       Balance Overall balance assessment: Needs assistance Sitting-balance support: Feet supported;No upper extremity supported Sitting balance-Leahy Scale: Fair                              Cognition Arousal/Alertness: Awake/alert Behavior During Therapy: Anxious Overall Cognitive Status: No family/caregiver present to determine baseline cognitive functioning                 General Comments: pt needed VC  and tactile cues for all aspects of activity    Exercises      General Comments        Pertinent Vitals/Pain Faces Pain Scale: Hurts even more Pain Location: indicates  abdomen and nedd to belch Pain Descriptors / Indicators: Grimacing;Guarding;Cramping Pain Intervention(s): Monitored during session;Repositioned    Home Living                      Prior Function            PT Goals (current goals can now be found in the care plan section) Progress towards PT goals: Progressing toward goals    Frequency    Min 2X/week      PT Plan Frequency needs to be updated    Co-evaluation             End of Session   Activity Tolerance: Patient tolerated treatment well Patient left: in bed;with call bell/phone within reach;with bed alarm set     Time: 1455-1506 PT Time Calculation (min) (ACUTE ONLY): 11 min  Charges:  $Therapeutic Activity: 8-22 mins                    G Codes:      Edwin Wright, Edwin Wright 11/07/2016, 3:47 PM

## 2016-11-07 NOTE — Progress Notes (Signed)
Nutrition Follow-up  DOCUMENTATION CODES:   Severe malnutrition in context of chronic illness  INTERVENTION:   Recommend Nutrition Support through enteral feedings as pt has not had adequate nutrition for at least 7 days.    Recommend Osmolite 1.5 starting at 6515ml/hr and increasing by 15ml every 12 hrs until goal rate of 7845ml/hr is reached. Advance NS slowly as pt is at high risk for refeeding syndrome.  Recommend Prostat SF once daily to meet protein needs.   Recommend 224ml flushes QID to meet 301ml/kcal/kg fluid needs.    This regimen will provide 1720kcal/day (90-100% estimated needs) 83g/day protein (95-102% estimated needs) 823ml free water.     Continue to encourage supplements if no nutrition support:  Ensure Enlive po BID, each supplement provides 350 kcal and 20 grams of protein  Magic cup TID with meals, each supplement provides 290 kcal and 9 grams of protein  NUTRITION DIAGNOSIS:   Malnutrition related to poor appetite, chronic illness as evidenced by severe depletion of body fat, severe depletion of muscle mass.  GOAL:   Patient will meet greater than or equal to 90% of their needs  MONITOR:   PO intake, Supplement acceptance  REASON FOR ASSESSMENT:   Consult Assessment of nutrition requirement/status  ASSESSMENT:   65 y.o. male with medical history significant of HTN, Left wrist join tinfection, hard of hearing, glaucoma vision impairments. Presented with 2 day history of decreased by mouth intake last active shaking chills lower back pain since yesterday. Increased urinary frequency not associated nausea or vomiting question may be had a cough.  Spoke to RN today. Pt not eating anything except had a few sips of milk today. Pt has no interest in supplements. Pt has not eaten well since admit 12/16 and possibly did not eat well pta. Unsure if wt loss as pt has not been weighed since 12/17. Pt with severe malnutrition. Spoke to Dr. Malachi BondsShort about initiating  nutrition support. MD will consider after assessment of pt 12/23.   Medications reviewed and include: lovenox, Mg Sulfate, zofran   Labs reviewed: K 3.4(L), Co2 17(L), Ca 8.4(L) adj. 9.12 wnl, Alb 3.1(L)  Diet Order:  DIET DYS 3 Room service appropriate? Yes; Fluid consistency: Thin  Skin:  Reviewed, no issues  Last BM:  12/21  Height:   Ht Readings from Last 1 Encounters:  11/02/16 5\' 10"  (1.778 m)    Weight:   Wt Readings from Last 1 Encounters:  11/02/16 120 lb (54.4 kg)    Ideal Body Weight:  75 kg  BMI:  Body mass index is 17.22 kg/m.  Estimated Nutritional Needs:   Kcal:  1700-1900kcal/day   Protein:  81-87g/day   Fluid:  2L/day   EDUCATION NEEDS:   No education needs identified at this time  Betsey Holidayasey Dalores Weger, RD, LDN Pager #[phone]- [phone]

## 2016-11-08 LAB — BASIC METABOLIC PANEL
Anion gap: 10 (ref 5–15)
BUN: 8 mg/dL (ref 6–20)
CALCIUM: 8.3 mg/dL — AB (ref 8.9–10.3)
CO2: 14 mmol/L — AB (ref 22–32)
CREATININE: 0.81 mg/dL (ref 0.61–1.24)
Chloride: 115 mmol/L — ABNORMAL HIGH (ref 101–111)
GFR calc Af Amer: >60 mL/min (ref >60)
GFR calc non Af Amer: >60 mL/min (ref >60)
GLUCOSE: 72 mg/dL (ref 65–99)
Potassium: 3.8 mmol/L (ref 3.5–5.1)
Sodium: 139 mmol/L (ref 135–145)

## 2016-11-08 LAB — CBC
HCT: 30.8 % — ABNORMAL LOW (ref 39.0–52.0)
HEMOGLOBIN: 9.9 g/dL — AB (ref 13.0–17.0)
MCH: 30.7 pg (ref 26.0–34.0)
MCHC: 32.1 g/dL (ref 30.0–36.0)
MCV: 95.7 fL (ref 78.0–100.0)
PLATELETS: 338 10*3/uL (ref 150–400)
RBC: 3.22 MIL/uL — ABNORMAL LOW (ref 4.22–5.81)
RDW: 13 % (ref 11.5–15.5)
WBC: 8.2 10*3/uL (ref 4.0–10.5)

## 2016-11-08 LAB — PSA: PSA: 1.32 ng/mL (ref 0.00–4.00)

## 2016-11-08 LAB — SEDIMENTATION RATE: SED RATE: 83 mm/h — AB (ref 0–16)

## 2016-11-08 LAB — FERRITIN: Ferritin: 577 ng/mL — ABNORMAL HIGH (ref 24–336)

## 2016-11-08 MED ORDER — KCL IN DEXTROSE-NACL 20-5-0.45 MEQ/L-%-% IV SOLN
INTRAVENOUS | Status: DC
  Administered 2016-11-08 – 2016-11-11 (×5): via INTRAVENOUS
  Filled 2016-11-08 (×8): qty 1000

## 2016-11-08 MED ORDER — FAMOTIDINE 20 MG PO TABS
20.0000 mg | ORAL_TABLET | Freq: Two times a day (BID) | ORAL | Status: DC
  Administered 2016-11-08 – 2016-11-11 (×6): 20 mg via ORAL
  Filled 2016-11-08 (×7): qty 1

## 2016-11-08 MED ORDER — MIRTAZAPINE 15 MG PO TABS
7.5000 mg | ORAL_TABLET | Freq: Every day | ORAL | Status: DC
  Administered 2016-11-09 – 2016-11-10 (×2): 7.5 mg via ORAL
  Filled 2016-11-08 (×3): qty 1

## 2016-11-08 NOTE — Progress Notes (Signed)
Attempted to feed patient.  Patient took one bite of chocolate pudding, one sip of tea  Reports "I know I need to eat but I can't."  Offered patient other foods.  Patient refused.

## 2016-11-08 NOTE — Progress Notes (Signed)
PROGRESS NOTE  Edwin Wright  LKG:[id number] DOB: Sep 11, 1951 DOA: 11/02/2016 PCP: [facility]  Brief Narrative:   The patient is a 65 year old male with history of hypertension, hearing and visual deficits, who presented with poor oral intake, chills, and low back pain for 2 days prior to admission. Initially denied fever, cough, shortness of breath, nausea and vomiting but complained of abdominal discomfort. In the emergency department, he was found to have a fever of 102.25F, heart rate in the low 100s and tachypnea. Although his chest x-ray and urinalysis were negative for signs of infection, he was flu PCR positive. CT of the abdomen and pelvis demonstrated gaseous distention, but no overt colitis. He was started on Tamiflu and IV fluids. He has had ongoing diarrhea and minimal oral intake secondary to nausea.  Assessment & Plan:   Active Problems:   Hypertension   SIRS (systemic inflammatory response syndrome) (HCC)   Dehydration   Glaucoma   ARF (acute renal failure) (HCC)   Anemia   Protein-calorie malnutrition, severe   Acute kidney injury ([place])   Iron deficiency anemia   Hypokalemia   Hypomagnesemia  SIRS criteria positive secondary to influenza a, fevers resolved.  - Cultures no growth to date -  Continue Tamiflu, day 5 -  Continue IV fluids  Persistent abdominal discomfort with nausea and diarrhea, evaluation for alternative causes of diarrhea.   -  GI pathogen panel -  Continue IV fluids -  Continue scheduled Zofran and when necessary Phenergan -  No pathology evidence on CT ab/pelvis this admission  Severe protein calorie malnutrition, still not eating.  Discussed with sister who stated he had been gaining weight initially at the SNF initially but more recently had been losing weight.  Thinks it may be partly due to depression, but he may have an undetected malignancy or mesenteric ischemia contributing to his decline over the last few months.  No  evidence of cancer on his CT a/p and there was no comment on SMA/IMA disease.  I personally reviewed the CT images and the SMA appears widely patent despite an area of aortic calcification right at the bifurcation of the artery.  Ferritin was very elevated, but this was in the setting of acute influenza.   -  Discussed possibility of feeding tube -  Palliative care consult -  Nutrition assistance appreciated -  Start mirtazapine 7.57m qhs  Hyperferritinemia, likely due to influenza -  Repeat ferritin -  Check ESR -  Screen for cancer:  No evidence of colon cancer on CT -  PSA -  Check hepatitis panel for A, B, and C  Acute kidney injury, baseline creatinine of 0.7-0.8. Creatinine trended down from 1.44 to normal with IV fluids  Hypokalemia/hypomagnesemia due to diarrhea and poor oral intake, resolved with supplementation  Mild elevation of AST that resolved with IVF, likely due to dehydration.    Iron deficiency anemia, hgb trended down slightly, likely due to dilution from IVF -Recommend iron supplement prior to discharge  ? Hyperthyroidism -Suppressed TSH and mildly elevated free T4.  -recommendation for repeat TSH in 3-4 weeks  Glaucoma with visual impairment. - Remains stable currently  DVT prophylaxis:  Lovenox Code Status:  Full code Family Communication:  Patient.  Spoke with his sister by phone Disposition Plan:  Likely to SNF, but really not eating and drinking.  If family considering PEG placement, will arrange for this to be done in hospital.  If family elects comfort, would be a candidate for  hospice given his current condition.  Palliative care consult placed and will likely meet with family Sunday.     Consultants:   None  Procedures:  None  Antimicrobials:  Anti-infectives    Start     Dose/Rate Route Frequency Ordered Stop   11/06/16 1000  oseltamivir (TAMIFLU) capsule 75 mg     75 mg Oral 2 times daily 11/06/16 0912 11/07/16 0813   11/03/16 0315   oseltamivir (TAMIFLU) capsule 30 mg  Status:  Discontinued     30 mg Oral 2 times daily 11/03/16 0306 11/06/16 0912       Subjective:  Did not respond to my questions this morning.  Somewhat somnolent but did open eyes.    Objective: Vitals:   11/07/16 0513 11/07/16 1418 11/07/16 2218 11/08/16 0403  BP: (!) 121/47 (!) 125/59 120/64 137/71  Pulse: 78 80 68 73  Resp: 19 20 19 19  Temp: 98.2 F (36.8 C) 98.7 F (37.1 C) 98.5 F (36.9 C) 98.5 F (36.9 C)  TempSrc: Oral Oral Oral Oral  SpO2: 97% 95% 99% 97%  Weight:      Height:        Intake/Output Summary (Last 24 hours) at 11/08/16 1441 Last data filed at 11/08/16 0800  Gross per 24 hour  Intake           871.67 ml  Output              95 0 ml  Net           -78.33 ml   Filed Weights   11/02/16 1504  Weight: 54.4 kg (120 lb)    Examination:  General exam: Thin adult male.  No acute distress.  HEENT:  NCAT, MMM Respiratory system: Clear to auscultation bilaterally Cardiovascular system: Regular rate and rhythm, normal S1/S2. No murmurs, rubs, gallops or clicks.  Warm extremities Gastrointestinal system: normal bowel sounds, soft, nondistended, mildly TTP diffusely without rebound or guarding. MSK:  Normal tone and bulk, no lower extremity edema Neuro:  Grossly intact    Data Reviewed: I have personally reviewed following labs and imaging studies  CBC:  Recent Labs Lab 11/02/16 1511 11/03/16 0524 11/08/16 0540  WBC 9.6 6.7 8.2  NEUTROABS 7.3  --   --   HGB 12.5* 11.2* 9.9*  HCT 36.8* 34.1* 30.8*  MCV 91.8 93.4 95.7  PLT 296 292 233   Basic Metabolic Panel:  Recent Labs Lab 11/03/16 0524 11/04/16 0508 11/06/16 0548 11/07/16 0503 11/08/16 0540  NA 135 136 143 137 139  K 2.8* 3.4* 3.8 3.4* 3.8  CL 103 108 113* 111 115*  CO2 19* 19* 20* 17* 14*  GLUCOSE 81 102* 95 78 72  BUN 25* 19 10 9 8   CREATININE 1.27* 1.11 0.82 0.73 0.81  CALCIUM 8.5* 8.6* 9.0 8.4* 8.3*  MG 1.8  --   --  1.7  --   PHOS  3.1  --   --   --   --    GFR: Estimated Creatinine Clearance: 68.1 mL/min (by C-G formula based on SCr of 0.81 mg/dL). Liver Function Tests:  Recent Labs Lab 11/02/16 1511 11/03/16 0524 11/07/16 0503  AST 49* 56* 38  ALT 16* 16* 17  ALKPHOS 70 59 55  BILITOT 1.2 1.4* 0.9  PROT 8.0 6.7 6.2*  ALBUMIN 4.0 3.5 3.1*    Recent Labs Lab 11/02/16 1511  LIPASE 33   No results for input(s): AMMONIA in the last 168 hours. Coagulation Profile: No  results for input(s): INR, PROTIME in the last 168 hours. Cardiac Enzymes: No results for input(s): CKTOTAL, CKMB, CKMBINDEX, TROPONINI in the last 168 hours. BNP (last 3 results) No results for input(s): PROBNP in the last 8760 hours. HbA1C: No results for input(s): HGBA1C in the last 72 hours. CBG:  Recent Labs Lab 11/02/16 1544  GLUCAP 98   Lipid Profile: No results for input(s): CHOL, HDL, LDLCALC, TRIG, CHOLHDL, LDLDIRECT in the last 72 hours. Thyroid Function Tests: No results for input(s): TSH, T4TOTAL, FREET4, T3FREE, THYROIDAB in the last 72 hours. Anemia Panel: No results for input(s): VITAMINB12, FOLATE, FERRITIN, TIBC, IRON, RETICCTPCT in the last 72 hours. Urine analysis:    Component Value Date/Time   COLORURINE YELLOW 11/02/2016 1702   APPEARANCEUR HAZY (A) 11/02/2016 1702   LABSPEC 1.018 11/02/2016 1702   PHURINE 5.0 11/02/2016 1702   GLUCOSEU NEGATIVE 11/02/2016 1702   HGBUR MODERATE (A) 11/02/2016 1702   BILIRUBINUR NEGATIVE 11/02/2016 1702   KETONESUR 5 (A) 11/02/2016 1702   PROTEINUR 30 (A) 11/02/2016 1702   NITRITE NEGATIVE 11/02/2016 1702   LEUKOCYTESUR NEGATIVE 11/02/2016 1702   Sepsis Labs: @LABRCNTIP (procalcitonin:4,lacticidven:4)  ) Recent Results (from the past 240 hour(s))  Culture, blood (Routine X 2) w Reflex to ID Panel     Status: None   Collection Time: 11/02/16  3:11 PM  Result Value Ref Range Status   Specimen Description BLOOD BLOOD RIGHT FOREARM  Final   Special Requests BOTTLES  DRAWN AEROBIC AND ANAEROBIC 5 CC EACH  Final   Culture   Final    NO GROWTH 5 DAYS Performed at [facility]    Report Status 11/07/2016 FINAL  Final  Urine culture     Status: None   Collection Time: 11/02/16  5:02 PM  Result Value Ref Range Status   Specimen Description URINE, CATHETERIZED  Final   Special Requests NONE  Final   Culture NO GROWTH Performed at [facility]   Final   Report Status 11/03/2016 FINAL  Final  Culture, blood (Routine X 2) w Reflex to ID Panel     Status: None   Collection Time: 11/02/16  5:05 PM  Result Value Ref Range Status   Specimen Description BLOOD LEFT ANTECUBITAL  Final   Special Requests BOTTLES DRAWN AEROBIC AND ANAEROBIC 10CC  Final   Culture   Final    NO GROWTH 5 DAYS Performed at [facility]    Report Status 11/07/2016 FINAL  Final  MRSA PCR Screening     Status: None   Collection Time: 11/02/16 11:10 PM  Result Value Ref Range Status   MRSA by PCR NEGATIVE NEGATIVE Final    Comment:        The GeneXpert MRSA Assay (FDA approved for NASAL specimens only), is one component of a comprehensive MRSA colonization surveillance program. It is not intended to diagnose MRSA infection nor to guide or monitor treatment for MRSA infections.       Radiology Studies: No results found.   Scheduled Meds: . brimonidine  1 drop Left Eye TID  . dorzolamide  1 drop Both Eyes BID  . enoxaparin (LOVENOX) injection  40 mg Subcutaneous QHS  . famotidine  20 mg Oral BID  . feeding supplement (ENSURE ENLIVE)  237 mL Oral TID BM  . latanoprost  1 drop Both Eyes QHS  . mirtazapine  7.5 mg Oral QHS  . ondansetron (ZOFRAN) IV  8 mg Intravenous Q8H   Continuous Infusions: . dextrose  5 % and 0.45 % NaCl with KCl 20 mEq/L 100 mL/hr at 11/08/16 1346     LOS: 6 days    Time spent: 30 min    Janece Canterbury, MD [facility] Pager [phone]  If 7PM-7AM, please contact night-coverage [email or web address] Password  [facility] 11/08/2016, 2:41 PM

## 2016-11-09 DIAGNOSIS — Z8249 Family history of ischemic heart disease and other diseases of the circulatory system: Secondary | ICD-10-CM

## 2016-11-09 DIAGNOSIS — E876 Hypokalemia: Secondary | ICD-10-CM

## 2016-11-09 DIAGNOSIS — F17210 Nicotine dependence, cigarettes, uncomplicated: Secondary | ICD-10-CM

## 2016-11-09 DIAGNOSIS — D509 Iron deficiency anemia, unspecified: Secondary | ICD-10-CM

## 2016-11-09 DIAGNOSIS — Z803 Family history of malignant neoplasm of breast: Secondary | ICD-10-CM

## 2016-11-09 DIAGNOSIS — F4321 Adjustment disorder with depressed mood: Secondary | ICD-10-CM

## 2016-11-09 DIAGNOSIS — Z79899 Other long term (current) drug therapy: Secondary | ICD-10-CM

## 2016-11-09 DIAGNOSIS — N179 Acute kidney failure, unspecified: Secondary | ICD-10-CM

## 2016-11-09 DIAGNOSIS — Z833 Family history of diabetes mellitus: Secondary | ICD-10-CM

## 2016-11-09 DIAGNOSIS — E43 Unspecified severe protein-calorie malnutrition: Secondary | ICD-10-CM

## 2016-11-09 LAB — BASIC METABOLIC PANEL
ANION GAP: 6 (ref 5–15)
BUN: 6 mg/dL (ref 6–20)
CALCIUM: 8.4 mg/dL — AB (ref 8.9–10.3)
CO2: 20 mmol/L — AB (ref 22–32)
Chloride: 112 mmol/L — ABNORMAL HIGH (ref 101–111)
Creatinine, Ser: 0.75 mg/dL (ref 0.61–1.24)
GLUCOSE: 122 mg/dL — AB (ref 65–99)
Potassium: 3.7 mmol/L (ref 3.5–5.1)
Sodium: 138 mmol/L (ref 135–145)

## 2016-11-09 LAB — CBC
HEMATOCRIT: 29.8 % — AB (ref 39.0–52.0)
Hemoglobin: 9.8 g/dL — ABNORMAL LOW (ref 13.0–17.0)
MCH: 30.5 pg (ref 26.0–34.0)
MCHC: 32.9 g/dL (ref 30.0–36.0)
MCV: 92.8 fL (ref 78.0–100.0)
Platelets: 366 10*3/uL (ref 150–400)
RBC: 3.21 MIL/uL — AB (ref 4.22–5.81)
RDW: 12.8 % (ref 11.5–15.5)
WBC: 7.5 10*3/uL (ref 4.0–10.5)

## 2016-11-09 LAB — HEPATITIS B SURFACE ANTIGEN: HEP B S AG: NEGATIVE

## 2016-11-09 LAB — HCV COMMENT:

## 2016-11-09 LAB — HEPATITIS B CORE ANTIBODY, IGM: Hep B C IgM: NEGATIVE

## 2016-11-09 LAB — HEPATITIS A ANTIBODY, IGM: HEP A IGM: NEGATIVE

## 2016-11-09 LAB — HEPATITIS C ANTIBODY (REFLEX): HCV Ab: <0.1 s/co ratio (ref 0.0–0.9)

## 2016-11-09 LAB — HEPATITIS B SURFACE ANTIBODY,QUALITATIVE: Hep B S Ab: NONREACTIVE

## 2016-11-09 MED ORDER — METHYLPHENIDATE HCL 5 MG PO TABS
2.5000 mg | ORAL_TABLET | Freq: Every day | ORAL | Status: DC
  Administered 2016-11-10 – 2016-11-11 (×2): 2.5 mg via ORAL
  Filled 2016-11-09 (×2): qty 1

## 2016-11-09 NOTE — Consult Note (Signed)
[facility] Consult   Reason for Consult:  Refusal to eat and take his medication  Referring Physician:  Dr. Janece Canterbury Patient Identification: Edwin Wright MRN:  [id number] Principal Diagnosis: <principal problem not specified> Diagnosis:   Patient Active Problem List   Diagnosis Date Noted  . Protein-calorie malnutrition, severe [E43] 11/03/2016  . Acute kidney injury ([place]) [N17.9]   . Iron deficiency anemia [D50.9]   . Hypokalemia [E87.6]   . Hypomagnesemia [E83.42]   . Hypertension [I10] 11/02/2016  . SIRS (systemic inflammatory response syndrome) ([facility]) [R65.10] 11/02/2016  . Dehydration [E86.0] 11/02/2016  . Glaucoma [H40.9] 11/02/2016  . ARF (acute renal failure) ([place]) [N17.9] 11/02/2016  . Anemia [D64.9] 11/02/2016  . Joint infection of left wrist ([place]) [M00.9] 07/20/2014    Total Time spent with patient: 45 minutes  Subjective/HPI:   Edwin Wright is a 65 y.o. male patient with no known psychiatry history, hypertension, hearing and visual deficits, who presented with poor oral intake, chills, and low back pain for 2 days prior to admission. Patient was treated for influenza. Psychiatry is consulted for refusal to eat and take medication.  - Per H&P on 12/17, "Family denies history of dementia states that patient understands questions but simply refuses or unable to answer this has been a progressive change her past months." - per nursing report, patient refuses to talk or answer questions, although he does talk some on other occasion. His affect "perked up" when his family visited him yesterday. No concern for agitation.   Patient is interviewed at the bedside.  He states that he is not sure why he cannot eat. He reports he does not taste anything. He believes that it started to happen after he went to ALF. He reports that he went there as he was recommended by his doctor due to his glaucoma. He does not like there, as they have "certain schedule" and he  feels loss of control. He does not have family member to talk with, although he reports he has a sister. He feels sad and depressed, although he is unable to elaborate it. He agrees to take mirtazapine.   He denies insomnia. He reports anhedonia, and states that he has not been able to enjoy playing cards or pool as he used to due to his physical condition. He denies SI, AH/VH. He is a [place] combat veteran and has "thoughts" about his combat; denies any nightmares, hypervigilance. He used to drink 12 beer per week years ago, denies current alcohol use. He denies drug use.   Past Psychiatric History:  Outpatient: denies Psychiatry admission: denies  Previous suicide attempt: denies Past trials of medication:  denies History of violence: denies after [place] to Self: Is patient at risk for suicide?: No Risk to Others:   Prior Inpatient Therapy:   Prior Outpatient Therapy:    Past Medical History:  Past Medical History:  Diagnosis Date  . Glaucoma   . HOH (hard of hearing)   . Hypertension     Past Surgical History:  Procedure Laterality Date  . ARTHROTOMY Left 07/21/2014   Procedure: ARTHROTOMY; IRRIGATION AND DRAINAGE OF LEFT WRIST INFECTION;  Surgeon: Marybelle Killings, MD;  Location: [facility];  Service: Orthopedics;  Laterality: Left;   Family History:  Family History  Problem Relation Age of Onset  . Breast cancer Sister   . Cancer Sister   . Diabetes Sister   . Hypertension Sister   . Diabetes Brother   . CAD Neg Hx   .  Stroke Neg Hx    Family Psychiatric  History: denies Social History:  History  Alcohol Use No     History  Drug Use No    Social History   Social History  . Marital status: Married    Spouse name: N/A  . Number of children: N/A  . Years of education: N/A   Social History Main Topics  . Smoking status: Current Every Day Smoker    Types: Cigarettes  . Smokeless tobacco: Never Used  . Alcohol use No  . Drug use: No  . Sexual activity: No    Other Topics Concern  . None   Social History Narrative  . None   Additional Social History:   Used to be in [employer], combat veteran in [place] Separated for years, has one daughter but he has not contacted with her for a while  Allergies:  No Known Allergies  Labs:  Results for orders placed or performed during the hospital encounter of 11/02/16 (from the past 48 hour(s))  Basic metabolic panel     Status: Abnormal   Collection Time: 11/08/16  5:40 AM  Result Value Ref Range   Sodium 139 135 - 145 mmol/L   Potassium 3.8 3.5 - 5.1 mmol/L   Chloride 115 (H) 101 - 111 mmol/L   CO2 14 (L) 22 - 32 mmol/L   Glucose, Bld 72 65 - 99 mg/dL   BUN 8 6 - 20 mg/dL   Creatinine, Ser 0.81 0.61 - 1.24 mg/dL   Calcium 8.3 (L) 8.9 - 10.3 mg/dL   GFR calc non Af Amer >60 >60 mL/min   GFR calc Af Amer >60 >60 mL/min    Comment: (NOTE) The eGFR has been calculated using the CKD EPI equation. This calculation has not been validated in all clinical situations. eGFR's persistently <60 mL/min signify possible Chronic Kidney Disease.    Anion gap 10 5 - 15  CBC     Status: Abnormal   Collection Time: 11/08/16  5:40 AM  Result Value Ref Range   WBC 8.2 4.0 - 10.5 K/uL   RBC 3.22 (L) 4.22 - 5.81 MIL/uL   Hemoglobin 9.9 (L) 13.0 - 17.0 g/dL   HCT 30.8 (L) 39.0 - 52.0 %   MCV 95.7 78.0 - 100.0 fL   MCH 30.7 26.0 - 34.0 pg   MCHC 32.1 30.0 - 36.0 g/dL   RDW 13.0 11.5 - 15.5 %   Platelets 338 150 - 400 K/uL  Sedimentation rate     Status: Abnormal   Collection Time: 11/08/16  3:01 PM  Result Value Ref Range   Sed Rate 83 (H) 0 - 16 mm/hr  PSA     Status: None   Collection Time: 11/08/16  3:01 PM  Result Value Ref Range   PSA 1.32 0.00 - 4.00 ng/mL    Comment: (NOTE) While PSA levels of <=4.0 ng/ml are reported as reference range, some men with levels below 4.0 ng/ml can have prostate cancer and many men with PSA above 4.0 ng/ml do not have prostate cancer.  Other tests such as free PSA,  age specific reference ranges, PSA velocity and PSA doubling time may be helpful especially in men less than 14 years old. Performed at [facility]   Ferritin     Status: Abnormal   Collection Time: 11/08/16  3:01 PM  Result Value Ref Range   Ferritin 577 (H) 24 - 336 ng/mL    Comment: Performed at [facility]  Hepatitis A antibody, IgM     Status: None   Collection Time: 11/08/16  3:01 PM  Result Value Ref Range   Hep A IgM Negative Negative    Comment: (NOTE) Performed At: [facility] [address] [place], [place] [id number] Lindon Romp MD [id number]   Hepatitis B core antibody, IgM     Status: None   Collection Time: 11/08/16  3:01 PM  Result Value Ref Range   Hep B C IgM Negative Negative    Comment: (NOTE) Performed At: [facility] [place], [place] [id number] Lindon Romp MD [id number]   Hepatitis B surface antibody     Status: None   Collection Time: 11/08/16  3:01 PM  Result Value Ref Range   Hep B S Ab Non Reactive     Comment: (NOTE)              Non Reactive: Inconsistent with immunity,                            less than 10 mIU/mL              Reactive:     Consistent with immunity,                            greater than 9.9 mIU/mL Performed At: [facility] [place], [place] [id number] Lindon Romp MD [id number]   Hepatitis B surface antigen     Status: None   Collection Time: 11/08/16  3:01 PM  Result Value Ref Range   Hepatitis B Surface Ag Negative Negative    Comment: (NOTE) Performed At: [facility] [place], [place] [id number] Lindon Romp MD [id number]   Hepatitis c antibody (reflex)     Status: None   Collection Time: 11/08/16  3:01 PM  Result Value Ref Range   HCV Ab <0.1 0.0 - 0.9 s/co ratio    Comment: (NOTE) Performed At: [facility] [place], [place] [id number] Lindon Romp MD  [id number]   HCV Comment:     Status: None   Collection Time: 11/08/16  3:01 PM  Result Value Ref Range   Comment: Comment     Comment: (NOTE) Non reactive HCV antibody screen is consistent with no HCV infection, unless recent infection is suspected or other evidence exists to indicate HCV infection. Performed At: [facility] [place], [place] [id number] Lindon Romp MD [id number]   Basic metabolic panel     Status: Abnormal   Collection Time: 11/09/16  5:16 AM  Result Value Ref Range   Sodium 138 135 - 145 mmol/L   Potassium 3.7 3.5 - 5.1 mmol/L   Chloride 112 (H) 101 - 111 mmol/L   CO2 20 (L) 22 - 32 mmol/L   Glucose, Bld 122 (H) 65 - 99 mg/dL   BUN 6 6 - 20 mg/dL   Creatinine, Ser 0.75 0.61 - 1.24 mg/dL   Calcium 8.4 (L) 8.9 - 10.3 mg/dL   GFR calc non Af Amer >60 >60 mL/min   GFR calc Af Amer >60 >60 mL/min    Comment: (NOTE) The eGFR has been calculated using the CKD EPI equation. This calculation has not been validated in all clinical situations. eGFR's persistently <60 mL/min signify possible Chronic Kidney Disease.    Anion gap 6 5 -  15  CBC     Status: Abnormal   Collection Time: 11/09/16  5:16 AM  Result Value Ref Range   WBC 7.5 4.0 - 10.5 K/uL   RBC 3.21 (L) 4.22 - 5.81 MIL/uL   Hemoglobin 9.8 (L) 13.0 - 17.0 g/dL   HCT 29.8 (L) 39.0 - 52.0 %   MCV 92.8 78.0 - 100.0 fL   MCH 30.5 26.0 - 34.0 pg   MCHC 32.9 30.0 - 36.0 g/dL   RDW 12.8 11.5 - 15.5 %   Platelets 366 150 - 400 K/uL    Current Facility-Administered Medications  Medication Dose Route Frequency Provider Last Rate Last Dose  . acetaminophen (TYLENOL) tablet 650 mg  650 mg Oral Q6H PRN Toy Baker, MD   650 mg at 11/03/16 1209   Or  . acetaminophen (TYLENOL) suppository 650 mg  650 mg Rectal Q6H PRN Toy Baker, MD      . brimonidine (ALPHAGAN) 0.2 % ophthalmic solution 1 drop  1 drop Left Eye TID Toy Baker, MD   1 drop at 11/09/16 1046   . chlorproMAZINE (THORAZINE) tablet 10 mg  10 mg Oral QID PRN Janece Canterbury, MD      . dextrose 5 % and 0.45 % NaCl with KCl 20 mEq/L infusion   Intravenous Continuous Janece Canterbury, MD 100 mL/hr at 11/09/16 1056    . dorzolamide (TRUSOPT) 2 % ophthalmic solution 1 drop  1 drop Both Eyes BID Toy Baker, MD   1 drop at 11/09/16 1046  . enoxaparin (LOVENOX) injection 40 mg  40 mg Subcutaneous QHS Nishant Dhungel, MD   40 mg at 11/08/16 2200  . famotidine (PEPCID) tablet 20 mg  20 mg Oral BID Janece Canterbury, MD   20 mg at 11/09/16 1046  . feeding supplement (ENSURE ENLIVE) (ENSURE ENLIVE) liquid 237 mL  237 mL Oral TID BM Nishant Dhungel, MD   237 mL at 11/09/16 1046  . HYDROcodone-acetaminophen (NORCO/VICODIN) 5-325 MG per tablet 1-2 tablet  1-2 tablet Oral Q4H PRN Toy Baker, MD      . latanoprost (XALATAN) 0.005 % ophthalmic solution 1 drop  1 drop Both Eyes QHS Toy Baker, MD   1 drop at 11/08/16 2103  . mirtazapine (REMERON) tablet 7.5 mg  7.5 mg Oral QHS Janece Canterbury, MD      . ondansetron [facility]) 8 mg in sodium chloride 0.9 % 50 mL IVPB  8 mg Intravenous Q8H Janece Canterbury, MD   8 mg at 11/09/16 1239  . promethazine (PHENERGAN) injection 12.5 mg  12.5 mg Intravenous Q6H PRN Janece Canterbury, MD        Musculoskeletal: Strength & Muscle Tone: decreased Gait & Station: unable to assess as pt lying in the bed Patient leans: Backward  Psychiatric Specialty Exam: Physical Exam  Review of Systems  Psychiatric/Behavioral: Positive for depression. Negative for hallucinations, substance abuse and suicidal ideas. The patient is not nervous/anxious and does not have insomnia.   All other systems reviewed and are negative.   Blood pressure 101/60, pulse 62, temperature 98.8 F (37.1 C), temperature source Oral, resp. rate 18, height 5' 10"  (1.778 m), weight 120 lb (54.4 kg), SpO2 98 %.Body mass index is 17.22 kg/m.  General Appearance: Fairly Groomed  Eye  Contact:  Fair  Speech:  Slow  Volume:  Decreased  Mood:  Depressed  Affect:  Restricted- improved later in the interview  Thought Process:  Coherent and Goal Directed  Orientation:  Full (Time, Place, and Person)  Thought  Content:  Logical Perceptions: denies AH/VH  Suicidal Thoughts:  No  Homicidal Thoughts:  No  Memory:  Immediate;   Fair Recent;   Fair Remote;   Fair  Judgement:  Good  Insight:  Fair  Psychomotor Activity:  Decreased  Concentration:  Concentration: Good and Attention Span: Good  Recall:  [place] of Knowledge:  Good  Language:  Good  Akathisia:  No  Handed:  Right  AIMS (if indicated):     Assets:  Communication Skills Desire for Improvement  ADL's:  Intact  Cognition:  WNL  Sleep:   good   Assessment Edwin Wright is a 65 y.o. male patient with no known psychiatry history, hypertension, hearing and visual deficits, who presented with poor oral intake, chills, and low back pain for 2 days prior to admission. Patient was treated for influenza. Psychiatry is consulted for refusal to eat and take medication.   # Adjustment disorder with depressed mood # r/o MDD Today's exam is notable for his improved reactive affect after having good communication with hearing aids; patient elaborates his frustration in regards to his "loss of control" due to his current physical state and living situation prior to this admission. Although he is certainly demoralized by these condition, it is difficult to discern how much it plays role into his appetite loss. Regardless, he would benefit from uptitration of mirtazapine to target both his mood/appetite. Would advise taking time for patient to elaborate his opinion at each visit, which would help decreasing patient's sense of loss of control and isolation. No significant symptoms consistent with psychotic disorder or dementia on today's evaluation.   Recommendation - Continue mirtazapine 7.5 mg at night for 3 days. Consider  increase to 15 mg at night for one week, then 30 mg at night. Hold uptitration if patient has adverse events of drowsiness in the morning.  - Would recommend taking time for patient to elaborate his opinion at each visit. Regular family visit would also help to decrease his sense of isolation.   Treatment Plan Summary: Plan as above  Disposition: Patient does not meet criteria for psychiatric inpatient admission.   The patient demonstrates the following risk factors for suicide: Chronic risk factors for suicide include: N/A. Acute risk factors for suicide include: unemployment, social withdrawal/isolation and loss (financial, interpersonal, professional). Protective factors for this patient include: positive social support, coping skills and hope for the future. Considering these factors, the overall suicide risk at this point appears to be low.   Thank you for your consult. We will sign off. Please contact psychiatry consult if you have any questions or concerns.   Norman Clay, MD 11/09/2016 2:36 PM

## 2016-11-09 NOTE — Consult Note (Signed)
Consultation Note Date: 11/09/2016   Patient Name: Edwin Wright  DOB: November 24, 1950  MRN: [id number]  Age / Sex: 65 y.o., male  PCP: [facility] Referring Physician: Renae Fickle, MD  Reason for Consultation: Establishing goals of care  HPI/Patient Profile: 65 y.o. male  with past medical history of HTN, hearing and visual deficits, poor oral intake  admitted on 11/02/2016 with flu, severe protein calorie malnutrition, acute kidney injury.   Clinical Assessment and Goals of Care:  A palliative consult has been requested for goals of care discussions, discussions pertaining to artificial nutrition and hydration.   [name] is resting in bed, he is awake alert. He is eating breakfast. The patient does not engage much, does not verbalize much even when spoken to loudly. Hence, call placed and discussed with sister Nicole Cella copeland in detail.   The patient was living in an assisted living facility, Ms Dallas Schimke has requested that he be sent to [facility] health care on discharge for rehab. I introduced myself and palliative care as follows: Palliative medicine is specialized medical care for people living with serious illness. It focuses on providing relief from the symptoms and stress of a serious illness. The goal is to improve quality of life for both the patient and the family.  Discussed with Ms Dallas Schimke about the patient's current condition, his current hospitalization, the medications being given. Goals and wishes discussed, see recommendations below, thank you for the consult.   HCPOA  sister Sharren Bridge [phone]  SUMMARY OF RECOMMENDATIONS    1. Sister requests for continued oral feeding, ongoing efforts to engage with patient, assist with feeding etc. If still not eating enough, she is agreeable to PEG placement.  2. Agree with Remeron, could also increase to 15 mg PO Q HS. Agree  with onc work up, PSA is not significant, patient's serum Albumin is 3.1 to 3.5, not terribly low.  3. Patient how ever, is not eating, he is not even taking his medications, RN informs me that he refused his prilosec and remeron last night.  4. Recommend psych consult for possible severe depression, patient is with flat affect, mask life facies, not engaging, not eating, not taking medications. ? Role here for in patient psych.   We will continue to follow along, monitor patient, continue conversations with sister. Thank you for the consult.   Code Status/Advance Care Planning:  Full code    Symptom Management:    as above   Palliative Prophylaxis:   Bowel Regimen  Additional Recommendations (Limitations, Scope, Preferences):  Full Scope Treatment  Psycho-social/Spiritual:   Desire for further Chaplaincy support:no  Additional Recommendations: Caregiving  Support/Resources  Prognosis:   Unable to determine  Discharge Planning: To Be Determined      Primary Diagnoses: Present on Admission: . Hypertension . SIRS (systemic inflammatory response syndrome) (HCC) . Dehydration . Glaucoma . ARF (acute renal failure) ([facility]CC) . Anemia   I have reviewed the medical record, interviewed the patient and family, and examined the patient. The following  aspects are pertinent.  Past Medical History:  Diagnosis Date  . Glaucoma   . HOH (hard of hearing)   . Hypertension    Social History   Social History  . Marital status: Married    Spouse name: N/A  . Number of children: N/A  . Years of education: N/A   Social History Main Topics  . Smoking status: Current Every Day Smoker    Types: Cigarettes  . Smokeless tobacco: Never Used  . Alcohol use No  . Drug use: No  . Sexual activity: No   Other Topics Concern  . None   Social History Narrative  . None   Family History  Problem Relation Age of Onset  . Breast cancer Sister   . Cancer Sister   . Diabetes  Sister   . Hypertension Sister   . Diabetes Brother   . CAD Neg Hx   . Stroke Neg Hx    Scheduled Meds: . brimonidine  1 drop Left Eye TID  . dorzolamide  1 drop Both Eyes BID  . enoxaparin (LOVENOX) injection  40 mg Subcutaneous QHS  . famotidine  20 mg Oral BID  . feeding supplement (ENSURE ENLIVE)  237 mL Oral TID BM  . latanoprost  1 drop Both Eyes QHS  . mirtazapine  7.5 mg Oral QHS  . ondansetron (ZOFRAN) IV  8 mg Intravenous Q8H   Continuous Infusions: . dextrose 5 % and 0.45 % NaCl with KCl 20 mEq/L 100 mL/hr at 11/09/16 1056   PRN Meds:.acetaminophen **OR** acetaminophen, chlorproMAZINE, HYDROcodone-acetaminophen, promethazine Medications Prior to Admission:  Prior to Admission medications   Medication Sig Start Date End Date Taking? Authorizing Provider  amLODipine (NORVASC) 10 MG tablet Take 10 mg by mouth daily.   Yes Historical Provider, MD  brimonidine (ALPHAGAN) 0.2 % ophthalmic solution Place 1 drop into the left eye 3 (three) times daily.   Yes Historical Provider, MD  dorzolamide (TRUSOPT) 2 % ophthalmic solution Place 1 drop into both eyes 2 (two) times daily.   Yes Historical Provider, MD  hydrochlorothiazide (HYDRODIURIL) 25 MG tablet Take 25 mg by mouth daily.   Yes Historical Provider, MD  latanoprost (XALATAN) 0.005 % ophthalmic solution Place 1 drop into both eyes at bedtime.   Yes Historical Provider, MD   No Known Allergies Review of Systems + for not eating.   Physical Exam Weak elderly gentleman Hard of hearing, not able to see Clear anteriorly Regular Abdomen soft No edema  Vital Signs: BP (!) 119/56 (BP Location: Left Arm)   Pulse 79   Temp 98.3 F (36.8 C) (Oral)   Resp 18   Ht 5\' 10"  (1.778 m)   Wt 54.4 kg (120 lb)   SpO2 98%   BMI 17.22 kg/m  Pain Assessment: 0-10   Pain Score: 0-No pain   SpO2: SpO2: 98 % O2 Device:SpO2: 98 % O2 Flow Rate: .   IO: Intake/output summary:  Intake/Output Summary (Last 24 hours) at 11/09/16  1138 Last data filed at 11/09/16 1101  Gross per 24 hour  Intake             1160 ml  Output             1500 ml  Net             -340 ml    LBM: Last BM Date: 11/08/16 Baseline Weight: Weight: 54.4 kg (120 lb) Most recent weight: Weight: 54.4 kg (120 lb)  Palliative Assessment/Data:   Flowsheet Rows   Flowsheet Row Most Recent Value  Intake Tab  Referral Department  Hospitalist  Unit at Time of Referral  Med/Surg Unit  Palliative Care Primary Diagnosis  Other (Comment)  Palliative Care Type  New Palliative care  Reason for referral  Clarify Goals of Care  Date first seen by Palliative Care  11/09/16  Clinical Assessment  Palliative Performance Scale Score  30%  Pain Max last 24 hours  4  Pain Min Last 24 hours  3  Dyspnea Max Last 24 Hours  3  Dyspnea Min Last 24 hours  2  Nausea Max Last 24 Hours  3  Psychosocial & Spiritual Assessment  Palliative Care Outcomes  Patient/Family meeting held?  Yes  Who was at the meeting?  patient, sister       Time In:  8 Time Out:  9 Time Total:  60 min  Greater than 50%  of this time was spent counseling and coordinating care related to the above assessment and plan.  Signed by: Rosalin HawkingZeba Calvyn Kurtzman, MD  [phone]  Please contact Palliative Medicine Team phone at [phone] for questions and concerns.  For individual provider: See Loretha StaplerAmion

## 2016-11-09 NOTE — Progress Notes (Addendum)
PROGRESS NOTE  Edwin Wright  HRC:[id number] DOB: 12/19/1950 DOA: 11/02/2016 PCP: [facility]  Brief Narrative:   The patient is a 65 year old male with history of hypertension, hearing and visual deficits, who presented with poor oral intake, chills, and low back pain for 2 days prior to admission. Initially denied fever, cough, shortness of breath, nausea and vomiting but complained of abdominal discomfort. In the emergency department, he was found to have a fever of 102.53F, heart rate in the low 100s and tachypnea. Although his chest x-ray and urinalysis were negative for signs of infection, he was flu PCR positive. CT of the abdomen and pelvis demonstrated gaseous distention, but no overt colitis. He was started on Tamiflu and IV fluids. He has had ongoing diarrhea and minimal oral intake secondary to nausea and refusal.    Assessment & Plan:   Active Problems:   Hypertension   SIRS (systemic inflammatory response syndrome) (HCC)   Dehydration   Glaucoma   ARF (acute renal failure) (HCC)   Anemia   Protein-calorie malnutrition, severe   Acute kidney injury ([place])   Iron deficiency anemia   Hypokalemia   Hypomagnesemia  SIRS criteria positive secondary to influenza a, fevers resolved. Completed 5 days of tamiflu - Cultures no growth to date  Severe protein calorie malnutrition, still not eating.  Discussed with sister who stated he had been gaining weight initially at the SNF initially but more recently had been losing weight.  Thinks it may be partly due to depression, but he may have an undetected malignancy or mesenteric ischemia contributing to his decline over the last few months.  No evidence of cancer on his CT a/p.  I personally reviewed the CT images and the SMA appears widely patent despite an area of aortic calcification right at the bifurcation of the artery.  Ferritin was very elevated, but this was in the setting of acute influenza.   -  Discussed  possibility of feeding tube -  Palliative care consult appreciated -  Psychiatry consultation called on 12/24 -  Nutrition assistance appreciated -  Continue mirtazapine 7.73m qhs (patietn refused last night) -  Add low dose methylphenidate qAM  Persistent abdominal discomfort with nausea and diarrhea, evaluation for alternative causes of diarrhea.   -  GI pathogen panel awaiting collection -  Continue IV fluids -  Continue scheduled Zofran and when necessary Phenergan -  No pathology evidence on CT ab/pelvis this admission  Hyperferritinemia, likely due to influenza -  Repeat ferritin trending down -  ESR 82 but nonspecific given recent flu.  Trend. -  Screen for cancer:  No evidence of colon cancer on CT -  PSA wnl -  Hepatitis panel for A, B, and C wnl  Acute kidney injury, baseline creatinine of 0.7-0.8. Creatinine trended down from 1.44 to normal with IV fluids  Hypokalemia/hypomagnesemia due to diarrhea and poor oral intake, resolved with supplementation  Mild elevation of AST that resolved with IVF, likely due to dehydration.    Iron deficiency anemia, hgb trended down slightly, likely due to dilution from IVF -Recommend iron supplement prior to discharge  ? Hyperthyroidism -Suppressed TSH and mildly elevated free T4.  -recommendation for repeat TSH in 3-4 weeks  Glaucoma with visual impairment. - Remains stable currently  DVT prophylaxis:  Lovenox Code Status:  Full code Family Communication:  Patient alone today.  Palliative care spoke with sister today  Disposition Plan:  Likely to SNF, but not eating and drinking and refusing medications.  If family considering PEG placement, will arrange for this to be done in hospital.  If family elects comfort, would be a candidate for hospice given his current condition.  Palliative care consult placed and will likely meet with family _0 00 ml  Net             -340 ml   Filed Weights   11/02/16 1504  Weight: 54.4 kg (120 lb)    Examination:  General exam: Thin adult male.  No acute distress.  HEENT:  NCAT, MMM Respiratory system: Clear to auscultation bilaterally Cardiovascular system: Regular rate and rhythm, normal S1/S2. No murmurs, rubs, gallops or clicks.  Warm extremities Gastrointestinal system: hyperactive bowel sounds, soft, nondistended, nontender. MSK:  Normal tone and bulk, no lower extremity edema Neuro:  Grossly intact, no facial droop, able to wiggle hands and toes with prompting.    Data Reviewed: I have personally reviewed following labs and imaging studies  CBC:  Recent Labs Lab 11/03/16 0524 11/08/16 0540 11/09/16 0516  WBC 6.7 8.2 7.5  HGB 11.2* 9.9* 9.8*  HCT 34.1* 30.8* 29.8*  MCV 93.4 95.7 92.8  PLT 292 338 883   Basic Metabolic Panel:  Recent Labs Lab 11/03/16 0524 11/04/16 0508 11/06/16 0548 11/07/16 0503 11/08/16 0540 11/09/16 0516   NA 135 136 143 137 139 138  K 2.8* 3.4* 3.8 3.4* 3.8 3.7  CL 103 108 113* 111 115* 112*  CO2 19* 19* 20* 17* 14* 20*  GLUCOSE 81 102* 95 78 72 122*  BUN 25* _1 CREATININE 1.27* 1.11 0.82 0.73 0.81 0.75  CALCIUM 8.5* 8.6* 9.0 8.4* 8.3* 8.4*  MG 1.8  --   --  1.7  --   --   PHOS 3.1  --   --   --   --   --    GFR: Estimated Creatinine Clearance: 68.9 mL/min (by C-G formula based on SCr of 0.75 mg/dL). Liver Function Tests:  Recent Labs Lab 11/03/16 0524 11/07/16 0503  AST 56* 38  ALT 16* 17  ALKPHOS 59 55  BILITOT  1.4* 0.9  PROT 6.7 6.2*  ALBUMIN 3.5 3.1*   No results for input(s): LIPASE, AMYLASE in the last 168 hours. No results for input(s): AMMONIA in the last 168 hours. Coagulation Profile: No results for input(s): INR, PROTIME in the last 168 hours. Cardiac Enzymes: No results for input(s): CKTOTAL, CKMB, CKMBINDEX, TROPONINI in the last 168 hours. BNP (last 3 results) No results for input(s): PROBNP in the last 8760 hours. HbA1C: No results for input(s): HGBA1C in the last 72 hours. CBG:  Recent Labs Lab 11/02/16 1544  GLUCAP 98   Lipid Profile: No results for input(s): CHOL, HDL, LDLCALC, TRIG, CHOLHDL, LDLDIRECT in the last 72 hours. Thyroid Function Tests: No results for input(s): TSH, T4TOTAL, FREET4, T3FREE, THYROIDAB in the last 72 hours. Anemia Panel:  Recent Labs  11/08/16 1501  FERRITIN 577*   Urine analysis:    Component Value Date/Time   COLORURINE YELLOW 11/02/2016 1702   APPEARANCEUR HAZY (A) 11/02/2016 1702   LABSPEC 1.018 11/02/2016 1702   PHURINE 5.0 11/02/2016 1702   GLUCOSEU NEGATIVE 11/02/2016 1702   HGBUR MODERATE (A) 11/02/2016 1702   BILIRUBINUR NEGATIVE 11/02/2016 1702   KETONESUR 5 (A) 11/02/2016 1702   PROTEINUR 30 (A) 11/02/2016 1702   NITRITE NEGATIVE 11/02/2016 1702   LEUKOCYTESUR NEGATIVE 11/02/2016 1702   Sepsis Labs: _0 (procalcitonin:4,lacticidven:4)  ) Recent Results (from the past 240  hour(s))  Culture, blood (Routine X 2) w Reflex to ID Panel     Status: None   Collection Time: 11/02/16  3:11 PM  Result Value Ref Range Status   Specimen Description BLOOD BLOOD RIGHT FOREARM  Final   Special Requests BOTTLES DRAWN AEROBIC AND ANAEROBIC 5 CC EACH  Final   Culture   Final    NO GROWTH 5 DAYS Performed at [facility]    Report Status 11/07/2016 FINAL  Final  Urine culture     Status: None   Collection Time: 11/02/16  5:02 PM  Result Value Ref Range Status   Specimen Description URINE, CATHETERIZED  Final   Special Requests NONE  Final   Culture NO GROWTH Performed at [facility]   Final   Report Status 11/03/2016 FINAL  Final  Culture, blood (Routine X 2) w Reflex to ID Panel     Status: None   Collection Time: 11/02/16  5:05 PM  Result Value Ref Range Status   Specimen Description BLOOD LEFT ANTECUBITAL  Final   Special Requests BOTTLES DRAWN AEROBIC AND ANAEROBIC 10CC  Final   Culture   Final    NO GROWTH 5 DAYS Performed at [facility]    Report Status 11/07/2016 FINAL  Final  MRSA PCR Screening     Status: None   Collection Time: 11/02/16 11:10 PM  Result Value Ref Range Status   MRSA by PCR NEGATIVE NEGATIVE Final    Comment:        The GeneXpert MRSA Assay (FDA approved for NASAL specimens only), is one component of a comprehensive MRSA colonization surveillance program. It is not intended to diagnose MRSA infection nor to guide or monitor treatment for MRSA infections.       Radiology Studies: No results found.   Scheduled Meds: . brimonidine  1 drop Left Eye TID  . dorzolamide  1 drop Both Eyes BID  . enoxaparin (LOVENOX) injection  40 mg Subcutaneous QHS  . famotidine  20 mg Oral BID  . feeding supplement (ENSURE ENLIVE)  237 mL Oral TID BM  .  latanoprost  1 drop Both Eyes QHS  . mirtazapine  7.5 mg Oral QHS  . ondansetron (ZOFRAN) IV  8 mg Intravenous Q8H   Continuous Infusions: . dextrose 5 % and 0.45 %  NaCl with KCl 20 mEq/L 100 mL/hr at 11/09/16 1056     LOS: 7 days    Time spent: 30 min    Janece Canterbury, MD [facility] Pager [phone]  If 7PM-7AM, please contact night-coverage [email or web address] Password [facility] 11/09/2016, 3:43 PM

## 2016-11-10 LAB — BASIC METABOLIC PANEL
ANION GAP: 7 (ref 5–15)
CALCIUM: 8.6 mg/dL — AB (ref 8.9–10.3)
CO2: 22 mmol/L (ref 22–32)
Chloride: 112 mmol/L — ABNORMAL HIGH (ref 101–111)
Creatinine, Ser: 1.03 mg/dL (ref 0.61–1.24)
GFR calc Af Amer: >60 mL/min (ref >60)
Glucose, Bld: 109 mg/dL — ABNORMAL HIGH (ref 65–99)
POTASSIUM: 3.9 mmol/L (ref 3.5–5.1)
Sodium: 141 mmol/L (ref 135–145)

## 2016-11-10 LAB — CBC
HEMATOCRIT: 32.7 % — AB (ref 39.0–52.0)
HEMOGLOBIN: 10.9 g/dL — AB (ref 13.0–17.0)
MCH: 31.3 pg (ref 26.0–34.0)
MCHC: 33.3 g/dL (ref 30.0–36.0)
MCV: 94 fL (ref 78.0–100.0)
Platelets: 387 10*3/uL (ref 150–400)
RBC: 3.48 MIL/uL — ABNORMAL LOW (ref 4.22–5.81)
RDW: 12.9 % (ref 11.5–15.5)
WBC: 7 10*3/uL (ref 4.0–10.5)

## 2016-11-10 NOTE — Progress Notes (Signed)
PROGRESS NOTE  Edwin Wright  AYT:[id number] DOB: 21-Feb-1951 DOA: 11/02/2016 PCP: [facility]  Brief Narrative:   The patient is a 65 year old male with history of hypertension, hearing and visual deficits, who presented with poor oral intake, chills, and low back pain for 2 days prior to admission. Initially denied fever, cough, shortness of breath, nausea and vomiting but complained of abdominal discomfort. In the emergency department, he was found to have a fever of 102.101F, heart rate in the low 100s and tachypnea. Although his chest x-ray and urinalysis were negative for signs of infection, he was flu PCR positive. CT of the abdomen and pelvis demonstrated gaseous distention, but no overt colitis. He was started on Tamiflu and IV fluids. He has had ongoing diarrhea and minimal oral intake secondary to nausea and refusal.    Assessment & Plan:   Active Problems:   Hypertension   SIRS (systemic inflammatory response syndrome) (HCC)   Dehydration   Glaucoma   ARF (acute renal failure) (HCC)   Anemia   Protein-calorie malnutrition, severe   Acute kidney injury ([place])   Iron deficiency anemia   Hypokalemia   Hypomagnesemia  SIRS criteria positive secondary to influenza a, fevers resolved. Completed 5 days of tamiflu - Cultures no growth to date  Severe protein calorie malnutrition, ate slightly more yesterday.  Likely multifactorial due to recent infection, probable depression and isolation.  No evidence of cancer on his CT a/p and PSA was within normal limits.  I personally reviewed the CT images and the SMA appears widely patent despite an area of aortic calcification right at the bifurcation of the artery.   -  Palliative care consult appreciated -  Psychiatry consultation called on 12/24 -  Nutrition assistance appreciated -  Continue mirtazapine 7.86m qhs day 2 and consider increasing on 12/27 -  Added low dose methylphenidate qAM on 12/25 (read about this on  uptodate)  Persistent abdominal discomfort with nausea and diarrhea due to influenza, diarrhea resolved. -  D/c GI pathogen panel  -  Continue IV fluids -  Continue scheduled Zofran and when necessary Phenergan -  No pathology evidence on CT ab/pelvis this admission  Hyperferritinemia, likely due to influenza, trending down -  Repeat ferritin trending down -  ESR 82 but nonspecific given recent flu.  Repeat in a few weeks and if still elevated, consider steroids. -  Screen for cancer:  No evidence of colon cancer on CT -  PSA wnl -  Hepatitis panel for A, B, and C wnl  Acute kidney injury, baseline creatinine of 0.7-0.8. Creatinine trended down from 1.44 to normal with IV fluids  Hypokalemia/hypomagnesemia due to diarrhea and poor oral intake, resolved with supplementation  Mild elevation of AST that resolved with IVF, likely due to dehydration.    Iron deficiency anemia, hgb trended down slightly, likely due to dilution from IVF -Recommend iron supplement prior to discharge  Hyperthyroidism -Suppressed TSH and mildly elevated free T4.  -recommendation for repeat TSH in 3-4 weeks  Glaucoma with visual impairment. - Remains stable currently  DVT prophylaxis:  Lovenox Code Status:  Full code Family Communication:  Patient alone today Disposition Plan:  Likely to SNF   Consultants:   Palliative care  Psychiatry  Procedures:  None  Antimicrobials:  Anti-infectives    Start     Dose/Rate Route Frequency Ordered Stop   11/06/16 1000  oseltamivir (TAMIFLU) capsule 75 mg     75 mg Oral 2 times daily 11/06/16 0912  11/07/16 0813   11/03/16 0315  oseltamivir (TAMIFLU) capsule 30 mg  Status:  Discontinued     30 mg Oral 2 times daily 11/03/16 0306 11/06/16 0912       Subjective:  Taking medications this morning and more verbal today.  Denies pains but has some nausea after eating.  Minimal appetite.    Objective: Vitals:   11/09/16 0614 11/09/16 1425 11/09/16  2200 11/10/16 0600  BP: (!) 119/56 101/60 (!) 101/59 (!) 125/54  Pulse: 79 62 60 62  Resp: 18 18 18 18   Temp: 98.3 F (36.8 C) 98.8 F (37.1 C) 98.4 F (36.9 C) 98.8 F (37.1 C)  TempSrc: Oral Oral Oral Oral  SpO2: 98% 98% 94% 98%  Weight:      Height:        Intake/Output Summary (Last 24 hours) at 11/10/16 1257 Last data filed at 11/10/16 0800  Gross per 24 hour  Intake              320 ml  Output             1750 ml  Net            -1430 ml   Filed Weights   11/02/16 1504  Weight: 54.4 kg (120 lb)    Examination:  General exam: cachectic adult male.  No acute distress.  Difficult to understand and HOH HEENT:  NCAT, MMM Respiratory system: Clear to auscultation bilaterally Cardiovascular system: Regular rate and rhythm, normal S1/S2. No murmurs, rubs, gallops or clicks.  Warm extremities Gastrointestinal system: normal active bowel sounds, soft, nondistended, nontender. MSK:  Normal tone and bulk, no lower extremity edema Neuro:  Grossly intact, no facial droop, able to wiggle hands and toes with prompting.    Data Reviewed: I have personally reviewed following labs and imaging studies  CBC:  Recent Labs Lab 11/08/16 0540 11/09/16 0516 11/10/16 0540  WBC 8.2 7.5 7.0  HGB 9.9* 9.8* 10.9*  HCT 30.8* 29.8* 32.7*  MCV 95.7 92.8 94.0  PLT 338 366 502   Basic Metabolic Panel:  Recent Labs Lab 11/06/16 0548 11/07/16 0503 11/08/16 0540 11/09/16 0516 11/10/16 0540  NA 143 137 139 138 141  K 3.8 3.4* 3.8 3.7 3.9  CL 113* 111 115* 112* 112*  CO2 20* 17* 14* 20* 22  GLUCOSE 95 78 72 122* 109*  BUN 10 9 8 6  <5*  CREATININE 0.82 0.73 0.81 0.75 1.03  CALCIUM 9.0 8.4* 8.3* 8.4* 8.6*  MG  --  1.7  --   --   --    GFR: Estimated Creatinine Clearance: 53.5 mL/min (by C-G formula based on SCr of 1.03 mg/dL). Liver Function Tests:  Recent Labs Lab 11/07/16 0503  AST 38  ALT 17  ALKPHOS 55  BILITOT 0.9  PROT 6.2*  ALBUMIN 3.1*   No results for  input(s): LIPASE, AMYLASE in the last 168 hours. No results for input(s): AMMONIA in the last 168 hours. Coagulation Profile: No results for input(s): INR, PROTIME in the last 168 hours. Cardiac Enzymes: No results for input(s): CKTOTAL, CKMB, CKMBINDEX, TROPONINI in the last 168 hours. BNP (last 3 results) No results for input(s): PROBNP in the last 8760 hours. HbA1C: No results for input(s): HGBA1C in the last 72 hours. CBG: No results for input(s): GLUCAP in the last 168 hours. Lipid Profile: No results for input(s): CHOL, HDL, LDLCALC, TRIG, CHOLHDL, LDLDIRECT in the last 72 hours. Thyroid Function Tests: No results for input(s): TSH,  T4TOTAL, FREET4, T3FREE, THYROIDAB in the last 72 hours. Anemia Panel:  Recent Labs  11/08/16 1501  FERRITIN 577*   Urine analysis:    Component Value Date/Time   COLORURINE YELLOW 11/02/2016 1702   APPEARANCEUR HAZY (A) 11/02/2016 1702   LABSPEC 1.018 11/02/2016 1702   PHURINE 5.0 11/02/2016 1702   GLUCOSEU NEGATIVE 11/02/2016 1702   HGBUR MODERATE (A) 11/02/2016 1702   BILIRUBINUR NEGATIVE 11/02/2016 1702   KETONESUR 5 (A) 11/02/2016 1702   PROTEINUR 30 (A) 11/02/2016 1702   NITRITE NEGATIVE 11/02/2016 1702   LEUKOCYTESUR NEGATIVE 11/02/2016 1702   Sepsis Labs: @LABRCNTIP (procalcitonin:4,lacticidven:4)  ) Recent Results (from the past 240 hour(s))  Culture, blood (Routine X 2) w Reflex to ID Panel     Status: None   Collection Time: 11/02/16  3:11 PM  Result Value Ref Range Status   Specimen Description BLOOD BLOOD RIGHT FOREARM  Final   Special Requests BOTTLES DRAWN AEROBIC AND ANAEROBIC 5 CC EACH  Final   Culture   Final    NO GROWTH 5 DAYS Performed at [facility]    Report Status 11/07/2016 FINAL  Final  Urine culture     Status: None   Collection Time: 11/02/16  5:02 PM  Result Value Ref Range Status   Specimen Description URINE, CATHETERIZED  Final   Special Requests NONE  Final   Culture NO  GROWTH Performed at [facility]   Final   Report Status 11/03/2016 FINAL  Final  Culture, blood (Routine X 2) w Reflex to ID Panel     Status: None   Collection Time: 11/02/16  5:05 PM  Result Value Ref Range Status   Specimen Description BLOOD LEFT ANTECUBITAL  Final   Special Requests BOTTLES DRAWN AEROBIC AND ANAEROBIC 10CC  Final   Culture   Final    NO GROWTH 5 DAYS Performed at [facility]    Report Status 11/07/2016 FINAL  Final  MRSA PCR Screening     Status: None   Collection Time: 11/02/16 11:10 PM  Result Value Ref Range Status   MRSA by PCR NEGATIVE NEGATIVE Final    Comment:        The GeneXpert MRSA Assay (FDA approved for NASAL specimens only), is one component of a comprehensive MRSA colonization surveillance program. It is not intended to diagnose MRSA infection nor to guide or monitor treatment for MRSA infections.       Radiology Studies: No results found.   Scheduled Meds: . brimonidine  1 drop Left Eye TID  . dorzolamide  1 drop Both Eyes BID  . enoxaparin (LOVENOX) injection  40 mg Subcutaneous QHS  . famotidine  20 mg Oral BID  . feeding supplement (ENSURE ENLIVE)  237 mL Oral TID BM  . latanoprost  1 drop Both Eyes QHS  . methylphenidate  2.5 mg Oral Daily  . mirtazapine  7.5 mg Oral QHS  . ondansetron (ZOFRAN) IV  8 mg Intravenous Q8H   Continuous Infusions: . dextrose 5 % and 0.45 % NaCl with KCl 20 mEq/L 100 mL/hr at 11/09/16 2014     LOS: 8 days    Time spent: 30 min    Janece Canterbury, MD [facility] Pager [phone]  If 7PM-7AM, please contact night-coverage [email or web address] Password [facility] 11/10/2016, 12:57 PM

## 2016-11-11 DIAGNOSIS — J101 Influenza due to other identified influenza virus with other respiratory manifestations: Secondary | ICD-10-CM

## 2016-11-11 LAB — BASIC METABOLIC PANEL
Anion gap: 6 (ref 5–15)
CALCIUM: 8.7 mg/dL — AB (ref 8.9–10.3)
CO2: 23 mmol/L (ref 22–32)
CREATININE: 1 mg/dL (ref 0.61–1.24)
Chloride: 111 mmol/L (ref 101–111)
GFR calc Af Amer: >60 mL/min (ref >60)
GFR calc non Af Amer: >60 mL/min (ref >60)
GLUCOSE: 106 mg/dL — AB (ref 65–99)
Potassium: 3.9 mmol/L (ref 3.5–5.1)
Sodium: 140 mmol/L (ref 135–145)

## 2016-11-11 MED ORDER — ONDANSETRON HCL 4 MG PO TABS: 4.0000 mg | ORAL_TABLET | Freq: Three times a day (TID) | ORAL | 0 refills | Status: DC | PRN

## 2016-11-11 MED ORDER — METHYLPHENIDATE HCL 5 MG PO TABS: 2.5000 mg | ORAL_TABLET | Freq: Every day | ORAL | 0 refills | Status: DC

## 2016-11-11 MED ORDER — ENSURE ENLIVE PO LIQD: 237.0000 mL | Freq: Three times a day (TID) | ORAL | 0 refills | Status: AC

## 2016-11-11 MED ORDER — FERROUS SULFATE 325 (65 FE) MG PO TBEC: 325.0000 mg | DELAYED_RELEASE_TABLET | Freq: Every day | ORAL | 0 refills | Status: DC

## 2016-11-11 MED ORDER — MIRTAZAPINE 7.5 MG PO TABS: 7.5000 mg | ORAL_TABLET | Freq: Every day | ORAL | 0 refills | Status: DC

## 2016-11-11 NOTE — Care Management Important Message (Signed)
Important Message  Patient Details IM Letter given to  Name: Ulyses AmorDonald Daffern MRN: [id number] Date of Birth: 02/06/1951   Medicare Important Message Given:  Yes    Caren MacadamFuller, [place] Web 11/11/2016, 1:09 PM

## 2016-11-11 NOTE — Progress Notes (Signed)
MD made aware of low blood pressure.

## 2016-11-11 NOTE — Progress Notes (Signed)
Gave report to Shanda BumpsJessica, Charity fundraiserN at [facility]. Left number in case she had additional questions.

## 2016-11-11 NOTE — Progress Notes (Signed)
Discussed patient disposition with pt. Sister and physician. Patient has poor intake of at this time and may need Gtube. Pt. sister plans to discuss option with patient today. SNF updated.  CSW will continue to follow and and assist with disposition.

## 2016-11-11 NOTE — Discharge Summary (Addendum)
Physician Discharge Summary  Edwin Wright HBZ:[id number] DOB: 1951-04-19 DOA: 11/02/2016  PCP: [place] date: 11/02/2016 Discharge date: 12/02/2016  Admitted From: Skilled nursing facility  Disposition:  Skilled nursing result, [facility]  Recommendations for Outpatient Follow-up:  1. Palliative care to follow 2. Please obtain BMP/CBC in one week 3. Repeat ESR, ferritin, iron studies, TFTs in 1 month  Home Health:  PT/OT  Equipment/Devices:  none  Discharge Condition:  Stable, improved CODE STATUS:  DO NOT RESUSCITATE/DO NOT INTUBATE  Diet recommendation:  Regular with supplements   Brief/Interim Summary:  The patient is a 64 year old male with history of hypertension, hearing and visual deficits, who presented with poor oral intake, chills, and low back pain for 2 days prior to admission. Initially denied fever, cough, shortness of breath, nausea and vomiting but complained of abdominal discomfort. In the emergency department, he was found to have a fever of 102.53F, heart rate in the low 100s and tachypnea. Although his chest x-ray and urinalysis were negative for signs of infection, he was flu PCR positive. CT of the abdomen and pelvis demonstrated gaseous distention, but no overt colitis. He was started on Tamiflu and IV fluids. He prolonged diarrhea and minimal oral intake secondary to nausea.  He was provided with dietary supplements that had minimal appetite. 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 Palliative care was consulted and psychiatry to determine if there were any underlying medical or psychiatric issues that may be intervening to his anorexia. He was started on mirtazapine for possible depression and to stimulate appetite, to be titrated up slowly at the recommendation of psychiatry to a maximum of 30 mg daily at bedtime. Palliative care nutrition that with the patient to discuss the possibility of a feeding tube, however the patient declined and elected  to try to eat as tolerated. His CODE STATUS was discussed and he elected to be DO NOT RESUSCITATE.  He was also started on low-dose methylphenidate to boost energy and encourage eating.    Discharge Diagnoses:  Principal Problem:   Influenza A Active Problems:   Hypertension   Sepsis ([place])   Dehydration   Glaucoma   ARF (acute renal failure) (HCC)   Anemia   Protein-calorie malnutrition, severe   Acute kidney injury (HCC)   Iron deficiency anemia   Hypokalemia   Hypomagnesemia  Sepsis (fever, tachycardia to 100s, RR 22, AKI and elevated LFTs) secondary to influenza a, fevers resolved. Completed 5 days of tamiflu. Blood cultures remain negative.  Severe protein calorie malnutrition, initially thought to be due to the flu, but had been going on even before admission with weight loss.  Eating slightly more but still only consuming 33% of daily needs.  Likely multifactorial due to depression, isolation, recent illness.  No evidence of cancer on his CT a/p and PSA was within normal limits.  I personally reviewed the CT images and the SMA appears widely patent despite an area of aortic calcification right at the bifurcation of the artery.   -  Palliative care physician facilitated discussion regarding tube feeds -  Psychiatry consultation called on 12/24 and recommended titrating up on mirtazapine -  Nutrition recommended restricted diet and supplements -  Started mirtazapine 7.1m qhs and consider increasing on 12/27 -  Would continue to increase mirtazapine every 3-4 days until goal dose of 342mqhs -  Added low dose methylphenidate qAM as appetite stimulant on 12/25 (read about this on uptodate)  Persistent abdominal discomfort with nausea and diarrhea due to  influenza, diarrhea resolved gradually. He was given IV fluids and antiemetics. There is no pathology evident on his CT of abdomen and pelvis early in this admission to explain persistent abdominal discomfort.    Hyperferritinemia,  likely due to influenza, trending down -  ESR 82 but nonspecific given recent flu.  Repeat in a few weeks and if still elevated, consider steroids. -  Screen for cancer:  No evidence of colon cancer on CT -  PSA wnl -  Hepatitis panel for A, B, and C wnl  Acute kidney injury, baseline creatinine of 0.7-0.8. Creatinine trended down from 1.44 to normal with IV fluids  Hypokalemia/hypomagnesemia due to diarrhea and poor oral intake, resolved with supplementation  Mild elevation of AST that resolved with IVF, likely due to dehydration.    Iron deficiency anemia, hgb trended down slightly, likely due to dilution from IVF -Recommend iron supplement prior to discharge  Hyperthyroidism -Suppressed TSH and mildly elevated free T4.  -recommendation for repeat TSH in 3-4 weeks  Glaucoma with visual impairment. - Remains stable currently  Discharge Instructions  Discharge Instructions    Call MD for:  difficulty breathing, headache or visual disturbances    Complete by:  As directed    Call MD for:  extreme fatigue    Complete by:  As directed    Call MD for:  hives    Complete by:  As directed    Call MD for:  persistant dizziness or light-headedness    Complete by:  As directed    Call MD for:  persistant nausea and vomiting    Complete by:  As directed    Call MD for:  severe uncontrolled pain    Complete by:  As directed    Call MD for:  temperature >100.4    Complete by:  As directed    Diet general    Complete by:  As directed    Increase activity slowly    Complete by:  As directed        Medication List    STOP taking these medications   amLODipine 10 MG tablet Commonly known as:  NORVASC   hydrochlorothiazide 25 MG tablet Commonly known as:  HYDRODIURIL     TAKE these medications   brimonidine 0.2 % ophthalmic solution Commonly known as:  ALPHAGAN Place 1 drop into the left eye 3 (three) times daily.   dorzolamide 2 % ophthalmic solution Commonly  known as:  TRUSOPT Place 1 drop into both eyes 2 (two) times daily.   feeding supplement (ENSURE ENLIVE) Liqd Take 237 mLs by mouth 3 (three) times daily between meals.   ferrous sulfate 325 (65 FE) MG EC tablet Take 1 tablet (325 mg total) by mouth daily with breakfast.   latanoprost 0.005 % ophthalmic solution Commonly known as:  XALATAN Place 1 drop into both eyes at bedtime.   methylphenidate 5 MG tablet Commonly known as:  RITALIN Take 0.5 tablets (2.5 mg total) by mouth daily.   mirtazapine 7.5 MG tablet Commonly known as:  REMERON Take 1 tablet (7.5 mg total) by mouth at bedtime.   ondansetron 4 MG tablet Commonly known as:  ZOFRAN Take 1 tablet (4 mg total) by mouth every 8 (eight) hours as needed for nausea or vomiting.       Contact information for follow-up providers    [facility]. Schedule an appointment as soon as possible for a visit in 1 month(s).   Specialty:  Geriatric Medicine Contact information:  [address] [place] [address] [phone]            Contact information for after-discharge care    [facility] Follow up.   Specialty:  Skilled Nursing Facility Contact information: [address] [place] [phone]                 No Known Allergies  Consultations:   Palliative care  Psychiatry    Procedures/Studies: Ct Abdomen Pelvis W Contrast  Result Date: 11/02/2016 CLINICAL DATA:  Fever, chills, and low back pain. EXAM: CT ABDOMEN AND PELVIS WITH CONTRAST TECHNIQUE: Multidetector CT imaging of the abdomen and pelvis was performed using the standard protocol following bolus administration of intravenous contrast. CONTRAST:  75 mL Isovue-300 COMPARISON:  None. FINDINGS: Lower chest: The minimal dependent atelectasis in the lung bases. No pleural effusion. Hepatobiliary: No focal liver abnormality is seen. No gallstones, gallbladder  wall thickening, or biliary dilatation. Pancreas: Unremarkable. Spleen: Unremarkable. Adrenals/Urinary Tract: Unremarkable adrenal glands. Small low-density renal lesions bilaterally compatible with cysts, the largest measuring 2.1 cm in the interpolar left kidney. No evidence of renal calculi or hydronephrosis. Unremarkable bladder. Stomach/Bowel: The stomach is within normal limits. There is mild gaseous distension of much of the colon, and there is a small volume of liquid stool scattered throughout the colon including in the rectosigmoid region. Oral contrast is present in nondilated loops of small bowel without evidence of obstruction. The appendix is unremarkable. Vascular/Lymphatic: Abdominal aortic atherosclerosis without aneurysm. No enlarged lymph nodes. Reproductive: Unremarkable prostate. Other: No intraperitoneal free fluid. No abdominal wall mass or hernia. Musculoskeletal: Advanced disc degeneration L2-L5. IMPRESSION: 1. Mild gaseous distention of the colon with liquid stool which may reflect a diarrheal illness. 2. No other acute abnormality identified in the abdomen or pelvis. 3. Aortic atherosclerosis. Electronically Signed   By: Logan Bores M.D.   On: 11/02/2016 21:14     Subjective: Taking medications this morning and more verbal today.  Concerned that he still has no appetite and knows he needs to eat more but unable to.  Discharge Exam: Vitals:   11/11/16 0451 11/11/16 1300  BP: (!) 105/49 (!) 110/53  Pulse: 61 70  Resp: 20 18  Temp: 98.5 F (36.9 C) 97.5 F (36.4 C)   Vitals:   11/10/16 1400 11/10/16 1955 11/11/16 0451 11/11/16 1300  BP: 123/76 (!) 106/50 (!) 105/49 (!) 110/53  Pulse: 82 75 61 70  Resp: _0 Temp: 98.8 F (37.1 C) 98.8 F (37.1 C) 98.5 F (36.9 C) 97.5 F (36.4 C)  TempSrc: Oral Oral Oral Oral  SpO2: 100% 99% 98% 99%  Weight:      Height:        General exam:  Cachectic adult male.  No acute distress.  Difficult to understand and  HOH HEENT:  NCAT, MMM Respiratory system: Clear to auscultation bilaterally Cardiovascular system: Regular rate and rhythm, normal S1/S2. No murmurs, rubs, gallops or clicks.  Warm extremities Gastrointestinal system: normal active bowel sounds, soft, nondistended, nontender. MSK:  Normal tone and bulk, no lower extremity edema Neuro:  Grossly intact, right face appears mildly drooped or swollen on cheek, but strength appears symmetric all four extremities    The results of significant diagnostics from this hospitalization (including imaging, microbiology, ancillary and laboratory) are listed below for reference.     Microbiology: No results found for this or any previous visit (from the past  240 hour(s)).   Labs:  Urinalysis    Component Value Date/Time   COLORURINE YELLOW 11/02/2016 1702   APPEARANCEUR HAZY (A) 11/02/2016 1702   LABSPEC 1.018 11/02/2016 1702   PHURINE 5.0 11/02/2016 1702   GLUCOSEU NEGATIVE 11/02/2016 1702   HGBUR MODERATE (A) 11/02/2016 1702   BILIRUBINUR NEGATIVE 11/02/2016 1702   KETONESUR 5 (A) 11/02/2016 1702   PROTEINUR 30 (A) 11/02/2016 1702   NITRITE NEGATIVE 11/02/2016 1702   LEUKOCYTESUR NEGATIVE 11/02/2016 1702   Sepsis Labs Invalid input(s): PROCALCITONIN,  WBC,  LACTICIDVEN   Time coordinating discharge: Over 30 minutes  SIGNED:   Janece Canterbury, MD  [facility] 12/02/2016, 7:33 PM Pager   If 7PM-7AM, please contact night-coverage [email or web address] Password [email or web address]

## 2016-11-11 NOTE — Progress Notes (Signed)
Nutrition Follow-up  DOCUMENTATION CODES:   Severe malnutrition in context of chronic illness  INTERVENTION:  Recommend placement of NG tube for initiation of TF (as bridge to PEG tube placement). When tube placed and confirmed recommend initiating Osmolite 1.5 @ 15 ml/hr and increasing by 15 ml/hr Q12hrs to goal of Osmolite 1.5 @ 45 ml/hr + Pro-Stat 30 ml once daily. Recommend 224 ml free water flushes QID. Goal regimen provides 1720 kcal, 83 grams of protein, 1717 ml H2O daily.  Monitor magnesium, potassium, and phosphorus daily for at least 3 days, MD to replete as needed, as pt is at risk for refeeding syndrome given severe chronic malnutrition, poor PO for 11 days.  Continue Ensure Enlive po BID, each supplement provides 350 kcal and 20 grams of protein.  Continue Magic cup TID with meals, each supplement provides 290 kcal and 9 grams of protein.  NUTRITION DIAGNOSIS:   Malnutrition related to poor appetite, chronic illness as evidenced by severe depletion of body fat, severe depletion of muscle mass.  Ongoing.  GOAL:   Patient will meet greater than or equal to 90% of their needs  Not met.  MONITOR:   PO intake, Supplement acceptance  REASON FOR ASSESSMENT:   Consult Assessment of nutrition requirement/status  ASSESSMENT:   65 y.o. male with medical history significant of HTN, Left wrist join tinfection, hard of hearing, glaucoma vision impairments. Presented with 2 day history of decreased by mouth intake last active shaking chills lower back pain since yesterday. Increased urinary frequency not associated nausea or vomiting question may be had a cough.  -Per Palliative Medicine note on 12/24, sister was agreeable to PEG plaacement if patient still not eating enough. Patient is also on Remeron. Patient remains Full Code with Full Scope Treatment.   Spoke with patient at bedside. Appetite remains poor - though appears to be slight improvement from last week. Patient  reports drinking 1/2 Ensure yesterday. Denies N/V or abdominal pain.  Meal Completion: 0-25% In the past 24 hours patient has had approximately 554 kcal (33% minimum estimated kcal needs) and 26 grams of protein (32% minimum estimated protein needs).  Medications reviewed and include: famotidine, Remeron 7.5 mg daily at bedtime, Zofran 8 mg Q8hrs.  Labs reviewed: BUN <5, Glucose 106. Potassium WNL.  Discussed with RN. Patient may discharge today. Discussed with Dr. Sheran Fava that patient still not meeting needs through PO intake. Plan is to discuss with patient NG tube/PEG.   Diet Order:  DIET DYS 3 Room service appropriate? Yes; Fluid consistency: Thin  Skin:  Reviewed, no issues  Last BM:  11/08/2016  Height:   Ht Readings from Last 1 Encounters:  11/02/16 5' 10"  (1.778 m)    Weight:   Wt Readings from Last 1 Encounters:  11/02/16 120 lb (54.4 kg)    Ideal Body Weight:  75 kg  BMI:  Body mass index is 17.22 kg/m.  Estimated Nutritional Needs:   Kcal:  1700-1900kcal/day   Protein:  81-87g/day   Fluid:  2L/day   EDUCATION NEEDS:   No education needs identified at this time  Willey Blade, MS, RD, LDN Pager: [phone] After Hours Pager: [phone]

## 2016-11-11 NOTE — Clinical Social Work Placement (Signed)
   CLINICAL SOCIAL WORK PLACEMENT  NOTE  Date:  11/11/2016  Patient Details  Name: Edwin Wright MRN: [id number] Date of Birth: 07/10/1951  Clinical Social Work is seeking post-discharge placement for this patient at the Skilled  Nursing Facility level of care (*CSW will initial, date and re-position this form in  chart as items are completed):  Yes   Patient/family provided with [facility] Clinical Social Work Department's list of facilities offering this level of care within the geographic area requested by the patient (or if unable, by the patient's family).  Yes   Patient/family informed of their freedom to choose among providers that offer the needed level of care, that participate in Medicare, Medicaid or managed care program needed by the patient, have an available bed and are willing to accept the patient.  Yes   Patient/family informed of [facility]'s ownership interest [facility] and [facility], as well as of the fact that they are under no obligation to receive care at these facilities.  PASRR submitted to EDS on       PASRR number received on       Existing PASRR number confirmed on 11/11/16     FL2 transmitted to all facilities in geographic area requested by pt/family on       FL2 transmitted to all facilities within larger geographic area on       Patient informed that his/her managed care company has contracts with or will negotiate with certain facilities, including the following:  [facility]     Yes   Patient/family informed of bed offers received.  [facility]     Phys[facility] and [facility]    [facility]  Patient to be transferred to facility by [facility]     Patient family notified on 11/11/16 of transfer.  Name of family member notified:  Sister: Dot     PHYSICIAN Please prepare priority discharge summary,  including medications     Additional Comment:    _______________________________________________ Clearance CootsNicole A Ricki Vanhandel, LCSW 11/11/2016, 10:27 AM

## 2016-11-11 NOTE — Progress Notes (Signed)
PROGRESS NOTE  Edwin Wright  QVZ:[id number] DOB: September 29, 1951 DOA: 11/02/2016 PCP: [facility]  Brief Narrative:   The patient is a 65 year old male with history of hypertension, hearing and visual deficits, who presented with poor oral intake, chills, and low back pain for 2 days prior to admission. Initially denied fever, cough, shortness of breath, nausea and vomiting but complained of abdominal discomfort. In the emergency department, he was found to have a fever of 102.51F, heart rate in the low 100s and tachypnea. Although his chest x-ray and urinalysis were negative for signs of infection, he was flu PCR positive. CT of the abdomen and pelvis demonstrated gaseous distention, but no overt colitis. He was started on Tamiflu and IV fluids. He has had ongoing diarrhea and minimal oral intake secondary to nausea and refusal.    Assessment & Plan:   Active Problems:   Hypertension   SIRS (systemic inflammatory response syndrome) (HCC)   Dehydration   Glaucoma   ARF (acute renal failure) (HCC)   Anemia   Protein-calorie malnutrition, severe   Acute kidney injury ([place])   Iron deficiency anemia   Hypokalemia   Hypomagnesemia  SIRS criteria positive secondary to influenza a, fevers resolved. Completed 5 days of tamiflu - Cultures no growth to date  Severe protein calorie malnutrition, initially thought to be due to the flu, but had been going on even before admission with weight loss.  Eating slightly more but still only consuming 33% of daily needs.  Likely multifactorial due to depression, isolation, recent illness.  No evidence of cancer on his CT a/p and PSA was within normal limits.  I personally reviewed the CT images and the SMA appears widely patent despite an area of aortic calcification right at the bifurcation of the artery.   -  Palliative care consult appreciated -  Psychiatry consultation called on 12/24 -  Nutrition assistance appreciated -  Started  mirtazapine 7.103m qhs and consider increasing on 12/27 -  Would continue to increase mirtazapine every 3-4 days until goal dose of 310mqhs -  Added low dose methylphenidate qAM as appetite stimulant on 12/25 (read about this on uptodate) -  Discussed possibility of feeding tube again today with patient and his sister and they will discuss again this afternoon and make a decision.   -  If NO feeding tube, discharge to SNF with palliative care to follow -  If feeding tube, will place NG tube and consult IR  Persistent abdominal discomfort with nausea and diarrhea due to influenza, diarrhea resolved.   -  Continue IV fluids -  Continue scheduled Zofran and when necessary Phenergan -  No pathology evidence on CT ab/pelvis this admission  Hyperferritinemia, likely due to influenza, trending down -  ESR 82 but nonspecific given recent flu.  Repeat in a few weeks and if still elevated, consider steroids. -  Screen for cancer:  No evidence of colon cancer on CT -  PSA wnl -  Hepatitis panel for A, B, and C wnl  Acute kidney injury, baseline creatinine of 0.7-0.8. Creatinine trended down from 1.44 to normal with IV fluids  Hypokalemia/hypomagnesemia due to diarrhea and poor oral intake, resolved with supplementation  Mild elevation of AST that resolved with IVF, likely due to dehydration.    Iron deficiency anemia, hgb trended down slightly, likely due to dilution from IVF -Recommend iron supplement prior to discharge  Hyperthyroidism -Suppressed TSH and mildly elevated free T4.  -recommendation for repeat TSH in 3-4  weeks  Glaucoma with visual impairment. - Remains stable currently  DVT prophylaxis:  Lovenox Code Status:  Full code Family Communication:  Patient and his sister Ms. Copeland by phone Disposition Plan:  Likely to SNF   Consultants:   Palliative care  Psychiatry  Procedures:  None  Antimicrobials:  Anti-infectives    Start     Dose/Rate Route Frequency  Ordered Stop   11/06/16 1000  oseltamivir (TAMIFLU) capsule 75 mg     75 mg Oral 2 times daily 11/06/16 0912 11/07/16 0813   11/03/16 0315  oseltamivir (TAMIFLU) capsule 30 mg  Status:  Discontinued     30 mg Oral 2 times daily 11/03/16 0306 11/06/16 0912       Subjective:  Taking medications this morning and more verbal today.  Concerned that he still has no appetite and knows he needs to eat more but unable to.  May be amenable to feeding tube, but would like to discuss with his sister more this afternoon.    Objective: Vitals:   11/10/16 0600 11/10/16 1400 11/10/16 1955 11/11/16 0451  BP: (!) 125/54 123/76 (!) 106/50 (!) 105/49  Pulse: 62 82 75 61  Resp: 18 18 18 20   Temp: 98.8 F (37.1 C) 98.8 F (37.1 C) 98.8 F (37.1 C) 98.5 F (36.9 C)  TempSrc: Oral Oral Oral Oral  SpO2: 98% 100% 99% 98%  Weight:      Height:        Intake/Output Summary (Last 24 hours) at 11/11/16 1336 Last data filed at 11/11/16 1610  Gross per 24 hour  Intake              240 ml  Output              550 ml  Net             -310 ml   Filed Weights   11/02/16 1504  Weight: 54.4 kg (120 lb)    Examination:  General exam:  Cachectic adult male.  No acute distress.  Difficult to understand and HOH HEENT:  NCAT, MMM Respiratory system: Clear to auscultation bilaterally Cardiovascular system: Regular rate and rhythm, normal S1/S2. No murmurs, rubs, gallops or clicks.  Warm extremities Gastrointestinal system: normal active bowel sounds, soft, nondistended, nontender. MSK:  Normal tone and bulk, no lower extremity edema Neuro:  Grossly intact, right face appears mildly drooped or swollen on cheek, but strength appears symmetric all four extremities    Data Reviewed: I have personally reviewed following labs and imaging studies  CBC:  Recent Labs Lab 11/08/16 0540 11/09/16 0516 11/10/16 0540  WBC 8.2 7.5 7.0  HGB 9.9* 9.8* 10.9*  HCT 30.8* 29.8* 32.7*  MCV 95.7 92.8 94.0  PLT 338  366 960   Basic Metabolic Panel:  Recent Labs Lab 11/07/16 0503 11/08/16 0540 11/09/16 0516 11/10/16 0540 11/11/16 0510  NA 137 139 138 141 140  K 3.4* 3.8 3.7 3.9 3.9  CL 111 115* 112* 112* 111  CO2 17* 14* 20* 22 23  GLUCOSE 78 72 122* 109* 106*  BUN 9 8 6  <5* <5*  CREATININE 0.73 0.81 0.75 1.03 1.00  CALCIUM 8.4* 8.3* 8.4* 8.6* 8.7*  MG 1.7  --   --   --   --    GFR: Estimated Creatinine Clearance: 55.2 mL/min (by C-G formula based on SCr of 1 mg/dL). Liver Function Tests:  Recent Labs Lab 11/07/16 0503  AST 38  ALT 17  ALKPHOS 55  BILITOT 0.9  PROT 6.2*  ALBUMIN 3.1*   No results for input(s): LIPASE, AMYLASE in the last 168 hours. No results for input(s): AMMONIA in the last 168 hours. Coagulation Profile: No results for input(s): INR, PROTIME in the last 168 hours. Cardiac Enzymes: No results for input(s): CKTOTAL, CKMB, CKMBINDEX, TROPONINI in the last 168 hours. BNP (last 3 results) No results for input(s): PROBNP in the last 8760 hours. HbA1C: No results for input(s): HGBA1C in the last 72 hours. CBG: No results for input(s): GLUCAP in the last 168 hours. Lipid Profile: No results for input(s): CHOL, HDL, LDLCALC, TRIG, CHOLHDL, LDLDIRECT in the last 72 hours. Thyroid Function Tests: No results for input(s): TSH, T4TOTAL, FREET4, T3FREE, THYROIDAB in the last 72 hours. Anemia Panel:  Recent Labs  11/08/16 1501  FERRITIN 577*   Urine analysis:    Component Value Date/Time   COLORURINE YELLOW 11/02/2016 1702   APPEARANCEUR HAZY (A) 11/02/2016 1702   LABSPEC 1.018 11/02/2016 1702   PHURINE 5.0 11/02/2016 1702   GLUCOSEU NEGATIVE 11/02/2016 1702   HGBUR MODERATE (A) 11/02/2016 1702   BILIRUBINUR NEGATIVE 11/02/2016 1702   KETONESUR 5 (A) 11/02/2016 1702   PROTEINUR 30 (A) 11/02/2016 1702   NITRITE NEGATIVE 11/02/2016 1702   LEUKOCYTESUR NEGATIVE 11/02/2016 1702   Sepsis Labs: @LABRCNTIP (procalcitonin:4,lacticidven:4)  ) Recent Results  (from the past 240 hour(s))  Culture, blood (Routine X 2) w Reflex to ID Panel     Status: None   Collection Time: 11/02/16  3:11 PM  Result Value Ref Range Status   Specimen Description BLOOD BLOOD RIGHT FOREARM  Final   Special Requests BOTTLES DRAWN AEROBIC AND ANAEROBIC 5 CC EACH  Final   Culture   Final    NO GROWTH 5 DAYS Performed at [facility]    Report Status 11/07/2016 FINAL  Final  Urine culture     Status: None   Collection Time: 11/02/16  5:02 PM  Result Value Ref Range Status   Specimen Description URINE, CATHETERIZED  Final   Special Requests NONE  Final   Culture NO GROWTH Performed at [facility]   Final   Report Status 11/03/2016 FINAL  Final  Culture, blood (Routine X 2) w Reflex to ID Panel     Status: None   Collection Time: 11/02/16  5:05 PM  Result Value Ref Range Status   Specimen Description BLOOD LEFT ANTECUBITAL  Final   Special Requests BOTTLES DRAWN AEROBIC AND ANAEROBIC 10CC  Final   Culture   Final    NO GROWTH 5 DAYS Performed at [facility]    Report Status 11/07/2016 FINAL  Final  MRSA PCR Screening     Status: None   Collection Time: 11/02/16 11:10 PM  Result Value Ref Range Status   MRSA by PCR NEGATIVE NEGATIVE Final    Comment:        The GeneXpert MRSA Assay (FDA approved for NASAL specimens only), is one component of a comprehensive MRSA colonization surveillance program. It is not intended to diagnose MRSA infection nor to guide or monitor treatment for MRSA infections.       Radiology Studies: No results found.   Scheduled Meds: . brimonidine  1 drop Left Eye TID  . dorzolamide  1 drop Both Eyes BID  . enoxaparin (LOVENOX) injection  40 mg Subcutaneous QHS  . famotidine  20 mg Oral BID  . feeding supplement (ENSURE ENLIVE)  237 mL Oral TID BM  . latanoprost  1 drop Both Eyes QHS  . methylphenidate  2.5 mg Oral Daily  . mirtazapine  7.5 mg Oral QHS  . ondansetron (ZOFRAN) IV  8 mg  Intravenous Q8H   Continuous Infusions:    LOS: 9 days    Time spent: 30 min    Janece Canterbury, MD [facility] Pager [phone]  If 7PM-7AM, please contact night-coverage [email or web address] Password [email or web address] 11/11/2016, 1:36 PM

## 2016-11-11 NOTE — Progress Notes (Signed)
Daily Progress Note   Patient Name: Edwin Wright       Date: 11/11/2016 DOB: [birth year]  Age: 65 y.o. MRN#: [id number] Attending Physician: Janece Canterbury, MD Primary Care Physician: [facility] Admit Date: 11/02/2016  Reason for Consultation/Follow-up: Establishing goals of care  Subjective:  awake alert Resting in bed Family at bedside See family meeting note below:  Length of Stay: 9  Current Medications: Scheduled Meds:  . brimonidine  1 drop Left Eye TID  . dorzolamide  1 drop Both Eyes BID  . enoxaparin (LOVENOX) injection  40 mg Subcutaneous QHS  . famotidine  20 mg Oral BID  . feeding supplement (ENSURE ENLIVE)  237 mL Oral TID BM  . latanoprost  1 drop Both Eyes QHS  . methylphenidate  2.5 mg Oral Daily  . mirtazapine  7.5 mg Oral QHS  . ondansetron (ZOFRAN) IV  8 mg Intravenous Q8H    Continuous Infusions:   PRN Meds: acetaminophen **OR** acetaminophen, chlorproMAZINE, HYDROcodone-acetaminophen, promethazine  Physical Exam         Weak elderly gentleman NAD S1 S2 Clear Abdomen soft No edema Non focal  Vital Signs: BP (!) 105/49 (BP Location: Left Arm) Comment: nurse notified  Pulse 61   Temp 98.5 F (36.9 C) (Oral)   Resp 20   Ht 5' 10"  (1.778 m)   Wt 54.4 kg (120 lb)   SpO2 98%   BMI 17.22 kg/m  SpO2: SpO2: 98 % O2 Device: O2 Device: Not Delivered O2 Flow Rate:    Intake/output summary:  Intake/Output Summary (Last 24 hours) at 11/11/16 1430 Last data filed at 11/11/16 5681  Gross per 24 hour  Intake              240 ml  Output              550 ml  Net             -310 ml   LBM: Last BM Date: 11/08/16 Baseline Weight: Weight: 54.4 kg (120 lb) Most recent weight: Weight: 54.4 kg (120 lb)       Palliative  Assessment/Data:    Flowsheet Rows   Flowsheet Row Most Recent Value  Intake Tab  Referral Department  Hospitalist  Unit at Time of Referral  Med/Surg Unit  Palliative Care Primary  Diagnosis  Other (Comment)  Palliative Care Type  Return patient Palliative Care  Reason for referral  Clarify Goals of Care  Date first seen by Palliative Care  11/09/16  Clinical Assessment  Palliative Performance Scale Score  30%  Pain Max last 24 hours  4  Pain Min Last 24 hours  3  Dyspnea Max Last 24 Hours  3  Dyspnea Min Last 24 hours  2  Nausea Max Last 24 Hours  3  Psychosocial & Spiritual Assessment  Palliative Care Outcomes  Patient/Family meeting held?  Yes  Who was at the meeting?  patient, sister       Patient Active Problem List   Diagnosis Date Noted  . Protein-calorie malnutrition, severe 11/03/2016  . Acute kidney injury ([place])   . Iron deficiency anemia   . Hypokalemia   . Hypomagnesemia   . Hypertension 11/02/2016  . SIRS (systemic inflammatory response syndrome) ([place]) 11/02/2016  . Dehydration 11/02/2016  . Glaucoma 11/02/2016  . ARF (acute renal failure) ([place]) 11/02/2016  . Anemia 11/02/2016  . Joint infection of left wrist ([place]) 07/20/2014    Palliative Care Assessment & Plan   Patient Profile:    Assessment:  65 y.o. male  with past medical history of HTN, hearing and visual deficits, poor oral intake  admitted on 11/02/2016 with flu, severe protein calorie malnutrition, acute kidney injury.    Recommendations/Plan:  Family meeting:  Met with patient, sister Ms Edilia Bo, another sister, niece and several other family members. Dietician also present when I arrived. Patient resting in bed. We reviewed his current hospitalization.   We discussed in detail regarding artificial nutrition and hydration. We discussed about the psychiatry consult and medications such as Remeron. NGT, Dobbhoff tube, PEG tube, tube feedings pros and cons discussed in detail. All  questions answered to the best of my ability. Code status also discussed:  1. Patient elects DNR DNI  2. Patient elects no feeding tube, no NGT, no PEG.   3. Patient wishes to continue with oral feedings, is agreeable to rehab. "I don't know what the fuss is about, I have never been a big eater."   4. Portable DNR signed and kept on chart on the unit.   5. Family asking if patient will go to SNF rehab today, discussed with RN who will notify CSW.    Code Status:    Code Status Orders        Start     Ordered   11/11/16 1431  Do not attempt resuscitation (DNR)  Continuous    Question Answer Comment  In the event of cardiac or respiratory ARREST Do not call a "code blue"   In the event of cardiac or respiratory ARREST Do not perform Intubation, CPR, defibrillation or ACLS   In the event of cardiac or respiratory ARREST Use medication by any route, position, wound care, and other measures to relive pain and suffering. May use oxygen, suction and manual treatment of airway obstruction as needed for comfort.      11/11/16 1430    Code Status History    Date Active Date Inactive Code Status Order ID Comments User Context   11/02/2016 10:01 PM 11/11/2016  2:30 PM Full Code [id number]  Toy Baker, MD Inpatient   07/21/2014  2:17 AM 07/23/2014  5:11 PM Full Code [id number]  Marybelle Killings, MD Inpatient       Prognosis:   < 12 months?  Discharge Planning:  [facility] for rehab  with Palliative care service follow-up  Care plan was discussed with  Patient and family   Thank you for allowing the Palliative Medicine Team to assist in the care of this patient.   Time In: 1400 Time Out: 1435 Total Time 35 Prolonged Time Billed  no       Greater than 50%  of this time was spent counseling and coordinating care related to the above assessment and plan.  Loistine Chance, MD [phone]  Please contact Palliative Medicine Team phone at [phone] for questions and concerns.

## 2016-11-11 NOTE — Progress Notes (Addendum)
Patient declines Gtube.  [facility].  D/C Summary faxed Patient sister updated about discharge to SNF, She plans to meet patient at the facility. [facility] called for transport.   Vivi BarrackNicole Janiya Millirons, Theresia MajorsLCSWA, MSW Clinical Social Worker 5E and Psychiatric Service Line [phone] 11/11/2016  4:41 PM

## 2018-02-03 DIAGNOSIS — H402213 Chronic angle-closure glaucoma, right eye, severe stage: Secondary | ICD-10-CM | POA: Diagnosis not present

## 2018-03-10 DIAGNOSIS — H402213 Chronic angle-closure glaucoma, right eye, severe stage: Secondary | ICD-10-CM | POA: Diagnosis not present

## 2018-03-17 DIAGNOSIS — I10 Essential (primary) hypertension: Secondary | ICD-10-CM | POA: Diagnosis not present

## 2018-03-17 DIAGNOSIS — Z79899 Other long term (current) drug therapy: Secondary | ICD-10-CM | POA: Diagnosis not present

## 2018-03-17 DIAGNOSIS — H409 Unspecified glaucoma: Secondary | ICD-10-CM | POA: Diagnosis not present

## 2018-04-07 DIAGNOSIS — H402213 Chronic angle-closure glaucoma, right eye, severe stage: Secondary | ICD-10-CM | POA: Diagnosis not present

## 2018-04-07 DIAGNOSIS — H401123 Primary open-angle glaucoma, left eye, severe stage: Secondary | ICD-10-CM | POA: Diagnosis not present

## 2018-04-20 DIAGNOSIS — Z79899 Other long term (current) drug therapy: Secondary | ICD-10-CM | POA: Diagnosis not present

## 2018-06-23 DIAGNOSIS — E739 Lactose intolerance, unspecified: Secondary | ICD-10-CM | POA: Diagnosis not present

## 2018-06-23 DIAGNOSIS — H902 Conductive hearing loss, unspecified: Secondary | ICD-10-CM | POA: Diagnosis not present

## 2018-06-23 DIAGNOSIS — I10 Essential (primary) hypertension: Secondary | ICD-10-CM | POA: Diagnosis not present

## 2018-08-04 DIAGNOSIS — H409 Unspecified glaucoma: Secondary | ICD-10-CM | POA: Diagnosis not present

## 2018-08-04 DIAGNOSIS — I10 Essential (primary) hypertension: Secondary | ICD-10-CM | POA: Diagnosis not present

## 2018-08-11 DIAGNOSIS — H401123 Primary open-angle glaucoma, left eye, severe stage: Secondary | ICD-10-CM | POA: Diagnosis not present

## 2018-08-25 DIAGNOSIS — Z23 Encounter for immunization: Secondary | ICD-10-CM | POA: Diagnosis not present

## 2018-09-29 DIAGNOSIS — H902 Conductive hearing loss, unspecified: Secondary | ICD-10-CM | POA: Diagnosis not present

## 2018-09-30 DIAGNOSIS — H402213 Chronic angle-closure glaucoma, right eye, severe stage: Secondary | ICD-10-CM | POA: Diagnosis not present

## 2018-10-05 DIAGNOSIS — I10 Essential (primary) hypertension: Secondary | ICD-10-CM | POA: Diagnosis not present

## 2018-11-03 DIAGNOSIS — H42 Glaucoma in diseases classified elsewhere: Secondary | ICD-10-CM | POA: Diagnosis not present

## 2018-11-03 DIAGNOSIS — I10 Essential (primary) hypertension: Secondary | ICD-10-CM | POA: Diagnosis not present

## 2018-11-03 DIAGNOSIS — N179 Acute kidney failure, unspecified: Secondary | ICD-10-CM | POA: Diagnosis not present

## 2018-11-30 DIAGNOSIS — H16223 Keratoconjunctivitis sicca, not specified as Sjogren's, bilateral: Secondary | ICD-10-CM | POA: Diagnosis not present

## 2018-11-30 DIAGNOSIS — H402213 Chronic angle-closure glaucoma, right eye, severe stage: Secondary | ICD-10-CM | POA: Diagnosis not present

## 2019-01-19 DIAGNOSIS — R110 Nausea: Secondary | ICD-10-CM | POA: Diagnosis not present

## 2019-01-19 DIAGNOSIS — I10 Essential (primary) hypertension: Secondary | ICD-10-CM | POA: Diagnosis not present

## 2019-02-01 DIAGNOSIS — Z79899 Other long term (current) drug therapy: Secondary | ICD-10-CM | POA: Diagnosis not present

## 2019-04-20 DIAGNOSIS — D7589 Other specified diseases of blood and blood-forming organs: Secondary | ICD-10-CM | POA: Diagnosis not present

## 2019-04-20 DIAGNOSIS — I10 Essential (primary) hypertension: Secondary | ICD-10-CM | POA: Diagnosis not present

## 2019-04-20 DIAGNOSIS — E8809 Other disorders of plasma-protein metabolism, not elsewhere classified: Secondary | ICD-10-CM | POA: Diagnosis not present

## 2019-04-22 DIAGNOSIS — D7589 Other specified diseases of blood and blood-forming organs: Secondary | ICD-10-CM | POA: Diagnosis not present

## 2019-04-22 DIAGNOSIS — E8809 Other disorders of plasma-protein metabolism, not elsewhere classified: Secondary | ICD-10-CM | POA: Diagnosis not present

## 2019-04-22 DIAGNOSIS — I10 Essential (primary) hypertension: Secondary | ICD-10-CM | POA: Diagnosis not present

## 2019-05-07 DIAGNOSIS — D7589 Other specified diseases of blood and blood-forming organs: Secondary | ICD-10-CM | POA: Diagnosis not present

## 2019-05-07 DIAGNOSIS — Z79899 Other long term (current) drug therapy: Secondary | ICD-10-CM | POA: Diagnosis not present

## 2019-05-10 DIAGNOSIS — D7589 Other specified diseases of blood and blood-forming organs: Secondary | ICD-10-CM | POA: Diagnosis not present

## 2019-05-10 DIAGNOSIS — Z79899 Other long term (current) drug therapy: Secondary | ICD-10-CM | POA: Diagnosis not present

## 2019-05-25 DIAGNOSIS — R790 Abnormal level of blood mineral: Secondary | ICD-10-CM | POA: Diagnosis not present

## 2019-05-25 DIAGNOSIS — H409 Unspecified glaucoma: Secondary | ICD-10-CM | POA: Diagnosis not present

## 2019-05-25 DIAGNOSIS — H902 Conductive hearing loss, unspecified: Secondary | ICD-10-CM | POA: Diagnosis not present

## 2019-05-26 DIAGNOSIS — H42 Glaucoma in diseases classified elsewhere: Secondary | ICD-10-CM | POA: Diagnosis not present

## 2019-05-26 DIAGNOSIS — H902 Conductive hearing loss, unspecified: Secondary | ICD-10-CM | POA: Diagnosis not present

## 2019-06-03 DIAGNOSIS — Z79899 Other long term (current) drug therapy: Secondary | ICD-10-CM | POA: Diagnosis not present

## 2019-06-03 DIAGNOSIS — E8809 Other disorders of plasma-protein metabolism, not elsewhere classified: Secondary | ICD-10-CM | POA: Diagnosis not present

## 2019-06-03 DIAGNOSIS — I10 Essential (primary) hypertension: Secondary | ICD-10-CM | POA: Diagnosis not present

## 2019-06-07 DIAGNOSIS — Z79899 Other long term (current) drug therapy: Secondary | ICD-10-CM | POA: Diagnosis not present

## 2019-06-07 DIAGNOSIS — E8809 Other disorders of plasma-protein metabolism, not elsewhere classified: Secondary | ICD-10-CM | POA: Diagnosis not present

## 2019-06-07 DIAGNOSIS — I10 Essential (primary) hypertension: Secondary | ICD-10-CM | POA: Diagnosis not present

## 2019-06-22 DIAGNOSIS — N179 Acute kidney failure, unspecified: Secondary | ICD-10-CM | POA: Diagnosis not present

## 2019-06-22 DIAGNOSIS — D7589 Other specified diseases of blood and blood-forming organs: Secondary | ICD-10-CM | POA: Diagnosis not present

## 2019-06-22 DIAGNOSIS — I10 Essential (primary) hypertension: Secondary | ICD-10-CM | POA: Diagnosis not present

## 2019-06-22 DIAGNOSIS — D6489 Other specified anemias: Secondary | ICD-10-CM | POA: Diagnosis not present

## 2019-07-18 DIAGNOSIS — I10 Essential (primary) hypertension: Secondary | ICD-10-CM | POA: Diagnosis not present

## 2019-07-18 DIAGNOSIS — Z789 Other specified health status: Secondary | ICD-10-CM | POA: Diagnosis not present

## 2019-07-18 DIAGNOSIS — H42 Glaucoma in diseases classified elsewhere: Secondary | ICD-10-CM | POA: Diagnosis not present

## 2019-08-15 DIAGNOSIS — I10 Essential (primary) hypertension: Secondary | ICD-10-CM | POA: Diagnosis not present

## 2019-08-15 DIAGNOSIS — Z9189 Other specified personal risk factors, not elsewhere classified: Secondary | ICD-10-CM | POA: Diagnosis not present

## 2019-09-12 DIAGNOSIS — I10 Essential (primary) hypertension: Secondary | ICD-10-CM | POA: Diagnosis not present

## 2019-09-12 DIAGNOSIS — H42 Glaucoma in diseases classified elsewhere: Secondary | ICD-10-CM | POA: Diagnosis not present

## 2019-10-10 DIAGNOSIS — I10 Essential (primary) hypertension: Secondary | ICD-10-CM | POA: Diagnosis not present

## 2019-10-10 DIAGNOSIS — H42 Glaucoma in diseases classified elsewhere: Secondary | ICD-10-CM | POA: Diagnosis not present

## 2019-10-31 DIAGNOSIS — I10 Essential (primary) hypertension: Secondary | ICD-10-CM | POA: Diagnosis not present

## 2019-10-31 DIAGNOSIS — H42 Glaucoma in diseases classified elsewhere: Secondary | ICD-10-CM | POA: Diagnosis not present

## 2019-10-31 DIAGNOSIS — Z0000 Encounter for general adult medical examination without abnormal findings: Secondary | ICD-10-CM | POA: Diagnosis not present

## 2019-11-07 DIAGNOSIS — I10 Essential (primary) hypertension: Secondary | ICD-10-CM | POA: Diagnosis not present

## 2019-11-07 DIAGNOSIS — Z9189 Other specified personal risk factors, not elsewhere classified: Secondary | ICD-10-CM | POA: Diagnosis not present

## 2019-12-05 DIAGNOSIS — I10 Essential (primary) hypertension: Secondary | ICD-10-CM | POA: Diagnosis not present

## 2020-01-02 DIAGNOSIS — I10 Essential (primary) hypertension: Secondary | ICD-10-CM | POA: Diagnosis not present

## 2020-01-02 DIAGNOSIS — H42 Glaucoma in diseases classified elsewhere: Secondary | ICD-10-CM | POA: Diagnosis not present

## 2020-01-30 DIAGNOSIS — H42 Glaucoma in diseases classified elsewhere: Secondary | ICD-10-CM | POA: Diagnosis not present

## 2020-01-30 DIAGNOSIS — I10 Essential (primary) hypertension: Secondary | ICD-10-CM | POA: Diagnosis not present

## 2020-01-30 DIAGNOSIS — Z9189 Other specified personal risk factors, not elsewhere classified: Secondary | ICD-10-CM | POA: Diagnosis not present

## 2020-02-01 DIAGNOSIS — Z79899 Other long term (current) drug therapy: Secondary | ICD-10-CM | POA: Diagnosis not present

## 2020-02-01 DIAGNOSIS — D6489 Other specified anemias: Secondary | ICD-10-CM | POA: Diagnosis not present

## 2020-02-01 DIAGNOSIS — I10 Essential (primary) hypertension: Secondary | ICD-10-CM | POA: Diagnosis not present

## 2020-02-27 DIAGNOSIS — Z9189 Other specified personal risk factors, not elsewhere classified: Secondary | ICD-10-CM | POA: Diagnosis not present

## 2020-02-27 DIAGNOSIS — I10 Essential (primary) hypertension: Secondary | ICD-10-CM | POA: Diagnosis not present

## 2020-03-26 DIAGNOSIS — H42 Glaucoma in diseases classified elsewhere: Secondary | ICD-10-CM | POA: Diagnosis not present

## 2020-03-26 DIAGNOSIS — Z9189 Other specified personal risk factors, not elsewhere classified: Secondary | ICD-10-CM | POA: Diagnosis not present

## 2020-03-26 DIAGNOSIS — I10 Essential (primary) hypertension: Secondary | ICD-10-CM | POA: Diagnosis not present

## 2020-04-23 DIAGNOSIS — I10 Essential (primary) hypertension: Secondary | ICD-10-CM | POA: Diagnosis not present

## 2020-07-24 DIAGNOSIS — H42 Glaucoma in diseases classified elsewhere: Secondary | ICD-10-CM | POA: Diagnosis not present

## 2020-07-24 DIAGNOSIS — I10 Essential (primary) hypertension: Secondary | ICD-10-CM | POA: Diagnosis not present

## 2020-09-04 DIAGNOSIS — I10 Essential (primary) hypertension: Secondary | ICD-10-CM | POA: Diagnosis not present

## 2020-09-04 DIAGNOSIS — H42 Glaucoma in diseases classified elsewhere: Secondary | ICD-10-CM | POA: Diagnosis not present

## 2020-11-13 DIAGNOSIS — H42 Glaucoma in diseases classified elsewhere: Secondary | ICD-10-CM | POA: Diagnosis not present

## 2020-11-13 DIAGNOSIS — I10 Essential (primary) hypertension: Secondary | ICD-10-CM | POA: Diagnosis not present

## 2021-01-03 DIAGNOSIS — H402213 Chronic angle-closure glaucoma, right eye, severe stage: Secondary | ICD-10-CM | POA: Diagnosis not present

## 2021-03-25 DIAGNOSIS — Z79899 Other long term (current) drug therapy: Secondary | ICD-10-CM | POA: Diagnosis not present

## 2021-03-25 DIAGNOSIS — Z1159 Encounter for screening for other viral diseases: Secondary | ICD-10-CM | POA: Diagnosis not present

## 2021-03-25 DIAGNOSIS — Z008 Encounter for other general examination: Secondary | ICD-10-CM | POA: Diagnosis not present

## 2021-03-25 DIAGNOSIS — F17210 Nicotine dependence, cigarettes, uncomplicated: Secondary | ICD-10-CM | POA: Diagnosis not present

## 2021-03-25 DIAGNOSIS — H409 Unspecified glaucoma: Secondary | ICD-10-CM | POA: Diagnosis not present

## 2021-03-25 DIAGNOSIS — E46 Unspecified protein-calorie malnutrition: Secondary | ICD-10-CM | POA: Diagnosis not present

## 2021-03-25 DIAGNOSIS — H9190 Unspecified hearing loss, unspecified ear: Secondary | ICD-10-CM | POA: Diagnosis not present

## 2021-03-25 DIAGNOSIS — H548 Legal blindness, as defined in USA: Secondary | ICD-10-CM | POA: Diagnosis not present

## 2021-03-25 DIAGNOSIS — I10 Essential (primary) hypertension: Secondary | ICD-10-CM | POA: Diagnosis not present

## 2021-03-25 DIAGNOSIS — Z0000 Encounter for general adult medical examination without abnormal findings: Secondary | ICD-10-CM | POA: Diagnosis not present

## 2021-05-24 ENCOUNTER — Encounter (HOSPITAL_COMMUNITY): Payer: Self-pay

## 2021-05-24 ENCOUNTER — Other Ambulatory Visit: Payer: Self-pay

## 2021-05-24 ENCOUNTER — Ambulatory Visit (HOSPITAL_COMMUNITY)
Admission: EM | Admit: 2021-05-24 | Discharge: 2021-05-24 | Disposition: A | Discharge status: Home / Self Care | Payer: Medicare Other

## 2021-05-24 DIAGNOSIS — M542 Cervicalgia: Secondary | ICD-10-CM | POA: Diagnosis not present

## 2021-05-24 MED ORDER — KETOROLAC TROMETHAMINE 30 MG/ML IJ SOLN
30.0000 mg | Freq: Once | INTRAMUSCULAR | Status: AC
  Administered 2021-05-24: 30 mg via INTRAMUSCULAR

## 2021-05-24 MED ORDER — PREDNISONE 20 MG PO TABS: 40.0000 mg | ORAL_TABLET | Freq: Every day | ORAL | 0 refills | Status: DC

## 2021-05-24 MED ORDER — KETOROLAC TROMETHAMINE 30 MG/ML IJ SOLN
INTRAMUSCULAR | Status: AC
  Filled 2021-05-24: qty 1

## 2021-05-24 NOTE — ED Provider Notes (Addendum)
[facility]    CSN: [id number] Arrival date & time: 05/24/21  1125      History   Chief Complaint Chief Complaint  Patient presents with   Neck Pain    HPI Edwin Wright is a 70 y.o. male.   Patient presents with neck pain predominantly on the left side beginning 4 days ago.  Denies any injury or trauma, thinks it may be related to how he had been sleeping.  Pain worsened with right and left rotation.  Can be felt with flexion and extension but not as bad.  Has been consistently using Aleve and heating pad with no relief.  Pain is interfering with sleep.  Denies any numbness, numbness, fever, chills, lethargy, fatigue,no new rashes, or irritability.  Patient is hearing and visually impaired.  History is gathered from patient and family.  Past Medical History:  Diagnosis Date   Glaucoma    HOH (hard of hearing)    Hypertension     Patient Active Problem List   Diagnosis Date Noted   Influenza A 11/11/2016   Protein-calorie malnutrition, severe 11/03/2016   Acute kidney injury (HCC)    Iron deficiency anemia    Hypokalemia    Hypomagnesemia    Hypertension 11/02/2016   Sepsis (HCC) 11/02/2016   Dehydration 11/02/2016   Glaucoma 11/02/2016   ARF (acute renal failure) (HCC) 11/02/2016   Anemia 11/02/2016   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    Past Surgical History:  Procedure Laterality Date   ARTHROTOMY Left 07/21/2014   Procedure: ARTHROTOMY; IRRIGATION AND DRAINAGE OF LEFT WRIST INFECTION;  Surgeon: Eldred Manges, MD;  Location: [facility] OR;  Service: Orthopedics;  Laterality: Left;       Home Medications    Prior to Admission medications   Medication Sig Start Date End Date Taking? Authorizing Provider  predniSONE (DELTASONE) 20 MG tablet Take 2 tablets (40 mg total) by mouth daily. 05/24/21  Yes Alejandro Gamel R, NP  brimonidine (ALPHAGAN) 0.2 % ophthalmic solution Place 1 drop into the left eye 3 (three) times daily.    [provider]  dorzolamide (TRUSOPT) 2 % ophthalmic solution Place 1 drop into both eyes 2 (two) times daily.    [provider]  dorzolamide-timolol (COSOPT) 22.3-6.8 MG/ML ophthalmic solution 1 drop 2 (two) times daily. 04/17/21   [provider]  feeding supplement, ENSURE ENLIVE, (ENSURE ENLIVE) LIQD Take 237 mLs by mouth 3 (three) times daily between meals. 11/11/16   Renae Fickle, MD  ferrous sulfate 325 (65 FE) MG EC tablet Take 1 tablet (325 mg total) by mouth daily with breakfast. 11/11/16   Renae Fickle, MD  hydrALAZINE (APRESOLINE) 25 MG tablet  02/22/21   [provider]  latanoprost (XALATAN) 0.005 % ophthalmic solution Place 1 drop into both eyes at bedtime.    [provider]  methylphenidate (RITALIN) 5 MG tablet Take 0.5 tablets (2.5 mg total) by mouth daily. 11/12/16   Renae Fickle, MD  mirtazapine (REMERON) 15 MG tablet Take 15 mg by mouth at bedtime. 04/23/21   [provider]  mirtazapine (REMERON) 7.5 MG tablet Take 1 tablet (7.5 mg total) by mouth at bedtime. 11/11/16   Renae Fickle, MD  ondansetron (ZOFRAN) 4 MG tablet Take 1 tablet (4 mg total) by mouth every 8 (eight) hours as needed for nausea or vomiting. 11/11/16   Renae Fickle, MD  RESTASIS 0.05 % ophthalmic emulsion 1 drop 2 (two) times daily. 02/26/21   [provider]    Family History Family History  Problem Relation Age of Onset   Breast cancer Sister    Cancer Sister    Diabetes Sister    Hypertension Sister    Diabetes Brother    CAD Neg Hx    Stroke Neg Hx     Social History Social History   Tobacco Use   Smoking status: Every Day    Pack years: 0.00    Types: Cigarettes   Smokeless tobacco: Never  Vaping Use   Vaping Use: Never used  Substance Use Topics   Alcohol use: No   Drug use: No     Allergies   Lactose intolerance (gi)   Review of Systems Review of Systems  Constitutional: Negative.   Respiratory: Negative.     Cardiovascular: Negative.   Musculoskeletal:  Positive for neck pain. Negative for arthralgias, back pain, gait problem, joint swelling, myalgias and neck stiffness.  Skin: Negative.   Neurological: Negative.     Physical Exam Triage Vital Signs ED Triage Vitals,  Enc Vitals Group     BP (!) 150/77     Pulse Rate 88     Resp 18     Temp 98.7 F (37.1 C)     Temp Source Oral     SpO2 98 %     Weight      Height      Head Circumference      Peak Flow      Pain Score      Pain Loc      Pain Edu?      Excl. in GC?    No data found.  Updated Vital Signs BP (!) 150/77 (BP Location: Right Arm)   Pulse 88   Temp 98.7 F (37.1 C) (Oral)   Resp 18   SpO2 98%   Visual Acuity Right Eye Distance:   Left Eye Distance:   Bilateral Distance:    Right Eye Near:   Left Eye Near:    Bilateral Near:     Physical Exam Constitutional:      Appearance: Normal appearance. He is normal weight.  HENT:     Head: Normocephalic.  Neck:      Comments: No point tenderness, swelling, adenopathy noted.  Able to complete range of motion but elicits  Pulmonary:     Effort: Pulmonary effort is normal.  Musculoskeletal:     Cervical back: Pain with movement and muscular tenderness present. No spinous process tenderness.  Skin:    General: Skin is warm and dry.  Neurological:     Mental Status: He is alert and oriented to person, place, and time. Mental status is at baseline.  Psychiatric:        Mood and Affect: Mood normal.        Behavior: Behavior normal.  Acute neck pain   UC Treatments / Results  Labs (all labs ordered are listed, but only abnormal results are displayed) Labs Reviewed - No data to display  EKG   Radiology No results found.  Procedures Procedures (including critical care time)  Medications Ordered in UC Medications  ketorolac (TORADOL) 30 MG/ML injection 30 mg (has no administration in time range)    Initial Impression / Assessment and Plan / UC  Course  I have reviewed the triage vital signs and the nursing notes.  Pertinent labs & imaging results that were available during my care of the patient were reviewed by me and considered in my  medical decision making (see chart for details).  Acute neck pain  1.  Toradol 30 mg now 2.  Prednisone 40 daily for 5 days 3.  Continue use of heating pad for comfort 4.  Gentle stretching exercises once pain better. 5.  For persistent pain please follow-up with orthopedic specialist for evaluation Final Clinical Impressions(s) / UC Diagnoses   Final diagnoses:  Neck pain     Discharge Instructions      The neck pain that you experience today is most likely muscular  Starting tomorrow morning  take 2 pills of prednisone with food, take prednisone for the next 5 days, if needing additional comfort throughout the day while on medication you may use 650 mg of Tylenol every 6 hours  On completion of medication you may resume using Aleve for comfort  Can continue use of heating pad as tolerated for additional comfort  As pain lessens stretch neck muscles by turning and holding neck in each direction  If pain persist or worsens after completion of medication you may return to urgent care or follow-up with orthopedic specialist, information listed below, for further evaluation   ED Prescriptions     Medication Sig Dispense Auth. Provider   predniSONE (DELTASONE) 20 MG tablet Take 2 tablets (40 mg total) by mouth daily. 10 tablet Valinda Hoar, NP      PDMP not reviewed this encounter.   Valinda Hoar, NP 05/24/21 1321    Salli Quarry R, NP 05/24/21 1323

## 2021-05-24 NOTE — ED Triage Notes (Signed)
Pt presents with left-sided neck pain x 4 days. Heating pads, Motrin gives no relief.

## 2021-05-24 NOTE — Discharge Instructions (Addendum)
The neck pain that you experience today is most likely muscular  Starting tomorrow morning  take 2 pills of prednisone with food, take prednisone for the next 5 days, if needing additional comfort throughout the day while on medication you may use 650 mg of Tylenol every 6 hours  On completion of medication you may resume using Aleve for comfort  Can continue use of heating pad as tolerated for additional comfort  As pain lessens stretch neck muscles by turning and holding neck in each direction  If pain persist or worsens after completion of medication you may return to urgent care or follow-up with orthopedic specialist, information listed below, for further evaluation

## 2021-06-04 ENCOUNTER — Emergency Department (HOSPITAL_COMMUNITY): Payer: Medicare Other

## 2021-06-04 ENCOUNTER — Inpatient Hospital Stay (HOSPITAL_COMMUNITY)
Admission: EM | Admit: 2021-06-04 | Discharge: 2021-06-13 | DRG: 175 | Disposition: A | Discharge status: Skilled Nursing Facility | Payer: Medicare Other | Attending: Internal Medicine | Admitting: Internal Medicine

## 2021-06-04 ENCOUNTER — Encounter (HOSPITAL_COMMUNITY): Payer: Self-pay

## 2021-06-04 ENCOUNTER — Other Ambulatory Visit: Payer: Self-pay

## 2021-06-04 DIAGNOSIS — I82452 Acute embolism and thrombosis of left peroneal vein: Secondary | ICD-10-CM | POA: Diagnosis present

## 2021-06-04 DIAGNOSIS — H9190 Unspecified hearing loss, unspecified ear: Secondary | ICD-10-CM | POA: Diagnosis present

## 2021-06-04 DIAGNOSIS — R54 Age-related physical debility: Secondary | ICD-10-CM | POA: Diagnosis present

## 2021-06-04 DIAGNOSIS — G4700 Insomnia, unspecified: Secondary | ICD-10-CM | POA: Diagnosis present

## 2021-06-04 DIAGNOSIS — F17210 Nicotine dependence, cigarettes, uncomplicated: Secondary | ICD-10-CM | POA: Diagnosis present

## 2021-06-04 DIAGNOSIS — Z66 Do not resuscitate: Secondary | ICD-10-CM | POA: Diagnosis present

## 2021-06-04 DIAGNOSIS — J9601 Acute respiratory failure with hypoxia: Secondary | ICD-10-CM | POA: Diagnosis present

## 2021-06-04 DIAGNOSIS — I82412 Acute embolism and thrombosis of left femoral vein: Secondary | ICD-10-CM | POA: Diagnosis present

## 2021-06-04 DIAGNOSIS — Z8249 Family history of ischemic heart disease and other diseases of the circulatory system: Secondary | ICD-10-CM

## 2021-06-04 DIAGNOSIS — I2694 Multiple subsegmental pulmonary emboli without acute cor pulmonale: Secondary | ICD-10-CM | POA: Diagnosis not present

## 2021-06-04 DIAGNOSIS — R52 Pain, unspecified: Secondary | ICD-10-CM

## 2021-06-04 DIAGNOSIS — I10 Essential (primary) hypertension: Secondary | ICD-10-CM | POA: Diagnosis present

## 2021-06-04 DIAGNOSIS — F32A Depression, unspecified: Secondary | ICD-10-CM | POA: Diagnosis present

## 2021-06-04 DIAGNOSIS — N179 Acute kidney failure, unspecified: Secondary | ICD-10-CM | POA: Diagnosis present

## 2021-06-04 DIAGNOSIS — J690 Pneumonitis due to inhalation of food and vomit: Secondary | ICD-10-CM | POA: Diagnosis not present

## 2021-06-04 DIAGNOSIS — J189 Pneumonia, unspecified organism: Secondary | ICD-10-CM

## 2021-06-04 DIAGNOSIS — I2699 Other pulmonary embolism without acute cor pulmonale: Secondary | ICD-10-CM | POA: Diagnosis present

## 2021-06-04 DIAGNOSIS — Z681 Body mass index (BMI) 19 or less, adult: Secondary | ICD-10-CM

## 2021-06-04 DIAGNOSIS — I129 Hypertensive chronic kidney disease with stage 1 through stage 4 chronic kidney disease, or unspecified chronic kidney disease: Secondary | ICD-10-CM | POA: Diagnosis present

## 2021-06-04 DIAGNOSIS — Z716 Tobacco abuse counseling: Secondary | ICD-10-CM

## 2021-06-04 DIAGNOSIS — I82442 Acute embolism and thrombosis of left tibial vein: Secondary | ICD-10-CM | POA: Diagnosis present

## 2021-06-04 DIAGNOSIS — I82432 Acute embolism and thrombosis of left popliteal vein: Secondary | ICD-10-CM | POA: Diagnosis present

## 2021-06-04 DIAGNOSIS — R636 Underweight: Secondary | ICD-10-CM | POA: Diagnosis present

## 2021-06-04 DIAGNOSIS — H409 Unspecified glaucoma: Secondary | ICD-10-CM | POA: Diagnosis present

## 2021-06-04 DIAGNOSIS — M542 Cervicalgia: Secondary | ICD-10-CM | POA: Diagnosis present

## 2021-06-04 DIAGNOSIS — D509 Iron deficiency anemia, unspecified: Secondary | ICD-10-CM | POA: Diagnosis present

## 2021-06-04 DIAGNOSIS — Z974 Presence of external hearing-aid: Secondary | ICD-10-CM

## 2021-06-04 DIAGNOSIS — Z20822 Contact with and (suspected) exposure to covid-19: Secondary | ICD-10-CM | POA: Diagnosis present

## 2021-06-04 DIAGNOSIS — E739 Lactose intolerance, unspecified: Secondary | ICD-10-CM | POA: Diagnosis present

## 2021-06-04 DIAGNOSIS — R066 Hiccough: Secondary | ICD-10-CM | POA: Diagnosis present

## 2021-06-04 DIAGNOSIS — N1831 Chronic kidney disease, stage 3a: Secondary | ICD-10-CM | POA: Diagnosis present

## 2021-06-04 DIAGNOSIS — Z79899 Other long term (current) drug therapy: Secondary | ICD-10-CM

## 2021-06-04 LAB — CBC WITH DIFFERENTIAL/PLATELET
Abs Immature Granulocytes: 0.05 10*3/uL (ref 0.00–0.07)
Basophils Absolute: 0 10*3/uL (ref 0.0–0.1)
Basophils Relative: 1 %
Eosinophils Absolute: 0.2 10*3/uL (ref 0.0–0.5)
Eosinophils Relative: 2 %
HCT: 38.4 % — ABNORMAL LOW (ref 39.0–52.0)
Hemoglobin: 11.7 g/dL — ABNORMAL LOW (ref 13.0–17.0)
Immature Granulocytes: 1 %
Lymphocytes Relative: 27 %
Lymphs Abs: 2.3 10*3/uL (ref 0.7–4.0)
MCH: 29.9 pg (ref 26.0–34.0)
MCHC: 30.5 g/dL (ref 30.0–36.0)
MCV: 98.2 fL (ref 80.0–100.0)
Monocytes Absolute: 1.1 10*3/uL — ABNORMAL HIGH (ref 0.1–1.0)
Monocytes Relative: 13 %
Neutro Abs: 5.1 10*3/uL (ref 1.7–7.7)
Neutrophils Relative %: 56 %
Platelets: 186 10*3/uL (ref 150–400)
RBC: 3.91 MIL/uL — ABNORMAL LOW (ref 4.22–5.81)
RDW: 14 % (ref 11.5–15.5)
WBC: 8.7 10*3/uL (ref 4.0–10.5)
nRBC: 0 % (ref 0.0–0.2)

## 2021-06-04 LAB — BASIC METABOLIC PANEL
Anion gap: 8 (ref 5–15)
BUN: 13 mg/dL (ref 8–23)
CO2: 26 mmol/L (ref 22–32)
Calcium: 9.3 mg/dL (ref 8.9–10.3)
Chloride: 106 mmol/L (ref 98–111)
Creatinine, Ser: 1.42 mg/dL — ABNORMAL HIGH (ref 0.61–1.24)
GFR, Estimated: 53 mL/min — ABNORMAL LOW (ref >60)
Glucose, Bld: 98 mg/dL (ref 70–99)
Potassium: 4.3 mmol/L (ref 3.5–5.1)
Sodium: 140 mmol/L (ref 135–145)

## 2021-06-04 LAB — RESP PANEL BY RT-PCR (FLU A&B, COVID) ARPGX2
Influenza A by PCR: NEGATIVE
Influenza B by PCR: NEGATIVE
SARS Coronavirus 2 by RT PCR: NEGATIVE

## 2021-06-04 LAB — BRAIN NATRIURETIC PEPTIDE: B Natriuretic Peptide: 20.2 pg/mL (ref 0.0–100.0)

## 2021-06-04 LAB — TROPONIN I (HIGH SENSITIVITY): Troponin I (High Sensitivity): 2 ng/L (ref <18)

## 2021-06-04 MED ORDER — ENOXAPARIN SODIUM 60 MG/0.6ML IJ SOSY
1.0000 mg/kg | PREFILLED_SYRINGE | Freq: Once | INTRAMUSCULAR | Status: AC
  Administered 2021-06-04: 55 mg via SUBCUTANEOUS
  Filled 2021-06-04: qty 0.55

## 2021-06-04 MED ORDER — MIRTAZAPINE 15 MG PO TABS
15.0000 mg | ORAL_TABLET | Freq: Every day | ORAL | Status: DC
  Administered 2021-06-04 – 2021-06-12 (×8): 15 mg via ORAL
  Filled 2021-06-04 (×2): qty 1
  Filled 2021-06-04: qty 2
  Filled 2021-06-04 (×6): qty 1

## 2021-06-04 MED ORDER — ALUM & MAG HYDROXIDE-SIMETH 200-200-20 MG/5ML PO SUSP
30.0000 mL | Freq: Once | ORAL | Status: AC
  Administered 2021-06-04: 30 mL via ORAL
  Filled 2021-06-04: qty 30

## 2021-06-04 MED ORDER — CYCLOSPORINE 0.05 % OP EMUL
1.0000 [drp] | Freq: Two times a day (BID) | OPHTHALMIC | Status: DC
  Administered 2021-06-04 – 2021-06-13 (×18): 1 [drp] via OPHTHALMIC
  Filled 2021-06-04 (×19): qty 1

## 2021-06-04 MED ORDER — ALBUTEROL SULFATE (2.5 MG/3ML) 0.083% IN NEBU
2.5000 mg | INHALATION_SOLUTION | RESPIRATORY_TRACT | Status: DC | PRN
  Administered 2021-06-05 – 2021-06-09 (×2): 2.5 mg via RESPIRATORY_TRACT
  Filled 2021-06-04 (×2): qty 3

## 2021-06-04 MED ORDER — LATANOPROST 0.005 % OP SOLN
1.0000 [drp] | Freq: Every day | OPHTHALMIC | Status: DC
  Administered 2021-06-04 – 2021-06-12 (×9): 1 [drp] via OPHTHALMIC
  Filled 2021-06-04: qty 2.5

## 2021-06-04 MED ORDER — HEPARIN (PORCINE) 25000 UT/250ML-% IV SOLN
800.0000 [IU]/h | INTRAVENOUS | Status: DC
  Administered 2021-06-05: 900 [IU]/h via INTRAVENOUS
  Filled 2021-06-04 (×3): qty 250

## 2021-06-04 MED ORDER — ACETAMINOPHEN 650 MG RE SUPP: 650.0000 mg | Freq: Four times a day (QID) | RECTAL | Status: DC | PRN

## 2021-06-04 MED ORDER — METOCLOPRAMIDE HCL 5 MG/ML IJ SOLN
10.0000 mg | Freq: Once | INTRAMUSCULAR | Status: AC
  Administered 2021-06-04: 10 mg via INTRAVENOUS
  Filled 2021-06-04: qty 2

## 2021-06-04 MED ORDER — DIPHENHYDRAMINE HCL 50 MG/ML IJ SOLN
12.5000 mg | Freq: Once | INTRAMUSCULAR | Status: AC
  Administered 2021-06-04: 12.5 mg via INTRAVENOUS
  Filled 2021-06-04: qty 1

## 2021-06-04 MED ORDER — IOHEXOL 350 MG/ML SOLN
100.0000 mL | Freq: Once | INTRAVENOUS | Status: AC | PRN
  Administered 2021-06-04: 100 mL via INTRAVENOUS

## 2021-06-04 MED ORDER — FENTANYL CITRATE (PF) 100 MCG/2ML IJ SOLN
25.0000 ug | Freq: Once | INTRAMUSCULAR | Status: AC
  Administered 2021-06-04: 25 ug via INTRAVENOUS
  Filled 2021-06-04: qty 2

## 2021-06-04 MED ORDER — PANTOPRAZOLE SODIUM 40 MG IV SOLR
40.0000 mg | Freq: Every day | INTRAVENOUS | Status: DC
  Administered 2021-06-04: 40 mg via INTRAVENOUS
  Filled 2021-06-04 (×2): qty 40

## 2021-06-04 MED ORDER — SODIUM CHLORIDE (PF) 0.9 % IJ SOLN
INTRAMUSCULAR | Status: AC
  Filled 2021-06-04: qty 50

## 2021-06-04 MED ORDER — METOCLOPRAMIDE HCL 5 MG/ML IJ SOLN
5.0000 mg | Freq: Four times a day (QID) | INTRAMUSCULAR | Status: DC
  Administered 2021-06-05 – 2021-06-12 (×29): 5 mg via INTRAVENOUS
  Filled 2021-06-04 (×29): qty 2

## 2021-06-04 MED ORDER — HYDRALAZINE HCL 25 MG PO TABS
25.0000 mg | ORAL_TABLET | Freq: Two times a day (BID) | ORAL | Status: DC
  Administered 2021-06-04 – 2021-06-07 (×6): 25 mg via ORAL
  Filled 2021-06-04 (×6): qty 1

## 2021-06-04 MED ORDER — SODIUM CHLORIDE 0.9 % IV SOLN
6.2500 mg | Freq: Three times a day (TID) | INTRAVENOUS | Status: DC | PRN
  Administered 2021-06-05 – 2021-06-09 (×5): 6.25 mg via INTRAVENOUS
  Filled 2021-06-04 (×11): qty 0.25

## 2021-06-04 MED ORDER — ACETAMINOPHEN 325 MG PO TABS
650.0000 mg | ORAL_TABLET | Freq: Four times a day (QID) | ORAL | Status: DC | PRN
  Administered 2021-06-07 – 2021-06-11 (×5): 650 mg via ORAL
  Filled 2021-06-04 (×5): qty 2

## 2021-06-04 MED ORDER — DORZOLAMIDE HCL-TIMOLOL MAL 2-0.5 % OP SOLN
1.0000 [drp] | Freq: Two times a day (BID) | OPHTHALMIC | Status: DC
  Administered 2021-06-04 – 2021-06-13 (×18): 1 [drp] via OPHTHALMIC
  Filled 2021-06-04: qty 10

## 2021-06-04 MED ORDER — SODIUM CHLORIDE 0.9 % IV SOLN: INTRAVENOUS | Status: DC

## 2021-06-04 MED ORDER — SENNOSIDES-DOCUSATE SODIUM 8.6-50 MG PO TABS
1.0000 | ORAL_TABLET | Freq: Every evening | ORAL | Status: DC | PRN
  Administered 2021-06-08: 1 via ORAL
  Filled 2021-06-04: qty 1

## 2021-06-04 MED ORDER — FAMOTIDINE IN NACL 20-0.9 MG/50ML-% IV SOLN
20.0000 mg | Freq: Once | INTRAVENOUS | Status: AC
  Administered 2021-06-04: 20 mg via INTRAVENOUS
  Filled 2021-06-04: qty 50

## 2021-06-04 MED ORDER — BRIMONIDINE TARTRATE 0.2 % OP SOLN
1.0000 [drp] | Freq: Three times a day (TID) | OPHTHALMIC | Status: DC
  Administered 2021-06-04 – 2021-06-13 (×26): 1 [drp] via OPHTHALMIC
  Filled 2021-06-04: qty 5

## 2021-06-04 MED ORDER — PANTOPRAZOLE SODIUM 20 MG PO TBEC: 20.0000 mg | DELAYED_RELEASE_TABLET | Freq: Every day | ORAL | 0 refills | Status: DC

## 2021-06-04 NOTE — ED Provider Notes (Signed)
[facility]  HOSPITAL-EMERGENCY DEPT Provider Note   CSN: [id number] Arrival date & time: 06/04/21  1202     History Chief Complaint  Patient presents with   Weakness    Craven Crean is a 70 y.o. male.  Pt presents to the ED today with sob and weakness.  Pt said he's had sx for 2 days.  Sx come and go. He is very HOH and is a poor historian.    Pt d/w his family.  The pt's family member said pt has been having hiccups which are causing him to feel sob and unable to sleep.      Past Medical History:  Diagnosis Date   Glaucoma    HOH (hard of hearing)    Hypertension     Patient Active Problem List   Diagnosis Date Noted   Bilateral pulmonary embolism (HCC) 06/04/2021   Pulmonary embolism (HCC) 06/04/2021   Influenza A 11/11/2016   Protein-calorie malnutrition, severe 11/03/2016   Acute kidney injury (HCC)    Iron deficiency anemia    Hypokalemia    Hypomagnesemia    Hypertension 11/02/2016   Sepsis (HCC) 11/02/2016   Dehydration 11/02/2016   Glaucoma 11/02/2016   ARF (acute renal failure) (HCC) 11/02/2016   Anemia 11/02/2016   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    Past Surgical History:  Procedure Laterality Date   ARTHROTOMY Left 07/21/2014   Procedure: ARTHROTOMY; IRRIGATION AND DRAINAGE OF LEFT WRIST INFECTION;  Surgeon: Eldred Manges, MD;  Location: [facility] OR;  Service: Orthopedics;  Laterality: Left;       Family History  Problem Relation Age of Onset   Breast cancer Sister    Cancer Sister    Diabetes Sister    Hypertension Sister    Diabetes Brother    CAD Neg Hx    Stroke Neg Hx     Social History   Tobacco Use   Smoking status: Every Day    Types: Cigarettes   Smokeless tobacco: Never  Vaping Use   Vaping Use: Never used  Substance Use Topics   Alcohol use: No   Drug use: No    Home Medications Prior to Admission medications   Medication Sig Start Date End Date Taking? Authorizing Provider  brimonidine  (ALPHAGAN) 0.2 % ophthalmic solution Place 1 drop into the left eye 3 (three) times daily.   Yes [provider]  dorzolamide-timolol (COSOPT) 22.3-6.8 MG/ML ophthalmic solution Place 1 drop into both eyes 2 (two) times daily. 04/17/21  Yes [provider]  hydrALAZINE (APRESOLINE) 25 MG tablet Take 25 mg by mouth 2 (two) times daily. 02/22/21  Yes [provider]  latanoprost (XALATAN) 0.005 % ophthalmic solution Place 1 drop into both eyes at bedtime.   Yes [provider]  mirtazapine (REMERON) 15 MG tablet Take 15 mg by mouth at bedtime. 04/23/21  Yes [provider]  pantoprazole (PROTONIX) 20 MG tablet Take 1 tablet (20 mg total) by mouth daily. 06/04/21  Yes Jacalyn Lefevre, MD  RESTASIS 0.05 % ophthalmic emulsion Place 1 drop into both eyes 2 (two) times daily. 02/26/21  Yes [provider]  feeding supplement, ENSURE ENLIVE, (ENSURE ENLIVE) LIQD Take 237 mLs by mouth 3 (three) times daily between meals. Patient not taking: Reported on 06/04/2021 11/11/16   Renae Fickle, MD  ondansetron (ZOFRAN) 4 MG tablet Take 1 tablet (4 mg total) by mouth every 8 (eight) hours as needed for nausea or vomiting. Patient not taking: Reported on  06/04/2021 11/11/16   Renae FickleShort, Mackenzie, MD    Allergies    Lactose intolerance (gi)  Review of Systems   Review of Systems  Respiratory:  Positive for shortness of breath.   Neurological:  Positive for weakness.  All other systems reviewed and are negative.  Physical Exam Updated Vital Signs BP (!) 154/72 (BP Location: Left Arm)   Pulse 96   Temp 98.1 F (36.7 C) (Oral)   Resp (!) 24   Ht 5\' 10"  (1.778 m)   Wt 54 kg   SpO2 100%   BMI 17.08 kg/m   Physical Exam Vitals and nursing note reviewed.  Constitutional:      Appearance: Normal appearance.  HENT:     Head: Normocephalic and atraumatic.     Right Ear: External ear normal.     Left Ear: External ear normal.     Nose: Nose normal.      Mouth/Throat:     Mouth: Mucous membranes are moist.     Pharynx: Oropharynx is clear.  Eyes:     Extraocular Movements: Extraocular movements intact.     Conjunctiva/sclera: Conjunctivae normal.  Cardiovascular:     Rate and Rhythm: Normal rate and regular rhythm.     Pulses: Normal pulses.     Heart sounds: Normal heart sounds.  Pulmonary:     Effort: Pulmonary effort is normal.     Breath sounds: Normal breath sounds.  Abdominal:     General: Abdomen is flat. Bowel sounds are normal.     Palpations: Abdomen is soft.  Musculoskeletal:        General: Normal range of motion.     Cervical back: Normal range of motion and neck supple.  Skin:    General: Skin is warm.     Capillary Refill: Capillary refill takes less than 2 seconds.  Neurological:     General: No focal deficit present.     Mental Status: He is alert and oriented to person, place, and time.  Psychiatric:        Mood and Affect: Mood normal.        Behavior: Behavior normal.    ED Results / Procedures / Treatments   Labs (all labs ordered are listed, but only abnormal results are displayed) Labs Reviewed  BASIC METABOLIC PANEL - Abnormal; Notable for the following components:      Result Value   Creatinine, Ser 1.42 (*)    GFR, Estimated 53 (*)    All other components within normal limits  CBC WITH DIFFERENTIAL/PLATELET - Abnormal; Notable for the following components:   RBC 3.91 (*)    Hemoglobin 11.7 (*)    HCT 38.4 (*)    Monocytes Absolute 1.1 (*)    All other components within normal limits  CBC - Abnormal; Notable for the following components:   RBC 3.76 (*)    Hemoglobin 11.2 (*)    HCT 37.2 (*)    All other components within normal limits  BASIC METABOLIC PANEL - Abnormal; Notable for the following components:   Creatinine, Ser 1.43 (*)    Calcium 8.6 (*)    GFR, Estimated 52 (*)    All other components within normal limits  RESP PANEL BY RT-PCR (FLU A&B, COVID) ARPGX2  BRAIN NATRIURETIC  PEPTIDE  HEPARIN LEVEL (UNFRACTIONATED)  CBC  BASIC METABOLIC PANEL  HEPARIN LEVEL (UNFRACTIONATED)  TROPONIN I (HIGH SENSITIVITY)    EKG EKG Interpretation  Date/Time:  Tuesday June 04 2021 13:00:43 EDT Ventricular Rate:  69 PR Interval:  166 QRS Duration: 82 QT Interval:  382 QTC Calculation: 410 R Axis:   85 Text Interpretation: Sinus rhythm Borderline right axis deviation Nonspecific T abnrm, anterolateral leads ST elevation, consider inferior injury Artifact in lead(s) I II III aVR aVL aVF V5 V6 No significant change since last tracing Confirmed by Jacalyn Lefevre [phone]) on 06/04/2021 1:03:16 PM  Radiology CT Angio Chest PE W and/or Wo Contrast  Result Date: 06/04/2021 CLINICAL DATA:  Shortness of breath. Difficulty following commands or giving history. Suspected pulmonary embolus. EXAM: CT ANGIOGRAPHY CHEST WITH CONTRAST TECHNIQUE: Multidetector CT imaging of the chest was performed using the standard protocol during bolus administration of intravenous contrast. Multiplanar CT image reconstructions and MIPs were obtained to evaluate the vascular anatomy. CONTRAST:  OMNIPAQUE IOHEXOL 350 MG/ML SOLN COMPARISON:  None. FINDINGS: Cardiovascular: Satisfactory opacification of the pulmonary arteries to the segmental level. Markedly limited evaluation for pulmonary embolus due to motion artifact. Filling defects within the right lower, left lower, and left upper lobe segmental pulmonary arteries consistent with emboli. The main pulmonary artery is normal in caliber. No central pulmonary embolus. Normal heart size. No pericardial effusion. Mediastinum/Nodes: No enlarged mediastinal, hilar, or axillary lymph nodes. Thyroid gland, trachea, and esophagus demonstrate no significant findings. Lungs/Pleura: Centrilobular emphysematous changes. Trace biapical pleural/pulmonary scarring. Bibasilar atelectasis. No focal consolidation. No pulmonary nodule. No pulmonary mass. No pleural effusion. No  pneumothorax. Upper Abdomen: There is a 2.6 cm fluid density lesion within the left kidney that likely represents a simple renal cyst. Otherwise no acute abnormality. Musculoskeletal: No chest wall abnormality. No suspicious lytic or blastic osseous lesions. No acute displaced fracture with limited evaluation due to motion artifact. Multilevel degenerative changes of the spine. Review of the MIP images confirms the above findings. IMPRESSION: 1. Right lower, left lower, left upper lobe segmental pulmonary emboli. No associated right heart strain or pulmonary infarction. Markedly limited evaluation due to motion artifact. 2. Aortic Atherosclerosis (ICD10-I70.0) and Emphysema (ICD10-J43.9). These results were called by telephone at the time of interpretation on 06/04/2021 at 5:17 pm to provider Dr. Biagio Borg, who verbally acknowledged these results. Electronically Signed   By: Tish Frederickson M.D.   On: 06/04/2021 17:19   DG Chest Port 1 View  Result Date: 06/04/2021 CLINICAL DATA:  Shortness of breath. EXAM: PORTABLE CHEST 1 VIEW COMPARISON:  11/02/2016. FINDINGS: Mediastinum hilar structures normal. Lungs are clear. No pleural effusion or pneumothorax. Tiny calcified pulmonary nodules consistent granulomas. No acute bony abnormality. IMPRESSION: No acute cardiopulmonary disease. Electronically Signed   By: Maisie Fus  Register   On: 06/04/2021 13:18   ECHOCARDIOGRAM COMPLETE  Result Date: 06/05/2021    ECHOCARDIOGRAM REPORT   Patient Name:   RAJAT STAVER Date of Exam: 06/05/2021 Medical Rec #:  [id number]     Height:       70.0 in Accession #:    [id number]    Weight:       119.0 lb Date of Birth:  Apr 08, 1951      BSA:          1.674 m Patient Age:    69 years      BP:           146/60 mmHg Patient Gender: M             HR:           92 bpm. Exam Location:  Inpatient Procedure: 2D Echo, Cardiac Doppler and Color Doppler Indications:    Pulmonary Embolus I26.09  History:        Patient has no prior history of  Echocardiogram examinations.                 Risk Factors:Hypertension.  Sonographer:    Eulah Pont RDCS Referring Phys: [id number] RAMESH KC IMPRESSIONS  1. Left ventricular ejection fraction, by estimation, is 55%. The left ventricle has normal function. The left ventricle has no regional wall motion abnormalities. Left ventricular diastolic parameters are consistent with Grade I diastolic dysfunction (impaired relaxation).  2. Right ventricular systolic function is normal. The right ventricular size is normal. Tricuspid regurgitation signal is inadequate for assessing PA pressure.  3. The mitral valve is normal in structure. No evidence of mitral valve regurgitation. No evidence of mitral stenosis.  4. The aortic valve is tricuspid. Aortic valve regurgitation is not visualized. No aortic stenosis is present.  5. The inferior vena cava is normal in size with greater than 50% respiratory variability, suggesting right atrial pressure of 3 mmHg. FINDINGS  Left Ventricle: Left ventricular ejection fraction, by estimation, is 55%. The left ventricle has normal function. The left ventricle has no regional wall motion abnormalities. The left ventricular internal cavity size was normal in size. There is no left ventricular hypertrophy. Left ventricular diastolic parameters are consistent with Grade I diastolic dysfunction (impaired relaxation). Right Ventricle: The right ventricular size is normal. No increase in right ventricular wall thickness. Right ventricular systolic function is normal. Tricuspid regurgitation signal is inadequate for assessing PA pressure. Left Atrium: Left atrial size was normal in size. Right Atrium: Right atrial size was normal in size. Pericardium: There is no evidence of pericardial effusion. Mitral Valve: The mitral valve is normal in structure. No evidence of mitral valve regurgitation. No evidence of mitral valve stenosis. Tricuspid Valve: The tricuspid valve is normal in structure.  Tricuspid valve regurgitation is not demonstrated. Aortic Valve: The aortic valve is tricuspid. Aortic valve regurgitation is not visualized. No aortic stenosis is present. Pulmonic Valve: The pulmonic valve was normal in structure. Pulmonic valve regurgitation is not visualized. Aorta: The aortic root is normal in size and structure. Venous: The inferior vena cava is normal in size with greater than 50% respiratory variability, suggesting right atrial pressure of 3 mmHg. IAS/Shunts: No atrial level shunt detected by color flow Doppler.  LEFT VENTRICLE PLAX 2D LVIDd:         3.53 cm  Diastology LVIDs:         2.27 cm  LV e' medial:    8.06 cm/s LV PW:         0.77 cm  LV E/e' medial:  6.3 LV IVS:        0.75 cm  LV e' lateral:   9.99 cm/s LVOT diam:     2.20 cm  LV E/e' lateral: 5.1 LV SV:         55 LV SV Index:   33 LVOT Area:     3.80 cm  RIGHT VENTRICLE RV S prime:     11.70 cm/s TAPSE (M-mode): 1.6 cm LEFT ATRIUM             Index       RIGHT ATRIUM           Index LA diam:        1.80 cm 1.08 cm/m  RA Area:     10.00 cm LA Vol (A2C):   28.1 ml 16.78 ml/m RA Volume:   21.00 ml  12.54 ml/m LA Vol (  A4C):   23.5 ml 14.04 ml/m LA Biplane Vol: 26.0 ml 15.53 ml/m  AORTIC VALVE LVOT Vmax:   69.00 cm/s LVOT Vmean:  46.200 cm/s LVOT VTI:    0.144 m  AORTA Ao Root diam: 3.20 cm Ao Asc diam:  3.00 cm MITRAL VALVE MV Area (PHT): 3.42 cm    SHUNTS MV Decel Time: 222 msec    Systemic VTI:  0.14 m MV E velocity: 50.60 cm/s  Systemic Diam: 2.20 cm MV A velocity: 61.40 cm/s MV E/A ratio:  0.82 Marca Ancona MD Electronically signed by Marca Ancona MD Signature Date/Time: 06/05/2021/3:53:31 PM    Final    VAS [place] LOWER EXTREMITY VENOUS (DVT) (ONLY [facility] & [facility] 7a-7p)  Result Date: 06/05/2021  Lower Venous DVT Study Patient Name:  CARTRELL BENTSEN  Date of Exam:   06/05/2021 Medical Rec #: [id number]      Accession #:    [id number] Date of Birth: May 01, 1951       Patient Gender: M Patient Age:   69Y Exam Location:  [facility] Procedure:      VAS [place] LOWER EXTREMITY VENOUS (DVT) Referring Phys: [id number] Koleen Distance --------------------------------------------------------------------------------  Indications: Pulmonary embolism.  Risk Factors: Confirmed PE. Limitations: Poor ultrasound/tissue interface and Patient positioning. Comparison Study: No prior studies. Performing Technologist: Chanda Busing RVT  Examination Guidelines: A complete evaluation includes B-mode imaging, spectral Doppler, color Doppler, and power Doppler as needed of all accessible portions of each vessel. Bilateral testing is considered an integral part of a complete examination. Limited examinations for reoccurring indications may be performed as noted. The reflux portion of the exam is performed with the patient in reverse Trendelenburg.  +---------+---------------+---------+-----------+----------+--------------+ RIGHT    CompressibilityPhasicitySpontaneityPropertiesThrombus Aging +---------+---------------+---------+-----------+----------+--------------+ CFV      Full           Yes      Yes                                 +---------+---------------+---------+-----------+----------+--------------+ SFJ      Full                                                        +---------+---------------+---------+-----------+----------+--------------+ FV Prox  Full                                                        +---------+---------------+---------+-----------+----------+--------------+ FV Mid   Full                                                        +---------+---------------+---------+-----------+----------+--------------+ FV DistalFull                                                        +---------+---------------+---------+-----------+----------+--------------+ PFV  Full                                                         +---------+---------------+---------+-----------+----------+--------------+ POP      Full           Yes      Yes                                 +---------+---------------+---------+-----------+----------+--------------+ PTV      Full                                                        +---------+---------------+---------+-----------+----------+--------------+ PERO     Full                                                        +---------+---------------+---------+-----------+----------+--------------+   +---------+---------------+---------+-----------+----------+--------------+ LEFT     CompressibilityPhasicitySpontaneityPropertiesThrombus Aging +---------+---------------+---------+-----------+----------+--------------+ CFV      Full           Yes      Yes                                 +---------+---------------+---------+-----------+----------+--------------+ SFJ      Full                                                        +---------+---------------+---------+-----------+----------+--------------+ FV Prox  Full                                                        +---------+---------------+---------+-----------+----------+--------------+ FV Mid   Full                                                        +---------+---------------+---------+-----------+----------+--------------+ FV DistalNone           No       No                   Acute          +---------+---------------+---------+-----------+----------+--------------+ PFV      Full                                                        +---------+---------------+---------+-----------+----------+--------------+  POP      None           No       No                   Acute          +---------+---------------+---------+-----------+----------+--------------+ PTV      Partial                                      Acute           +---------+---------------+---------+-----------+----------+--------------+ PERO     Partial                                      Acute          +---------+---------------+---------+-----------+----------+--------------+ Gastroc  Full                                                        +---------+---------------+---------+-----------+----------+--------------+     Summary: RIGHT: - No evidence of common femoral vein obstruction. - No cystic structure found in the popliteal fossa.  LEFT: - Findings consistent with acute deep vein thrombosis involving the left femoral vein, left popliteal vein, left posterior tibial veins, and left peroneal veins. - No cystic structure found in the popliteal fossa.  *See table(s) above for measurements and observations. Electronically signed by Lemar Livings MD on 06/05/2021 at 1:49:06 PM.    Final     Procedures Procedures   Medications Ordered in ED Medications  sodium chloride (PF) 0.9 % injection (has no administration in time range)  acetaminophen (TYLENOL) tablet 650 mg (has no administration in time range)    Or  acetaminophen (TYLENOL) suppository 650 mg (has no administration in time range)  senna-docusate (Senokot-S) tablet 1 tablet (has no administration in time range)  albuterol (PROVENTIL) (2.5 MG/3ML) 0.083% nebulizer solution 2.5 mg (2.5 mg Nebulization Given 06/05/21 0352)  brimonidine (ALPHAGAN) 0.2 % ophthalmic solution 1 drop (1 drop Left Eye Given 06/05/21 1432)  dorzolamide-timolol (COSOPT) 22.3-6.8 MG/ML ophthalmic solution 1 drop (1 drop Both Eyes Given 06/05/21 1000)  hydrALAZINE (APRESOLINE) tablet 25 mg (25 mg Oral Given 06/05/21 0958)  latanoprost (XALATAN) 0.005 % ophthalmic solution 1 drop (1 drop Both Eyes Given 06/04/21 2238)  mirtazapine (REMERON) tablet 15 mg (15 mg Oral Given 06/04/21 2234)  cycloSPORINE (RESTASIS) 0.05 % ophthalmic emulsion 1 drop (1 drop Both Eyes Given 06/05/21 1000)  promethazine (PHENERGAN) 6.25 mg in  sodium chloride 0.9 % 50 mL IVPB (6.25 mg Intravenous New Bag/Given 06/05/21 1734)  metoCLOPramide (REGLAN) injection 5 mg (5 mg Intravenous Given 06/05/21 1654)  heparin ADULT infusion 100 units/mL (25000 units/21mL) (900 Units/hr Intravenous New Bag/Given 06/05/21 0546)  pantoprazole (PROTONIX) EC tablet 40 mg (40 mg Oral Given 06/05/21 1111)  feeding supplement (ENSURE ENLIVE / ENSURE PLUS) liquid 237 mL (237 mLs Oral Given 06/05/21 1431)  multivitamin with minerals tablet 1 tablet (1 tablet Oral Given 06/05/21 1431)  alum & mag hydroxide-simeth (MAALOX/MYLANTA) 200-200-20 MG/5ML suspension 30 mL (30 mLs Oral Given 06/04/21 1325)  metoCLOPramide (REGLAN) injection 10 mg (10 mg Intravenous Given 06/04/21 1718)  diphenhydrAMINE (BENADRYL) injection 12.5 mg (  12.5 mg Intravenous Given 06/04/21 1719)  famotidine (PEPCID) IVPB 20 mg premix (0 mg Intravenous Stopped 06/04/21 1754)  iohexol (OMNIPAQUE) 350 MG/ML injection 100 mL (100 mLs Intravenous Contrast Given 06/04/21 1640)  enoxaparin (LOVENOX) injection 55 mg (55 mg Subcutaneous Given 06/04/21 1750)  fentaNYL (SUBLIMAZE) injection 25 mcg (25 mcg Intravenous Given 06/04/21 1802)    ED Course  I have reviewed the triage vital signs and the nursing notes.  Pertinent labs & imaging results that were available during my care of the patient were reviewed by me and considered in my medical decision making (see chart for details).    MDM Rules/Calculators/A&P                          Initially, the Maalox stopped the hiccups.  However, they came back.  I ordered more meds for sx treatment and ordered a CT chest to r/o PE to make sure there was not anything there causing the hiccups.  Pt signed out to Dr. Delford Field at shift change.  Final Clinical Impression(s) / ED Diagnoses Final diagnoses:  Multiple subsegmental pulmonary emboli without acute cor pulmonale (HCC)  Hiccups    Rx / DC Orders ED Discharge Orders          Ordered    pantoprazole  (PROTONIX) 20 MG tablet  Daily        06/04/21 1609             Jacalyn Lefevre, MD 06/05/21 1737

## 2021-06-04 NOTE — ED Provider Notes (Signed)
  Physical Exam  BP (!) 155/94   Pulse 88   Temp 97.9 F (36.6 C) (Oral)   Resp 20   Ht 5\' 10"  (1.778 m)   Wt 54 kg   SpO2 100%   BMI 17.08 kg/m   Physical Exam  ED Course/Procedures     Procedures  MDM  presented with weakness, shortness of breath, and hiccups.  He was noted to be tachypneic but not hypoxic.  In the course of his work-up for hiccups, it was determined he has multiple, bilateral PEs.  When I saw him, he was riding on the bed and was complaining of dyspnea and chest pain.  Respiratory rate was in the 20s.  On 2 L of oxygen, he had a respiratory rate of approximately 16 and was much more comfortable.  He will be admitted to the hospital for observation.  I have written for a dose of Lovenox.       Ulyses Amor, MD 06/04/21 [phone]

## 2021-06-04 NOTE — H&P (Signed)
History and Physical    Edwin Wright [id number]:[id number] DOB: Aug 18, 1951 DOA: 06/04/2021  PCP: [facility]   Patient coming from:   Chief Complaint  Patient presents with   Weakness   HPI: Edwin Wright is a 70 y.o. male with medical history significant for glaucoma, hypertension, hard of hearing came to the ED with complaint of having hiccups which is causing him to feel short of breath and unable to sleep for last 2 days. Patient is poor historian, hard of hearing- he is able to tel me his name and reports he is having hiccups and it it is making his breathing harder, he denied any pain. There is no report of fever chills nausea vomiting. I tried to call his contact his sister- no answer.  ED Course: Blood pressure heart rate fairly stable but was tachypneic and noted to be short of breath, lab work showed AKI creatinine 1.4 BNP normal, chest x-ray no acute finding.  Patient was noted to be tachypneic short of breath ED doctor got concerned and ordered CT angio chest that showed a right lower, left lower and left upper lobe segmental pulmonary emboli no right heart strain or infarction noted. Patient was placed on supplemental oxygen to help with breathing, but was not hypoxic.  Patient was placed on Lovenox and admission was requested.  Review of Systems: unable to review all system as he is HOH, poor historial  Past Medical History:  Diagnosis Date   Glaucoma    HOH (hard of hearing)    Hypertension     Past Surgical History:  Procedure Laterality Date   ARTHROTOMY Left 07/21/2014   Procedure: ARTHROTOMY; IRRIGATION AND DRAINAGE OF LEFT WRIST INFECTION;  Surgeon: Eldred Manges, MD;  Location: [facility] OR;  Service: Orthopedics;  Laterality: Left;     reports that he has been smoking cigarettes. He has never used smokeless tobacco. He reports that he does not drink alcohol and does not use drugs.  Allergies  Allergen Reactions   Lactose Intolerance (Gi)     UNK reaction     Family History  Problem Relation Age of Onset   Breast cancer Sister    Cancer Sister    Diabetes Sister    Hypertension Sister    Diabetes Brother    CAD Neg Hx    Stroke Neg Hx      Prior to Admission medications   Medication Sig Start Date End Date Taking? Authorizing Provider  brimonidine (ALPHAGAN) 0.2 % ophthalmic solution Place 1 drop into the left eye 3 (three) times daily.   Yes [provider]  dorzolamide-timolol (COSOPT) 22.3-6.8 MG/ML ophthalmic solution Place 1 drop into both eyes 2 (two) times daily. 04/17/21  Yes [provider]  hydrALAZINE (APRESOLINE) 25 MG tablet Take 25 mg by mouth 2 (two) times daily. 02/22/21  Yes [provider]  latanoprost (XALATAN) 0.005 % ophthalmic solution Place 1 drop into both eyes at bedtime.   Yes [provider]  mirtazapine (REMERON) 15 MG tablet Take 15 mg by mouth at bedtime. 04/23/21  Yes [provider]  pantoprazole (PROTONIX) 20 MG tablet Take 1 tablet (20 mg total) by mouth daily. 06/04/21  Yes Jacalyn Lefevre, MD  RESTASIS 0.05 % ophthalmic emulsion Place 1 drop into both eyes 2 (two) times daily. 02/26/21  Yes [provider]  feeding supplement, ENSURE ENLIVE, (ENSURE ENLIVE) LIQD Take 237 mLs by mouth 3 (three) times daily between meals. Patient not taking: Reported on 06/04/2021 11/11/16  Renae Fickle, MD  ondansetron (ZOFRAN) 4 MG tablet Take 1 tablet (4 mg total) by mouth every 8 (eight) hours as needed for nausea or vomiting. Patient not taking: Reported on 06/04/2021 11/11/16   Renae Fickle, MD    Physical Exam: Vitals:   06/04/21 1430 06/04/21 1530 06/04/21 1630 06/04/21 1730  BP: 119/65 136/87 (!) 153/77 (!) 155/94  Pulse: 69 75 67 88  Resp: 20 (!) 26 (!) 25 20  Temp:      TempSrc:      SpO2: 99% 99% 98% 100%  Weight:      Height:        General exam: AA, having hiccups, thin,frail,weak appearing. HEENT:Oral mucosa moist, Ear/Nose WNL grossly,  dentition normal. Respiratory system: bilaterally  clear,no wheezing or crackles,no use of accessory muscle Cardiovascular system: S1 & S2 +, No JVD,. Gastrointestinal system: Abdomen soft, NT,ND, BS+ Nervous System:Alert, awake, moving extremities and grossly nonfocal Extremities: No edema, distal peripheral pulses palpable.  Skin: No rashes,no icterus. MSK: Normal muscle bulk,tone, power   Labs on Admission: I have personally reviewed following labs and imaging studies  CBC: Recent Labs  Lab 06/04/21 1318  WBC 8.7  NEUTROABS 5.1  HGB 11.7*  HCT 38.4*  MCV 98.2  PLT 186   Basic Metabolic Panel: Recent Labs  Lab 06/04/21 1318  NA 140  K 4.3  CL 106  CO2 26  GLUCOSE 98  BUN 13  CREATININE 1.42*  CALCIUM 9.3   GFR: Estimated Creatinine Clearance: 36.4 mL/min (A) (by C-G formula based on SCr of 1.42 mg/dL (H)). Liver Function Tests: No results for input(s): AST, ALT, ALKPHOS, BILITOT, PROT, ALBUMIN in the last 168 hours. No results for input(s): LIPASE, AMYLASE in the last 168 hours. No results for input(s): AMMONIA in the last 168 hours. Coagulation Profile: No results for input(s): INR, PROTIME in the last 168 hours. Cardiac Enzymes: No results for input(s): CKTOTAL, CKMB, CKMBINDEX, TROPONINI in the last 168 hours. BNP (last 3 results) No results for input(s): PROBNP in the last 8760 hours. HbA1C: No results for input(s): HGBA1C in the last 72 hours. CBG: No results for input(s): GLUCAP in the last 168 hours. Lipid Profile: No results for input(s): CHOL, HDL, LDLCALC, TRIG, CHOLHDL, LDLDIRECT in the last 72 hours. Thyroid Function Tests: No results for input(s): TSH, T4TOTAL, FREET4, T3FREE, THYROIDAB in the last 72 hours. Anemia Panel: No results for input(s): VITAMINB12, FOLATE, FERRITIN, TIBC, IRON, RETICCTPCT in the last 72 hours. Urine analysis:    Component Value Date/Time   COLORURINE YELLOW 11/02/2016 1702   APPEARANCEUR HAZY (A) 11/02/2016 1702    LABSPEC 1.018 11/02/2016 1702   PHURINE 5.0 11/02/2016 1702   GLUCOSEU NEGATIVE 11/02/2016 1702   HGBUR MODERATE (A) 11/02/2016 1702   BILIRUBINUR NEGATIVE 11/02/2016 1702   KETONESUR 5 (A) 11/02/2016 1702   PROTEINUR 30 (A) 11/02/2016 1702   NITRITE NEGATIVE 11/02/2016 1702   LEUKOCYTESUR NEGATIVE 11/02/2016 1702    Radiological Exams on Admission: CT Angio Chest PE W and/or Wo Contrast  Result Date: 06/04/2021 CLINICAL DATA:  Shortness of breath. Difficulty following commands or giving history. Suspected pulmonary embolus. EXAM: CT ANGIOGRAPHY CHEST WITH CONTRAST TECHNIQUE: Multidetector CT imaging of the chest was performed using the standard protocol during bolus administration of intravenous contrast. Multiplanar CT image reconstructions and MIPs were obtained to evaluate the vascular anatomy. CONTRAST:  OMNIPAQUE IOHEXOL 350 MG/ML SOLN COMPARISON:  None. FINDINGS: Cardiovascular: Satisfactory opacification of the pulmonary arteries to the segmental level. Markedly  limited evaluation for pulmonary embolus due to motion artifact. Filling defects within the right lower, left lower, and left upper lobe segmental pulmonary arteries consistent with emboli. The main pulmonary artery is normal in caliber. No central pulmonary embolus. Normal heart size. No pericardial effusion. Mediastinum/Nodes: No enlarged mediastinal, hilar, or axillary lymph nodes. Thyroid gland, trachea, and esophagus demonstrate no significant findings. Lungs/Pleura: Centrilobular emphysematous changes. Trace biapical pleural/pulmonary scarring. Bibasilar atelectasis. No focal consolidation. No pulmonary nodule. No pulmonary mass. No pleural effusion. No pneumothorax. Upper Abdomen: There is a 2.6 cm fluid density lesion within the left kidney that likely represents a simple renal cyst. Otherwise no acute abnormality. Musculoskeletal: No chest wall abnormality. No suspicious lytic or blastic osseous lesions. No acute  displaced fracture with limited evaluation due to motion artifact. Multilevel degenerative changes of the spine. Review of the MIP images confirms the above findings. IMPRESSION: 1. Right lower, left lower, left upper lobe segmental pulmonary emboli. No associated right heart strain or pulmonary infarction. Markedly limited evaluation due to motion artifact. 2. Aortic Atherosclerosis (ICD10-I70.0) and Emphysema (ICD10-J43.9). These results were called by telephone at the time of interpretation on 06/04/2021 at 5:17 pm to provider Dr. Biagio Borg, who verbally acknowledged these results. Electronically Signed   By: Tish Frederickson M.D.   On: 06/04/2021 17:19   DG Chest Port 1 View  Result Date: 06/04/2021 CLINICAL DATA:  Shortness of breath. EXAM: PORTABLE CHEST 1 VIEW COMPARISON:  11/02/2016. FINDINGS: Mediastinum hilar structures normal. Lungs are clear. No pleural effusion or pneumothorax. Tiny calcified pulmonary nodules consistent granulomas. No acute bony abnormality. IMPRESSION: No acute cardiopulmonary disease. Electronically Signed   By: Maisie Fus  Register   On: 06/04/2021 13:18     Assessment/Plan Bilateral segmental pulmonary embolism : We will admit the patient continue heparin gtt- pharmacy consulted.  Check duplex, echocardiogram.  Troponin pending, not hypoxic but tachypneic needing supplemental oxygen.    Hiccups- persistent- 2/2 above. Add phenergan prn,  q6h reglan, protonix, pain control/tylenol  Hypertension: Stable resume home meds  ARF: Started gentle IV hydration   Low BMI 17 dietitian consult  Body mass index is 17.08 kg/m.   Severity of Illness: Admit under observation status due to acute PE  DVT prophylaxis:  lovenox Code Status:   Code Status: Full Code  Family Communication: Admission, patients condition and plan of care including tests being ordered have been discussed with the patient ( unable to reach family)  who indicate understanding and agree with the plan and Code  Status.  Consults called:  none Lanae Boast MD [facility]  If 7PM-7AM, please contact night-coverage [email or web address]  06/04/2021, 6:32 PM

## 2021-06-04 NOTE — Progress Notes (Signed)
Attempted to do nursing admission history. Pt is having hiccups and is short of breath. He is very hard of hearing and unable to answer questions at time of nursing admission history. Information obtained from chart and computer notes. Briscoe Burns BSN, RN-BC Admissions RN 06/04/2021 6:29 PM

## 2021-06-04 NOTE — ED Triage Notes (Signed)
Patient reports weakness x2 days with shob. Lung sounds clear, ems vitals: Spo2:99%RA Respiration: 24

## 2021-06-04 NOTE — Progress Notes (Signed)
ANTICOAGULATION CONSULT NOTE - Initial Consult  Pharmacy Consult for IV heparin Indication: pulmonary embolus  Allergies  Allergen Reactions   Lactose Intolerance (Gi)     UNK reaction    Patient Measurements: Height: 5\' 10"  (177.8 cm) Weight: 54 kg (119 lb 0.8 oz) IBW/kg (Calculated) : 73 Heparin Dosing Weight:   Vital Signs: Temp: 97.9 F (36.6 C) (07/19 1301) Temp Source: Oral (07/19 1301) BP: 155/94 (07/19 1730) Pulse Rate: 88 (07/19 1730)  Labs: Recent Labs    06/04/21 1318  HGB 11.7*  HCT 38.4*  PLT 186  CREATININE 1.42*    Estimated Creatinine Clearance: 36.4 mL/min (A) (by C-G formula based on SCr of 1.42 mg/dL (H)).   Medical History: Past Medical History:  Diagnosis Date   Glaucoma    HOH (hard of hearing)    Hypertension     Medications:  Scheduled:   brimonidine  1 drop Left Eye TID   cycloSPORINE  1 drop Both Eyes BID   dorzolamide-timolol  1 drop Both Eyes BID   hydrALAZINE  25 mg Oral BID   latanoprost  1 drop Both Eyes QHS   [START ON 06/05/2021] metoCLOPramide (REGLAN) injection  5 mg Intravenous Q6H   mirtazapine  15 mg Oral QHS   pantoprazole (PROTONIX) IV  40 mg Intravenous Q24H   sodium chloride (PF)        Assessment: Pharmacy is consulted to dose heparin in 70 yo male diagnosed with bilateral PE. No noted anticoagulation on home med list. CT of chest shows Right lower, left lower, left upper lobe segmental pulmonaryemboli. No associated right heart strain or pulmonary infarction.  Today, 06/04/21 Hgb 11.7, plt 186 SCr 1.43 mg/dl, CrCl 36 ml/min Pt received enoxaparin 55 mg x 1 in ED    Goal of Therapy:  Heparin level 0.3-0.7 units/ml Monitor platelets by anticoagulation protocol: Yes   Plan:  Starting 12 hours after enoxaparin dose, start heparin 900 units/hr  Obtain HL 8 hours after start of infusion Daily CBC while on heparin Monitor for signs and symptoms of bleeding   06/06/21, PharmD, BCPS 06/04/2021 6:59  PM

## 2021-06-04 NOTE — Discharge Instructions (Addendum)
Information on my medicine - ELIQUIS (apixaban)  Why was Eliquis prescribed for you? Eliquis was prescribed to treat blood clots that may have been found in the veins of your legs (deep vein thrombosis) or in your lungs (pulmonary embolism) and to reduce the risk of them occurring again.  What do You need to know about Eliquis ? The starting dose is 10 mg (two 5 mg tablets) taken TWICE daily for the FIRST SEVEN (7) DAYS, then on (enter date)  06/13/21  the dose is reduced to ONE 5 mg tablet taken TWICE daily.  Eliquis may be taken with or without food.   Try to take the dose about the same time in the morning and in the evening. If you have difficulty swallowing the tablet whole please discuss with your pharmacist how to take the medication safely.  Take Eliquis exactly as prescribed and DO NOT stop taking Eliquis without talking to the doctor who prescribed the medication.  Stopping may increase your risk of developing a new blood clot.  Refill your prescription before you run out.  After discharge, you should have regular check-up appointments with your healthcare provider that is prescribing your Eliquis.    What do you do if you miss a dose? If a dose of ELIQUIS is not taken at the scheduled time, take it as soon as possible on the same day and twice-daily administration should be resumed. The dose should not be doubled to make up for a missed dose.  Important Safety Information A possible side effect of Eliquis is bleeding. You should call your healthcare provider right away if you experience any of the following: Bleeding from an injury or your nose that does not stop. Unusual colored urine (red or dark brown) or unusual colored stools (red or black). Unusual bruising for unknown reasons. A serious fall or if you hit your head (even if there is no bleeding).  Some medicines may interact with Eliquis and might increase your risk of bleeding or clotting while on Eliquis. To  help avoid this, consult your healthcare provider or pharmacist prior to using any new prescription or non-prescription medications, including herbals, vitamins, non-steroidal anti-inflammatory drugs (NSAIDs) and supplements.  This website has more information on Eliquis (apixaban): http://[email or web address].com/eliquis/home

## 2021-06-05 ENCOUNTER — Observation Stay (HOSPITAL_COMMUNITY): Payer: Medicare Other

## 2021-06-05 DIAGNOSIS — I10 Essential (primary) hypertension: Secondary | ICD-10-CM | POA: Diagnosis not present

## 2021-06-05 DIAGNOSIS — I82432 Acute embolism and thrombosis of left popliteal vein: Secondary | ICD-10-CM | POA: Diagnosis present

## 2021-06-05 DIAGNOSIS — G4700 Insomnia, unspecified: Secondary | ICD-10-CM | POA: Diagnosis present

## 2021-06-05 DIAGNOSIS — N179 Acute kidney failure, unspecified: Secondary | ICD-10-CM

## 2021-06-05 DIAGNOSIS — Z66 Do not resuscitate: Secondary | ICD-10-CM | POA: Diagnosis present

## 2021-06-05 DIAGNOSIS — N1831 Chronic kidney disease, stage 3a: Secondary | ICD-10-CM | POA: Diagnosis present

## 2021-06-05 DIAGNOSIS — I2694 Multiple subsegmental pulmonary emboli without acute cor pulmonale: Secondary | ICD-10-CM | POA: Diagnosis present

## 2021-06-05 DIAGNOSIS — D509 Iron deficiency anemia, unspecified: Secondary | ICD-10-CM | POA: Diagnosis present

## 2021-06-05 DIAGNOSIS — I129 Hypertensive chronic kidney disease with stage 1 through stage 4 chronic kidney disease, or unspecified chronic kidney disease: Secondary | ICD-10-CM | POA: Diagnosis present

## 2021-06-05 DIAGNOSIS — F32A Depression, unspecified: Secondary | ICD-10-CM | POA: Diagnosis present

## 2021-06-05 DIAGNOSIS — Z681 Body mass index (BMI) 19 or less, adult: Secondary | ICD-10-CM | POA: Diagnosis not present

## 2021-06-05 DIAGNOSIS — I82442 Acute embolism and thrombosis of left tibial vein: Secondary | ICD-10-CM | POA: Diagnosis present

## 2021-06-05 DIAGNOSIS — H409 Unspecified glaucoma: Secondary | ICD-10-CM | POA: Diagnosis present

## 2021-06-05 DIAGNOSIS — E739 Lactose intolerance, unspecified: Secondary | ICD-10-CM | POA: Diagnosis present

## 2021-06-05 DIAGNOSIS — H9190 Unspecified hearing loss, unspecified ear: Secondary | ICD-10-CM | POA: Diagnosis present

## 2021-06-05 DIAGNOSIS — I82412 Acute embolism and thrombosis of left femoral vein: Secondary | ICD-10-CM | POA: Diagnosis present

## 2021-06-05 DIAGNOSIS — I2609 Other pulmonary embolism with acute cor pulmonale: Secondary | ICD-10-CM

## 2021-06-05 DIAGNOSIS — I82452 Acute embolism and thrombosis of left peroneal vein: Secondary | ICD-10-CM | POA: Diagnosis present

## 2021-06-05 DIAGNOSIS — R066 Hiccough: Secondary | ICD-10-CM | POA: Diagnosis present

## 2021-06-05 DIAGNOSIS — J690 Pneumonitis due to inhalation of food and vomit: Secondary | ICD-10-CM | POA: Diagnosis not present

## 2021-06-05 DIAGNOSIS — F17210 Nicotine dependence, cigarettes, uncomplicated: Secondary | ICD-10-CM | POA: Diagnosis present

## 2021-06-05 DIAGNOSIS — J9601 Acute respiratory failure with hypoxia: Secondary | ICD-10-CM | POA: Diagnosis present

## 2021-06-05 DIAGNOSIS — Z20822 Contact with and (suspected) exposure to covid-19: Secondary | ICD-10-CM | POA: Diagnosis present

## 2021-06-05 DIAGNOSIS — R54 Age-related physical debility: Secondary | ICD-10-CM | POA: Diagnosis present

## 2021-06-05 DIAGNOSIS — I2699 Other pulmonary embolism without acute cor pulmonale: Secondary | ICD-10-CM | POA: Diagnosis present

## 2021-06-05 DIAGNOSIS — R636 Underweight: Secondary | ICD-10-CM | POA: Diagnosis present

## 2021-06-05 DIAGNOSIS — M542 Cervicalgia: Secondary | ICD-10-CM | POA: Diagnosis present

## 2021-06-05 LAB — ECHOCARDIOGRAM COMPLETE
Area-P 1/2: 3.42 cm2
Height: 70 in
S' Lateral: 2.27 cm
Weight: 1904.77 oz

## 2021-06-05 LAB — BASIC METABOLIC PANEL
Anion gap: 7 (ref 5–15)
BUN: 11 mg/dL (ref 8–23)
CO2: 24 mmol/L (ref 22–32)
Calcium: 8.6 mg/dL — ABNORMAL LOW (ref 8.9–10.3)
Chloride: 108 mmol/L (ref 98–111)
Creatinine, Ser: 1.43 mg/dL — ABNORMAL HIGH (ref 0.61–1.24)
GFR, Estimated: 52 mL/min — ABNORMAL LOW (ref >60)
Glucose, Bld: 91 mg/dL (ref 70–99)
Potassium: 4 mmol/L (ref 3.5–5.1)
Sodium: 139 mmol/L (ref 135–145)

## 2021-06-05 LAB — CBC
HCT: 37.2 % — ABNORMAL LOW (ref 39.0–52.0)
Hemoglobin: 11.2 g/dL — ABNORMAL LOW (ref 13.0–17.0)
MCH: 29.8 pg (ref 26.0–34.0)
MCHC: 30.1 g/dL (ref 30.0–36.0)
MCV: 98.9 fL (ref 80.0–100.0)
Platelets: 184 10*3/uL (ref 150–400)
RBC: 3.76 MIL/uL — ABNORMAL LOW (ref 4.22–5.81)
RDW: 13.6 % (ref 11.5–15.5)
WBC: 8.8 10*3/uL (ref 4.0–10.5)
nRBC: 0 % (ref 0.0–0.2)

## 2021-06-05 LAB — HEPARIN LEVEL (UNFRACTIONATED): Heparin Unfractionated: 0.38 IU/mL (ref 0.30–0.70)

## 2021-06-05 MED ORDER — ADULT MULTIVITAMIN W/MINERALS CH
1.0000 | ORAL_TABLET | Freq: Every day | ORAL | Status: DC
  Administered 2021-06-05 – 2021-06-13 (×9): 1 via ORAL
  Filled 2021-06-05 (×9): qty 1

## 2021-06-05 MED ORDER — ALPRAZOLAM 0.5 MG PO TABS
0.5000 mg | ORAL_TABLET | Freq: Three times a day (TID) | ORAL | Status: DC | PRN
  Administered 2021-06-05 – 2021-06-09 (×3): 0.5 mg via ORAL
  Filled 2021-06-05 (×3): qty 1

## 2021-06-05 MED ORDER — ENSURE ENLIVE PO LIQD
237.0000 mL | Freq: Two times a day (BID) | ORAL | Status: DC
  Administered 2021-06-05 – 2021-06-13 (×15): 237 mL via ORAL

## 2021-06-05 MED ORDER — PANTOPRAZOLE SODIUM 40 MG PO TBEC
40.0000 mg | DELAYED_RELEASE_TABLET | Freq: Every day | ORAL | Status: DC
  Administered 2021-06-05 – 2021-06-13 (×9): 40 mg via ORAL
  Filled 2021-06-05 (×9): qty 1

## 2021-06-05 NOTE — Progress Notes (Signed)
Initial Nutrition Assessment  DOCUMENTATION CODES:   Underweight  INTERVENTION:   -Ensure Enlive po BID, each supplement provides 350 kcal and 20 grams of protein  -Multivitamin with minerals daily  NUTRITION DIAGNOSIS:   Increased nutrient needs related to acute illness as evidenced by estimated needs.  GOAL:   Patient will meet greater than or equal to 90% of their needs  MONITOR:   PO intake, Supplement acceptance, Labs, Weight trends, I & O's  REASON FOR ASSESSMENT:   Consult Assessment of nutrition requirement/status  ASSESSMENT:   70 yo male with the past medical history of hypertension, who presented with dyspnea and insomnia for about 2 days. Admitted to the hospital with the working diagnosis of bilateral pulmonary embolism.  Patient just getting to floor at time of visit.  Per chart review, pt unable to provide history given severe HOH.  Pt with history of severe malnutrition. Will order Ensure supplements with daily MVI.  Per weight history, no weight changes noted. Will likely need updated weight.   Suspect malnutrition continues.  Medications: Reglan, Remeron, Multivitamin with minerals daily  Labs reviewed.  NUTRITION - FOCUSED PHYSICAL EXAM:  Unable to complete at this time.  Diet Order:   Diet Order             Diet 2 gram sodium Room service appropriate? Yes; Fluid consistency: Thin  Diet effective now                   EDUCATION NEEDS:   No education needs have been identified at this time  Skin:     Last BM:  PTA  Height:   Ht Readings from Last 1 Encounters:  06/04/21 5\' 10"  (1.778 m)    Weight:   Wt Readings from Last 1 Encounters:  06/04/21 54 kg    BMI:  Body mass index is 17.08 kg/m.  Estimated Nutritional Needs:   Kcal:  1700-1900  Protein:  85-95g  Fluid:  1.7L/day  06/06/21, MS, RD, LDN Inpatient Clinical Dietitian Contact information available via Amion

## 2021-06-05 NOTE — Progress Notes (Signed)
ANTICOAGULATION CONSULT NOTE - Initial Consult  Pharmacy Consult for IV heparin Indication: pulmonary embolus  Allergies  Allergen Reactions   Lactose Intolerance (Gi)     UNK reaction    Patient Measurements: Height: 5\' 10"  (177.8 cm) Weight: 54 kg (119 lb 0.8 oz) IBW/kg (Calculated) : 73 Heparin Dosing Weight:   Vital Signs: Temp: 98.2 F (36.8 C) (07/20 1302) Temp Source: Oral (07/20 1302) BP: 137/75 (07/20 1302) Pulse Rate: 67 (07/20 1302)  Labs: Recent Labs    06/04/21 1318 06/04/21 1738 06/05/21 0415 06/05/21 1428  HGB 11.7*  --  11.2*  --   HCT 38.4*  --  37.2*  --   PLT 186  --  184  --   HEPARINUNFRC  --   --   --  0.38  CREATININE 1.42*  --  1.43*  --   TROPONINIHS  --  2  --   --      Estimated Creatinine Clearance: 36.2 mL/min (A) (by C-G formula based on SCr of 1.43 mg/dL (H)).   Medical History: Past Medical History:  Diagnosis Date   Glaucoma    HOH (hard of hearing)    Hypertension     Medications:  Scheduled:   brimonidine  1 drop Left Eye TID   cycloSPORINE  1 drop Both Eyes BID   dorzolamide-timolol  1 drop Both Eyes BID   feeding supplement  237 mL Oral BID BM   hydrALAZINE  25 mg Oral BID   latanoprost  1 drop Both Eyes QHS   metoCLOPramide (REGLAN) injection  5 mg Intravenous Q6H   mirtazapine  15 mg Oral QHS   multivitamin with minerals  1 tablet Oral Daily   pantoprazole  40 mg Oral Daily    Assessment: Pharmacy is consulted to dose heparin in 69 yo male diagnosed with bilateral PE. No noted anticoagulation on home med list. CT of chest shows Right lower, left lower, left upper lobe segmental pulmonaryemboli. No associated right heart strain or pulmonary infarction.  Today, 06/05/21 4:01 PM  HL at goal at 0.38 CBC stable No bleeding or infusion-related issues per RN  Goal of Therapy:  Heparin level 0.3-0.7 units/ml Monitor platelets by anticoagulation protocol: Yes   Plan:  Continue heparin infusion at 900  units/hr Confirmatory HL in 8 hours (with AM labs) Daily CBC while on heparin Monitor for signs and symptoms of bleeding  06/07/21  06/05/2021 4:01 PM

## 2021-06-05 NOTE — Progress Notes (Signed)
Bilateral lower extremity venous duplex has been completed. Preliminary results can be found in CV Proc through chart review.  Results were given to the patient's nurse, Abby.  06/05/21 9:28 AM Olen Cordial RVT

## 2021-06-05 NOTE — ED Notes (Signed)
Meal tray given to pt.

## 2021-06-05 NOTE — Progress Notes (Addendum)
PROGRESS NOTE    Edwin Wright  YBO:[id number] DOB: 1951/03/03 DOA: 06/04/2021 PCP: Almetta Lovely, [facility]    Brief Narrative:  Edwin Wright was admitted to the hospital with the working diagnosis of bilateral pulmonary embolism.   70 yo male with the past medical history of hypertension, who presented with dyspnea and insomnia for about 2 days.  Limited history due to decreased hearing.  Reported having hiccups at home associated with worsening dyspnea.  No chest pain.  On his initial physical examination blood pressure 119/65, heart rate 69, respiratory rate 26, oxygen saturation 98%, his lungs had no wheezing or rales, heart S1-S2, present, rhythmic, soft abdomen, no lower extremity edema.  Sodium 140, potassium 4.3, chloride 106, bicarb 26, glucose 98, BUN 13, creatinine 1.42, high sensitive troponin 2, white count 8.7, hemoglobin 11.7, hematocrit 38.4, platelets 186. SARS COVID-19 negative.  Chest radiograph with hyperinflation no infiltrates. Chest CT with right lower, left lower, left upper lobe segmental pulmonary embolism.  No right heart strain or pulm infarction.  EKG 69 bpm, normal axis, normal intervals, sinus rhythm with poor R wave progression, concave ST elevation in lead II, lead III, aVF, no significant T wave changes.  Patient was placed on heparin drip with good toleration.  Assessment & Plan:   Principal Problem:   Bilateral pulmonary embolism (HCC) Active Problems:   Hypertension   ARF (acute renal failure) (HCC)   Iron deficiency anemia   Pulmonary embolism (HCC)   Bilateral pulmonary embolism. Patient reports improved dyspnea with supplemental 02, no chest pain. Oxygenation is 100% on supplemental 02 per Edwin Wright.   Plan to continue anticoagulation with IV heparin for now until results from echocardiogram. Continue oxymetry monitoring. Follow with Edwin Wright lower extremities to assess clot burden.  Ok to admit patient to telemetry.   2. COPD with tobacco abuse No  clinical signs of COPD exacerbation.  Continue with as needed bronchodilator therapy. Smoking cessation counseling.  Follow with nutrition recommendations.   3. HTN. Continue blood pressure control with bid hydralazine   4. CKD stage 3a. Patient clinically euvolemic suspect CKD and no AKI. Hold on IV fluids and follow up renal function in am. Avoid hypotension and nephrotoxic medications.   5. Depression, low BMI. Continue with mirtazapine, follow with nutrition recommendations.   Patient continue to be at high risk for worsening pulmonary embolism   Status is: Observation  The patient will require care spanning > 2 midnights and should be moved to inpatient because: IV treatments appropriate due to intensity of illness or inability to take PO  Dispo: The patient is from: Home              Anticipated d/c is to: Home              Patient currently is not medically stable to d/c.   Difficult to place patient No   DVT prophylaxis: Heparin drip   Code Status:   full  Family Communication:   No family at the bedside      Subjective: Patient with positive dyspnea, not yet back to baseline, improved with supplemental 02 per Edwin Wright, no chest pain   Objective: Vitals:   06/05/21 0630 06/05/21 0700 06/05/21 0715 06/05/21 0830  BP: (!) 150/69 (!) 148/72  (!) 150/71  Pulse: 89 87 81 86  Resp: (!) 25 (!) 22 (!) 21 14  Temp:      TempSrc:      SpO2: 100% 100% 100% 100%  Weight:  Height:        Intake/Output Summary (Last 24 hours) at 06/05/2021 0905 Last data filed at 06/04/2021 1754 Gross per 24 hour  Intake 42.58 ml  Output --  Net 42.58 ml   Filed Weights   06/04/21 1221  Weight: 54 kg    Examination:   General: Not in pain or dyspnea Neurology: Awake and alert, non focal  E ENT: mild pallor, no icterus, oral mucosa moist Cardiovascular: No JVD. S1-S2 present, rhythmic, no gallops, rubs, or murmurs. No lower extremity edema. Pulmonary: positive breath sounds  bilaterally, no wheezing, rhonchi or rales. Gastrointestinal. Abdomen soft and non tender Skin. No rashes Musculoskeletal: no joint deformities     Data Reviewed: I have personally reviewed following labs and imaging studies  CBC: Recent Labs  Lab 06/04/21 1318 06/05/21 0415  WBC 8.7 8.8  NEUTROABS 5.1  --   HGB 11.7* 11.2*  HCT 38.4* 37.2*  MCV 98.2 98.9  PLT 186 184   Basic Metabolic Panel: Recent Labs  Lab 06/04/21 1318 06/05/21 0415  NA 140 139  K 4.3 4.0  CL 106 108  CO2 26 24  GLUCOSE 98 91  BUN 13 11  CREATININE 1.42* 1.43*  CALCIUM 9.3 8.6*   GFR: Estimated Creatinine Clearance: 36.2 mL/min (A) (by C-G formula based on SCr of 1.43 mg/dL (H)). Liver Function Tests: No results for input(s): AST, ALT, ALKPHOS, BILITOT, PROT, ALBUMIN in the last 168 hours. No results for input(s): LIPASE, AMYLASE in the last 168 hours. No results for input(s): AMMONIA in the last 168 hours. Coagulation Profile: No results for input(s): INR, PROTIME in the last 168 hours. Cardiac Enzymes: No results for input(s): CKTOTAL, CKMB, CKMBINDEX, TROPONINI in the last 168 hours. BNP (last 3 results) No results for input(s): PROBNP in the last 8760 hours. HbA1C: No results for input(s): HGBA1C in the last 72 hours. CBG: No results for input(s): GLUCAP in the last 168 hours. Lipid Profile: No results for input(s): CHOL, HDL, LDLCALC, TRIG, CHOLHDL, LDLDIRECT in the last 72 hours. Thyroid Function Tests: No results for input(s): TSH, T4TOTAL, FREET4, T3FREE, THYROIDAB in the last 72 hours. Anemia Panel: No results for input(s): VITAMINB12, FOLATE, FERRITIN, TIBC, IRON, RETICCTPCT in the last 72 hours.    Radiology Studies: I have reviewed all of the imaging during this hospital visit personally     Scheduled Meds:  brimonidine  1 drop Left Eye TID   cycloSPORINE  1 drop Both Eyes BID   dorzolamide-timolol  1 drop Both Eyes BID   hydrALAZINE  25 mg Oral BID   latanoprost   1 drop Both Eyes QHS   metoCLOPramide (REGLAN) injection  5 mg Intravenous Q6H   mirtazapine  15 mg Oral QHS   pantoprazole (PROTONIX) IV  40 mg Intravenous Daily   Continuous Infusions:  sodium chloride 75 mL/hr at 06/05/21 0352   heparin 900 Units/hr (06/05/21 0546)   promethazine (PHENERGAN) injection (IM or IVPB)       LOS: 0 days        Yaasir Menken Annett Gula, MD

## 2021-06-05 NOTE — Progress Notes (Signed)
SATURATION QUALIFICATIONS: (This note is used to comply with regulatory documentation for home oxygen)  Patient Saturations on Room Air at Rest = 98%

## 2021-06-05 NOTE — Progress Notes (Signed)
PT Cancellation Note  Patient Details Name: Edwin Wright MRN: [id number] DOB: 04-08-1951   Cancelled Treatment:    Reason Eval/Treat Not Completed: Medical issues which prohibited therapy Pt admitted for PE and currently on Heparin.  No current HL yet today so will await therapeutic dosing prior to mobilizing.   Jestine Bicknell,KATHrine E 06/05/2021, 2:26 PM Kati PT, DPT Acute Rehabilitation Services Pager: [phone] Office: [phone]

## 2021-06-05 NOTE — Progress Notes (Signed)
  Echocardiogram 2D Echocardiogram has been performed.  Augustine Radar 06/05/2021, 11:48 AM

## 2021-06-06 ENCOUNTER — Other Ambulatory Visit (HOSPITAL_COMMUNITY): Payer: Self-pay

## 2021-06-06 ENCOUNTER — Inpatient Hospital Stay (HOSPITAL_COMMUNITY): Payer: Medicare Other

## 2021-06-06 DIAGNOSIS — N179 Acute kidney failure, unspecified: Secondary | ICD-10-CM | POA: Diagnosis not present

## 2021-06-06 DIAGNOSIS — I2699 Other pulmonary embolism without acute cor pulmonale: Secondary | ICD-10-CM | POA: Diagnosis not present

## 2021-06-06 DIAGNOSIS — I10 Essential (primary) hypertension: Secondary | ICD-10-CM | POA: Diagnosis not present

## 2021-06-06 DIAGNOSIS — D509 Iron deficiency anemia, unspecified: Secondary | ICD-10-CM | POA: Diagnosis not present

## 2021-06-06 LAB — BASIC METABOLIC PANEL
Anion gap: 13 (ref 5–15)
BUN: 13 mg/dL (ref 8–23)
CO2: 20 mmol/L — ABNORMAL LOW (ref 22–32)
Calcium: 9.1 mg/dL (ref 8.9–10.3)
Chloride: 104 mmol/L (ref 98–111)
Creatinine, Ser: 1.4 mg/dL — ABNORMAL HIGH (ref 0.61–1.24)
GFR, Estimated: 54 mL/min — ABNORMAL LOW (ref >60)
Glucose, Bld: 82 mg/dL (ref 70–99)
Potassium: 3.9 mmol/L (ref 3.5–5.1)
Sodium: 137 mmol/L (ref 135–145)

## 2021-06-06 LAB — CBC
HCT: 35.9 % — ABNORMAL LOW (ref 39.0–52.0)
Hemoglobin: 11 g/dL — ABNORMAL LOW (ref 13.0–17.0)
MCH: 30.1 pg (ref 26.0–34.0)
MCHC: 30.6 g/dL (ref 30.0–36.0)
MCV: 98.1 fL (ref 80.0–100.0)
Platelets: 205 10*3/uL (ref 150–400)
RBC: 3.66 MIL/uL — ABNORMAL LOW (ref 4.22–5.81)
RDW: 13.7 % (ref 11.5–15.5)
WBC: 10.9 10*3/uL — ABNORMAL HIGH (ref 4.0–10.5)
nRBC: 0 % (ref 0.0–0.2)

## 2021-06-06 LAB — HEPARIN LEVEL (UNFRACTIONATED): Heparin Unfractionated: 0.81 IU/mL — ABNORMAL HIGH (ref 0.30–0.70)

## 2021-06-06 MED ORDER — CYCLOBENZAPRINE HCL 5 MG PO TABS
5.0000 mg | ORAL_TABLET | Freq: Three times a day (TID) | ORAL | Status: DC | PRN
  Administered 2021-06-06 – 2021-06-09 (×5): 5 mg via ORAL
  Filled 2021-06-06 (×5): qty 1

## 2021-06-06 MED ORDER — CYCLOBENZAPRINE HCL 5 MG PO TABS: 5.0000 mg | ORAL_TABLET | Freq: Three times a day (TID) | ORAL | Status: DC

## 2021-06-06 MED ORDER — OXYCODONE HCL 5 MG PO TABS
5.0000 mg | ORAL_TABLET | ORAL | Status: DC | PRN
  Administered 2021-06-06 – 2021-06-09 (×7): 5 mg via ORAL
  Filled 2021-06-06 (×7): qty 1

## 2021-06-06 MED ORDER — APIXABAN 5 MG PO TABS
10.0000 mg | ORAL_TABLET | Freq: Two times a day (BID) | ORAL | Status: AC
Start: 2021-06-06 — End: 2021-06-12
  Administered 2021-06-06 – 2021-06-12 (×14): 10 mg via ORAL
  Filled 2021-06-06 (×17): qty 2

## 2021-06-06 MED ORDER — APIXABAN 5 MG PO TABS
5.0000 mg | ORAL_TABLET | Freq: Two times a day (BID) | ORAL | Status: DC
  Administered 2021-06-13: 5 mg via ORAL
  Filled 2021-06-06: qty 1

## 2021-06-06 NOTE — Progress Notes (Signed)
PROGRESS NOTE    Edwin Wright  TOI:[id number] DOB: [birth year] DOA: 06/04/2021 PCP: Almetta Lovely, [facility]    Brief Narrative:  Edwin Wright was admitted to the hospital with the working diagnosis of bilateral pulmonary embolism.   70 yo male with the past medical history of hypertension, who presented with dyspnea and insomnia for about 2 days.  Limited history due to decreased hearing.  Reported having hiccups at home associated with worsening dyspnea.  No chest pain.  On his initial physical examination blood pressure 119/65, heart rate 69, respiratory rate 26, oxygen saturation 98%, his lungs had no wheezing or rales, heart S1-S2, present, rhythmic, soft abdomen, no lower extremity edema.   Sodium 140, potassium 4.3, chloride 106, bicarb 26, glucose 98, BUN 13, creatinine 1.42, high sensitive troponin 2, white count 8.7, hemoglobin 11.7, hematocrit 38.4, platelets 186. SARS COVID-19 negative.   Chest radiograph with hyperinflation no infiltrates. Chest CT with right lower, left lower, left upper lobe segmental pulmonary embolism.  No right heart strain or pulm infarction.   EKG 69 bpm, normal axis, normal intervals, sinus rhythm with poor R wave progression, concave ST elevation in lead II, lead III, aVF, no significant T wave changes.   Patient was placed on heparin drip with good toleration.   He had persistent dyspnea and tachypnea while off oxygen, requiring continuous supplemental 02 per [place] Echocardiogram with preserved RV systolic function.  Now transitioned to oral anticoagulation with apixaban.    Patient very weak and deconditioned, persistent dyspnea on exertion, PT has recommended SNF to continue physical therapy and reconditioning.   Assessment & Plan:   Principal Problem:   Bilateral pulmonary embolism (HCC) Active Problems:   Hypertension   ARF (acute renal failure) (HCC)   Iron deficiency anemia   Pulmonary embolism (HCC)     Acute bilateral pulmonary  embolism/  NEW extensive left lower extremity deep vein thrombosis.  Patient with tachypnea and dyspnea while off supplemental 02, his oxygenation on 1L/min per [place] is 98%.  Today with significant pain in the left lower extremity with difficulty ambulating.    Echocardiogram with preserved LV and RV function.  Lower extremity edema with positive deep vein thrombosis involving the left femoral vein, left popliteal vein, left posterior tibial veins and left peroneal veins.   Continue anticoagulation with apixaban. Added oxycodone for left leg pain, check left hip, knee and ankle radiographs.  PT has recommended SNF.    2. COPD with tobacco abuse No signs of acute clinical exacerbation.  On as needed bronchodilator therapy. Continue Smoking cessation counseling. Pending nutrition evaluation.    3. HTN. On bid hydralazine for blood pressure control.    4. CKD stage 3a. Patient clinically euvolemic suspect CKD and no AKI.  Stable renal function with serum cr at 1,40 with K at 3,9 and serum bicarbonate at 20.   5. Depression, low BMI. On mirtazapine. Pending nutrition recommendations.  Add as needed flexeril for neck pain.    Status is: Inpatient  Remains inpatient appropriate because:Inpatient level of care appropriate due to severity of illness  Dispo: The patient is from: Home              Anticipated d/c is to: SNF              Patient currently is not medically stable to d/c.   Difficult to place patient No    DVT prophylaxis: Apixaban   Code Status:    full  Family Communication:  I spoke  over the phone with the patient's sister about patient's  condition, plan of care, prognosis and all questions were addressed.     Nutrition Status: Nutrition Problem: Increased nutrient needs Etiology: acute illness Signs/Symptoms: estimated needs Interventions: Ensure Enlive (each supplement provides 350kcal and 20 grams of protein), MVI     Subjective: Patient continue to have  dyspnea and tachypnea when off supplemental 02. Today with significant left leg pain, and difficulty ambulating.   Objective: Vitals:   06/05/21 2058 06/06/21 0102 06/06/21 0424 06/06/21 1103  BP: 139/69 140/67 129/60 (!) 165/87  Pulse: 86 77 79 (!) 101  Resp: 20 18 18    Temp: (!) 97.3 F (36.3 C) (!) 97.3 F (36.3 C) (!) 97.5 F (36.4 C) (!) 97.4 F (36.3 C)  TempSrc: Oral Oral Oral Oral  SpO2: 99% 100% 100% 98%  Weight:      Height:        Intake/Output Summary (Last 24 hours) at 06/06/2021 1427 Last data filed at 06/05/2021 2205 Gross per 24 hour  Intake 345.92 ml  Output 500 ml  Net -154.08 ml   Filed Weights   06/04/21 1221  Weight: 54 kg    Examination:   General: Not in pain or dyspnea, deconditioned  Neurology: Awake and alert, non focal  E ENT: mild pallor, no icterus, oral mucosa moist Cardiovascular: No JVD. S1-S2 present, rhythmic, no gallops, rubs, or murmurs. No lower extremity edema. Pulmonary: positive breath sounds bilaterally, adequate air movement, no wheezing, rhonchi or rales. Gastrointestinal. Abdomen soft and non tender Skin. No rashes Musculoskeletal: no joint deformities/  left neck muscle spasms, positive pain at the left lower extremity with poor mobility.      Data Reviewed: I have personally reviewed following labs and imaging studies  CBC: Recent Labs  Lab 06/04/21 1318 06/05/21 0415 06/06/21 0523  WBC 8.7 8.8 10.9*  NEUTROABS 5.1  --   --   HGB 11.7* 11.2* 11.0*  HCT 38.4* 37.2* 35.9*  MCV 98.2 98.9 98.1  PLT 186 184 205   Basic Metabolic Panel: Recent Labs  Lab 06/04/21 1318 06/05/21 0415 06/06/21 0523  NA 140 139 137  K 4.3 4.0 3.9  CL 106 108 104  CO2 26 24 20*  GLUCOSE 98 91 82  BUN 13 11 13   CREATININE 1.42* 1.43* 1.40*  CALCIUM 9.3 8.6* 9.1   GFR: Estimated Creatinine Clearance: 37 mL/min (A) (by C-G formula based on SCr of 1.4 mg/dL (H)). Liver Function Tests: No results for input(s): AST, ALT, ALKPHOS,  BILITOT, PROT, ALBUMIN in the last 168 hours. No results for input(s): LIPASE, AMYLASE in the last 168 hours. No results for input(s): AMMONIA in the last 168 hours. Coagulation Profile: No results for input(s): INR, PROTIME in the last 168 hours. Cardiac Enzymes: No results for input(s): CKTOTAL, CKMB, CKMBINDEX, TROPONINI in the last 168 hours. BNP (last 3 results) No results for input(s): PROBNP in the last 8760 hours. HbA1C: No results for input(s): HGBA1C in the last 72 hours. CBG: No results for input(s): GLUCAP in the last 168 hours. Lipid Profile: No results for input(s): CHOL, HDL, LDLCALC, TRIG, CHOLHDL, LDLDIRECT in the last 72 hours. Thyroid Function Tests: No results for input(s): TSH, T4TOTAL, FREET4, T3FREE, THYROIDAB in the last 72 hours. Anemia Panel: No results for input(s): VITAMINB12, FOLATE, FERRITIN, TIBC, IRON, RETICCTPCT in the last 72 hours.    Radiology Studies: I have reviewed all of the imaging during this hospital visit personally     Scheduled  Meds:  apixaban  10 mg Oral BID   Followed by   Melene Muller ON 06/13/2021] apixaban  5 mg Oral BID   brimonidine  1 drop Left Eye TID   cycloSPORINE  1 drop Both Eyes BID   dorzolamide-timolol  1 drop Both Eyes BID   feeding supplement  237 mL Oral BID BM   hydrALAZINE  25 mg Oral BID   latanoprost  1 drop Both Eyes QHS   metoCLOPramide (REGLAN) injection  5 mg Intravenous Q6H   mirtazapine  15 mg Oral QHS   multivitamin with minerals  1 tablet Oral Daily   pantoprazole  40 mg Oral Daily   Continuous Infusions:  promethazine (PHENERGAN) injection (IM or IVPB) 6.25 mg (06/05/21 1734)     LOS: 1 day        Dacey Milberger Annett Gula, MD

## 2021-06-06 NOTE — TOC Initial Note (Signed)
Transition of Care [facility]) - Initial/Assessment Note    Patient Details  Name: Edwin Wright MRN: [id number] Date of Birth: [birth year]  [facility] CM/SW Contact:    Ida Rogue, LCSW Phone Number: 06/06/2021, 2:28 PM  Clinical Narrative:   Patient seen in follow up to PT/OT recommendation of SNF.  Mr Welte's sister, Ms Dallas Schimke, was in the room and he gave permission for her to speak for him as he is extremely hard of hearing. His sight is impaired as well.  Mr Boughner was living in an ALF for several years until March of this year when his sister took him in as the cost of ALF kept increasing. He was in SNF after a previous hospitalization, and they both agree that he should go back if he does not regain his strength to a greater degree during his stay here. Bed search initiated. TOC will continue to follow during the course of hospitalization.             Expected Discharge Plan: Skilled Nursing Facility Barriers to Discharge: SNF Pending bed offer   Patient Goals and CMS Choice     Choice offered to / list presented to : Patient, Sibling  Expected Discharge Plan and Services Expected Discharge Plan: Skilled Nursing Facility   Discharge Planning Services: CM Consult Post Acute Care Choice: Skilled Nursing Facility Living arrangements for the past 2 months: Single Family Home                                      Prior Living Arrangements/Services Living arrangements for the past 2 months: Single Family Home Lives with:: Siblings Patient language and need for interpreter reviewed:: Yes        Need for Family Participation in Patient Care: Yes (Comment) Care giver support system in place?: Yes (comment) Current home services: DME Criminal Activity/Legal Involvement Pertinent to Current Situation/Hospitalization: No - Comment as needed  Activities of Daily Living Home Assistive Devices/Equipment: Other (Comment) (pt will not verbalize and will not  answer any questions) ADL Screening (condition at time of admission) Patient's cognitive ability adequate to safely complete daily activities?: No Is the patient deaf or have difficulty hearing?: Yes Does the patient have difficulty seeing, even when wearing glasses/contacts?: Yes (hx glaucoma) Does the patient have difficulty concentrating, remembering, or making decisions?: Yes (pt not answering any questions at time of admission hx.) Patient able to express need for assistance with ADLs?: No Does the patient have difficulty dressing or bathing?: Yes Independently performs ADLs?: No Communication: Needs assistance Is this a change from baseline?: Pre-admission baseline Dressing (OT): Needs assistance Is this a change from baseline?: Pre-admission baseline Grooming: Needs assistance Is this a change from baseline?: Pre-admission baseline Feeding: Needs assistance Is this a change from baseline?: Pre-admission baseline Bathing: Needs assistance Is this a change from baseline?: Pre-admission baseline Toileting: Needs assistance Is this a change from baseline?: Pre-admission baseline In/Out Bed: Needs assistance Is this a change from baseline?: Pre-admission baseline Walks in Home: Needs assistance Is this a change from baseline?: Pre-admission baseline Does the patient have difficulty walking or climbing stairs?: Yes Weakness of Legs: Both Weakness of Arms/Hands: Both  Permission Sought/Granted Permission sought to share information with : Family Supports Permission granted to share information with : Yes, Verbal Permission Granted  Share Information with NAME: Copeland,Dorothy (Sister)   [phone]  Emotional Assessment Appearance:: Appears stated age Attitude/Demeanor/Rapport: Engaged Affect (typically observed): Appropriate Orientation: : Oriented to Self, Oriented to Place, Oriented to Situation Alcohol / Substance Use: Not Applicable Psych Involvement: No  (comment)  Admission diagnosis:  Pulmonary embolism (HCC) [I26.99] Multiple subsegmental pulmonary emboli without acute cor pulmonale (HCC) [I26.94] Patient Active Problem List   Diagnosis Date Noted   Bilateral pulmonary embolism (HCC) 06/04/2021   Pulmonary embolism (HCC) 06/04/2021   Influenza A 11/11/2016   Protein-calorie malnutrition, severe 11/03/2016   Acute kidney injury (HCC)    Iron deficiency anemia    Hypokalemia    Hypomagnesemia    Hypertension 11/02/2016   Sepsis (HCC) 11/02/2016   Dehydration 11/02/2016   Glaucoma 11/02/2016   ARF (acute renal failure) (HCC) 11/02/2016   Anemia 11/02/2016   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   PCP:  Almetta Lovely, [facility]:   [id number] [place], [place] - [address] [place] [address] Phone: [phone] Fax: [phone]     Social Determinants of Health (SDOH) Interventions    Readmission Risk Interventions No flowsheet data found.

## 2021-06-06 NOTE — NC FL2 (Signed)
[place] MEDICAID FL2 LEVEL OF CARE SCREENING TOOL     IDENTIFICATION  Patient Name: Edwin Wright Birthdate: May 02, 1951 Sex: male Admission Date (Current Location): 06/04/2021  [facility] and [place] Number:  Producer, television/film/video and Address:  [facility],  [address] [place], [place] [address]      Provider Number: [id number]  Attending Physician Name and Address:  Coralie Keens,*  Relative Name and Phone Number:  Sharren Bridge (Sister)   [phone]    Current Level of Care: Hospital Recommended Level of Care: Skilled Nursing Facility Prior Approval Number:    Date Approved/Denied:   PASRR Number: [id number]  Discharge Plan: SNF    Current Diagnoses: Patient Active Problem List   Diagnosis Date Noted   Bilateral pulmonary embolism (HCC) 06/04/2021   Pulmonary embolism (HCC) 06/04/2021   Influenza A 11/11/2016   Protein-calorie malnutrition, severe 11/03/2016   Acute kidney injury (HCC)    Iron deficiency anemia    Hypokalemia    Hypomagnesemia    Hypertension 11/02/2016   Sepsis (HCC) 11/02/2016   Dehydration 11/02/2016   Glaucoma 11/02/2016   ARF (acute renal failure) (HCC) 11/02/2016   Anemia 11/02/2016   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    Orientation RESPIRATION BLADDER Height & Weight     Self, Situation, Place  Normal External catheter Weight: 54 kg Height:  5\' 10"  (177.8 cm)  BEHAVIORAL SYMPTOMS/MOOD NEUROLOGICAL BOWEL NUTRITION STATUS      Incontinent  (see d/c summary)  AMBULATORY STATUS COMMUNICATION OF NEEDS Skin   Extensive Assist Verbally Normal                       Personal Care Assistance Level of Assistance  Bathing, Feeding, Dressing Bathing Assistance: Maximum assistance Feeding assistance: Independent Dressing Assistance: Limited assistance     Functional Limitations Info  Sight, Hearing, Speech Sight Info: Impaired Hearing Info: Impaired Speech Info: Adequate    SPECIAL CARE  FACTORS FREQUENCY  PT (By licensed PT), OT (By licensed OT)     PT Frequency: 5X/W OT Frequency: 5X/W            Contractures Contractures Info: Not present    Additional Factors Info  Code Status, Allergies Code Status Info: full Allergies Info: Lactose intolerence           Current Medications (06/06/2021):  This is the current hospital active medication list Current Facility-Administered Medications  Medication Dose Route Frequency Provider Last Rate Last Admin   acetaminophen (TYLENOL) tablet 650 mg  650 mg Oral Q6H PRN Kc, Ramesh, MD       Or   acetaminophen (TYLENOL) suppository 650 mg  650 mg Rectal Q6H PRN Kc, Ramesh, MD       albuterol (PROVENTIL) (2.5 MG/3ML) 0.083% nebulizer solution 2.5 mg  2.5 mg Nebulization Q2H PRN Kc, Ramesh, MD   2.5 mg at 06/05/21 0352   ALPRAZolam (XANAX) tablet 0.5 mg  0.5 mg Oral TID PRN 06/07/21, MD   0.5 mg at 06/05/21 1805   apixaban (ELIQUIS) tablet 10 mg  10 mg Oral BID Arrien, 06/07/21, MD       Followed by   York Ram ON 06/13/2021] apixaban (ELIQUIS) tablet 5 mg  5 mg Oral BID Arrien, 06/15/2021, MD       brimonidine (ALPHAGAN) 0.2 % ophthalmic solution 1 drop  1 drop Left Eye TID York Ram, MD   1 drop at 06/06/21 1059   cycloSPORINE (RESTASIS) 0.05 %  ophthalmic emulsion 1 drop  1 drop Both Eyes BID Kc, Ramesh, MD   1 drop at 06/06/21 1307   dorzolamide-timolol (COSOPT) 22.3-6.8 MG/ML ophthalmic solution 1 drop  1 drop Both Eyes BID Kc, Ramesh, MD   1 drop at 06/06/21 1059   feeding supplement (ENSURE ENLIVE / ENSURE PLUS) liquid 237 mL  237 mL Oral BID BM Arrien, York Ram, MD   237 mL at 06/06/21 1308   hydrALAZINE (APRESOLINE) tablet 25 mg  25 mg Oral BID Kc, Ramesh, MD   25 mg at 06/06/21 1057   latanoprost (XALATAN) 0.005 % ophthalmic solution 1 drop  1 drop Both Eyes QHS Kc, Ramesh, MD   1 drop at 06/05/21 2159   metoCLOPramide (REGLAN) injection 5 mg  5 mg Intravenous Q6H Kc, Ramesh, MD   5 mg at  06/06/21 1242   mirtazapine (REMERON) tablet 15 mg  15 mg Oral QHS Kc, Dayna Barker, MD   15 mg at 06/05/21 2156   multivitamin with minerals tablet 1 tablet  1 tablet Oral Daily Arrien, York Ram, MD   1 tablet at 06/06/21 1057   oxyCODONE (Oxy IR/ROXICODONE) immediate release tablet 5 mg  5 mg Oral Q4H PRN Arrien, York Ram, MD       pantoprazole (PROTONIX) EC tablet 40 mg  40 mg Oral Daily Arrien, York Ram, MD   40 mg at 06/06/21 1057   promethazine (PHENERGAN) 6.25 mg in sodium chloride 0.9 % 50 mL IVPB  6.25 mg Intravenous Q8H PRN Kc, Ramesh, MD 200 mL/hr at 06/05/21 1734 6.25 mg at 06/05/21 1734   senna-docusate (Senokot-S) tablet 1 tablet  1 tablet Oral QHS PRN Lanae Boast, MD         Discharge Medications: Please see discharge summary for a list of discharge medications.  Relevant Imaging Results:  Relevant Lab Results:   Additional Information SSN:[id number]  Baldo Daub Darby, LCSW

## 2021-06-06 NOTE — TOC Benefit Eligibility Note (Signed)
Patient Product/process development scientist completed.    The patient is currently admitted and upon discharge could be taking Eliquis Starter Pak.  The current 30 day co-pay is, $179.85.   The patient is currently admitted and upon discharge could be taking Xarelto Starter Pak.  The current 30 day co-pay is, $240.15.   The patient is insured through [employer] Medicare Part D     Roland Earl, CPhT Pharmacy Patient Advocate Specialist [facility] Antimicrobial Stewardship Team Direct Number: [phone]  Fax: [phone]

## 2021-06-06 NOTE — Evaluation (Signed)
Physical Therapy Evaluation Patient Details Name: Edwin Wright MRN: [id number] DOB: 17-Jun-1951 Today's Date: 06/06/2021   History of Present Illness  Edwin Wright is a 70 y.o. male came to the ED with complaint of having hiccups which is causing him to feel short of breath and unable to sleep for last 2 days. Chest CT showed a right lower, left lower and left upper lobe segmental pulmonary emboli. Pt started on heparin and admitted. PMH: glaucoma, HTN, HOH.  Clinical Impression  Pt is a 70 y.o. male admitted with above HPI resulting in the deficits listed below (see PT Problem List). PT is VERY HOH requiring repeated verbal, tactile, and visual cues for sequencing and command following throughout session.Pt was experiencing increased L LE pain requiring MOD A +2 for transfer from chair to bed. PT deferred further mobility this session to maintain pt safety as he was very unsteady due to L LE pain putting him at increased risk for falls. Recommend SNF at this time as pt requires increased assist for all mobility. Pt will benefit from skilled PT to increase their independence and safety with mobility to allow discharge to the venue listed below.      Follow Up Recommendations SNF    Equipment Recommendations  Rolling walker with 5" wheels    Recommendations for Other Services       Precautions / Restrictions Precautions Precautions: Fall Precaution Comments: very HOH, low vision (Pt is able to hear better at times when PT was speaking close to L ear) Restrictions Weight Bearing Restrictions: No      Mobility  Bed Mobility Overal bed mobility: Needs Assistance Bed Mobility: Sit to Supine       Sit to supine: Min guard;HOB elevated   General bed mobility comments: Patient OOB in recliner upon entry. MIN guard for safety with sit to supine transfer    Transfers Overall transfer level: Needs assistance Equipment used: Rolling walker (2 wheeled);2 person hand held  assist Transfers: Sit to/from [employer] Sit to Stand: Min assist;+2 safety/equipment Stand pivot transfers: Mod assist;+2 physical assistance;+2 safety/equipment       General transfer comment: x2 from recliner; Min A +2 to rise x1 without use of RW and x1 with use of RW. Cues for upright posture, pt noted to be kyphotic in standing. Pt performed pre gait marching with MOD A +2 for stability and use of B UEs on RW. frequent cues for sequencing (though difficulty following due [facility]). MOD A +2 for stability with stand pivot to chair. Pt very unsteady due to L LE pain and with B UEs noted to be shaking from increased WB through RW. Pt able to take x1 side step to [facility] for repositioning. Further mobility deferred to maintain pt safety.  Ambulation/Gait                Stairs            Wheelchair Mobility    Modified Rankin (Stroke Patients Only)       Balance Overall balance assessment: Needs assistance Sitting-balance support: No upper extremity supported;Feet supported Sitting balance-Leahy Scale: Fair     Standing balance support: Bilateral upper extremity supported Standing balance-Leahy Scale: Poor Standing balance comment: reliant on external support to maintain standing balance                             Pertinent Vitals/Pain Pain Assessment: Faces Faces Pain Scale: Hurts whole  lot Pain Location: pt complaining of L neck pain and L LE pain in calf with movement and touch, pt rubbing L side of neck throughout session and guarding L LE. Pain Descriptors / Indicators: Grimacing;Guarding;Sore Pain Intervention(s): Limited activity within patient's tolerance;Monitored during session;Repositioned    Home Living Family/patient expects to be discharged to:: Private residence Living Arrangements: Other relatives (sister) Available Help at Discharge: Family;Other (Comment) (close to 24/7 per OT eval) Type of Home: House Home Access:  Stairs to enter Entrance Stairs-Rails: Right;Left Entrance Stairs-Number of Steps: 6 Home Layout: One level Home Equipment: Grab bars - tub/shower;Cane - quad      Prior Function Level of Independence: Independent with assistive device(s)         Comments: ambulatory without AD in home, use of cane outside of home (per sister, cane more to assist with low vision/varying terrains). Able to complete ADLs, no falls per sister. Assist with IADLs     Hand Dominance   Dominant Hand: Right    Extremity/Trunk Assessment   Upper Extremity Assessment Upper Extremity Assessment: Generalized weakness    Lower Extremity Assessment Lower Extremity Assessment: Generalized weakness    Cervical / Trunk Assessment Cervical / Trunk Assessment: Kyphotic  Communication   Communication: HOH  Cognition Arousal/Alertness: Awake/alert Behavior During Therapy: WFL for tasks assessed/performed Overall Cognitive Status: Difficult to assess                                 General Comments: Pt requiring multiple verbal, tactile, and visual cues for command following during session, expect due to [facility].      General Comments General comments (skin integrity, edema, etc.): Pt remained on 1L [place] throughout session    Exercises     Assessment/Plan    PT Assessment Patient needs continued PT services  PT Problem List Decreased strength;Decreased activity tolerance;Decreased balance;Decreased mobility;Pain       PT Treatment Interventions Gait training;Stair training;Functional mobility training;Therapeutic activities;Therapeutic exercise;Patient/family education    PT Goals (Current goals can be found in the Care Plan section)  Acute Rehab PT Goals Patient Stated Goal: sister would like for pt to recover well, return to normal functioning PT Goal Formulation: With patient/family Time For Goal Achievement: 06/20/21 Potential to Achieve Goals: Good    Frequency Min 2X/week    Barriers to discharge        Co-evaluation               AM-PAC PT "6 Clicks" Mobility  Outcome Measure Help needed turning from your back to your side while in a flat bed without using bedrails?: A Little Help needed moving from lying on your back to sitting on the side of a flat bed without using bedrails?: A Little Help needed moving to and from a bed to a chair (including a wheelchair)?: Total Help needed standing up from a chair using your arms (e.g., wheelchair or bedside chair)?: A Little Help needed to walk in hospital room?: Total Help needed climbing 3-5 steps with a railing? : Total 6 Click Score: 12    End of Session Equipment Utilized During Treatment: Gait belt Activity Tolerance: Patient limited by pain Patient left: in bed;with call bell/phone within reach;with chair alarm set;with family/visitor present Nurse Communication: Mobility status PT Visit Diagnosis: Unsteadiness on feet (R26.81);Muscle weakness (generalized) (M62.81);Difficulty in walking, not elsewhere classified (R26.2);Pain Pain - Right/Left: Left Pain - part of body: Leg  Time: 9574-7340 PT Time Calculation (min) (ACUTE ONLY): 27 min   Charges:   PT Evaluation $PT Eval Low Complexity: 1 Low PT Treatments $Therapeutic Activity: 8-22 mins       Lyman Speller PT, DPT  Acute Rehabilitation Services  Office [phone]    06/06/2021, 2:50 PM

## 2021-06-06 NOTE — Progress Notes (Signed)
ANTICOAGULATION CONSULT NOTE   Pharmacy Consult for IV heparin >> apixaban Indication: pulmonary embolus  Allergies  Allergen Reactions   Lactose Intolerance (Gi)     UNK reaction    Patient Measurements: Height: 5\' 10"  (177.8 cm) Weight: 54 kg (119 lb 0.8 oz) IBW/kg (Calculated) : 73 Heparin Dosing Weight: actual body weight  Vital Signs: Temp: 97.4 F (36.3 C) (07/21 1103) Temp Source: Oral (07/21 1103) BP: 165/87 (07/21 1103) Pulse Rate: 101 (07/21 1103)  Labs: Recent Labs    06/04/21 1318 06/04/21 1738 06/05/21 0415 06/05/21 1428 06/06/21 0523  HGB 11.7*  --  11.2*  --  11.0*  HCT 38.4*  --  37.2*  --  35.9*  PLT 186  --  184  --  205  HEPARINUNFRC  --   --   --  0.38 0.81*  CREATININE 1.42*  --  1.43*  --  1.40*  TROPONINIHS  --  2  --   --   --      Estimated Creatinine Clearance: 37 mL/min (A) (by C-G formula based on SCr of 1.4 mg/dL (H)).   Medical History: Past Medical History:  Diagnosis Date   Glaucoma    HOH (hard of hearing)    Hypertension     Medications:  Scheduled:   apixaban  10 mg Oral BID   Followed by   06/08/21 ON 06/13/2021] apixaban  5 mg Oral BID   brimonidine  1 drop Left Eye TID   cycloSPORINE  1 drop Both Eyes BID   dorzolamide-timolol  1 drop Both Eyes BID   feeding supplement  237 mL Oral BID BM   hydrALAZINE  25 mg Oral BID   latanoprost  1 drop Both Eyes QHS   metoCLOPramide (REGLAN) injection  5 mg Intravenous Q6H   mirtazapine  15 mg Oral QHS   multivitamin with minerals  1 tablet Oral Daily   pantoprazole  40 mg Oral Daily    Assessment: Pharmacy is consulted to dose heparin in 71 yo male diagnosed with bilateral PE. No noted anticoagulation on home med list. CT of chest shows Right lower, left lower, left upper lobe segmental pulmonaryemboli. No associated right heart strain or pulmonary infarction.  Today, 06/06/21 1:44 PM  Discussed with Dr. 06/08/21, ok to transition patient to a DOAC. Insurance authorization  completed and patient will be started on Eliquis.   Goal of Therapy:  Monitor platelets by anticoagulation protocol: Yes   Plan:  Discontinue heparin drip Initiate Eliquis 10mg  PO BID x 7 days, then 5mg  PO BID thereafter Daily CBC Monitor for signs and symptoms of bleeding  Ella Jubilee, PharmD  06/06/2021 1:44 PM

## 2021-06-06 NOTE — Evaluation (Signed)
Occupational Therapy Evaluation Patient Details Name: Edwin Wright MRN: [id number] DOB: [birth year] Today's Date: 06/06/2021    History of Present Illness Edwin Wright is a 70 y.o. male came to the ED with complaint of having hiccups which is causing him to feel short of breath and unable to sleep for last 2 days. Chest CT showed a right lower, left lower and left upper lobe segmental pulmonary emboli. Pt started on heparin and admitted. PMH: glaucoma, HTN, HOH.   Clinical Impression   PTA, pt lives with sister and typically Modified Independent with ADLs and mobility (use of cane only outside of home). Pt presents now with diagnoses above and deficits in strength, standing balance, and cardiopulmonary tolerance. Difficult to fully assess cognition due to pt very HOH with sister assisting in providing background information. Pt overall Min A for bed mobility and pivot to chair via handheld assist, noted difficulties due to new L LE pain (MD & RN aware). Pt requires Min A for UB ADLs and Mod A for LB ADLs due to deficits. Pt would likely benefit from RW use during ADLs in next session. Based on current abilities, recommend SNF rehab. Will continue to follow acutely and update recs if pt able to progress to HHOT.   SpO2 94% on RA with activity, HR 129bpm    Follow Up Recommendations  SNF    Equipment Recommendations  3 in 1 bedside commode;Other (comment) (Rolling walker)    Recommendations for Other Services       Precautions / Restrictions Precautions Precautions: Fall;Other (comment) Precaution Comments: very HOH, low vision Restrictions Weight Bearing Restrictions: No      Mobility Bed Mobility Overal bed mobility: Needs Assistance Bed Mobility: Supine to Sit     Supine to sit: Min assist;HOB elevated     General bed mobility comments: Min A to scoot hips forward. Pt self assisting L LE to EOB due to pain with movement    Transfers Overall transfer level: Needs  assistance Equipment used: 1 person hand held assist Transfers: Sit to/from [employer] Sit to Stand: Min assist Stand pivot transfers: Min assist       General transfer comment: Min A to rise and steady with kyphotic posture noted, frequent cues for sequencing (though difficulty following due to [facility]). Min A to maintain balance with pivoting to chair.    Balance Overall balance assessment: Needs assistance Sitting-balance support: No upper extremity supported;Feet supported Sitting balance-Leahy Scale: Fair     Standing balance support: Single extremity supported;During functional activity Standing balance-Leahy Scale: Poor Standing balance comment: reliant on at least one UE support                           ADL either performed or assessed with clinical judgement   ADL Overall ADL's : Needs assistance/impaired Eating/Feeding: Set up;Sitting Eating/Feeding Details (indicate cue type and reason): assist to open containers Grooming: Minimal assistance;Sitting   Upper Body Bathing: Minimal assistance;Sitting   Lower Body Bathing: Moderate assistance;Sit to/from stand   Upper Body Dressing : Minimal assistance;Sitting   Lower Body Dressing: Moderate assistance;Sit to/from stand Lower Body Dressing Details (indicate cue type and reason): likely to maintain balance in standing due to L LE pain. able to don slip on shoes with Min A sitting EOB Toilet Transfer: Minimal assistance;Stand-pivot Toilet Transfer Details (indicate cue type and reason): simulated to recliner Toileting- Clothing Manipulation and Hygiene: Moderate assistance;Sit to/from stand  General ADL Comments: Unable to accurately assess cognition due to impaired hearing, limited by L LE discomfort with movement/standing.     Vision Baseline Vision/History: Wears glasses;Legally blind;Glaucoma;Cataracts Wears Glasses:  (sister reports glasses do not help) Patient Visual Report:  No change from baseline Vision Assessment?: Vision impaired- to be further tested in functional context Additional Comments: hx of glaucoma and cataracts, legally blind. per sister, glasses do not help much and pt does fine getting around in familiar environment     Perception     Praxis      Pertinent Vitals/Pain Pain Assessment: Faces Faces Pain Scale: Hurts even more Pain Location: L LE with movement (unable to specify location), neck at end of session Pain Descriptors / Indicators: Grimacing;Guarding;Sore Pain Intervention(s): Monitored during session;Other (comment) (notified RN and MD)     Hand Dominance Right   Extremity/Trunk Assessment Upper Extremity Assessment Upper Extremity Assessment: Generalized weakness   Lower Extremity Assessment Lower Extremity Assessment: Defer to PT evaluation   Cervical / Trunk Assessment Cervical / Trunk Assessment: Other exceptions;Kyphotic Cervical / Trunk Exceptions: indicating some L lateral neck pain, reports this is new   Communication Communication Communication: HOH   Cognition Arousal/Alertness: Awake/alert Behavior During Therapy: Flat affect Overall Cognitive Status: Difficult to assess                                 General Comments: Difficult to assess cognition due to [facility] (even with hearing aides). Often answers "Ok" to most questions/statements, unable to answer orientation questions or consistently follow directions (likely hearing more so than cognition)   General Comments  Initially on 1 L O2 at 98%. Removed and trialed on RA with SpO2 94% with activity.    Exercises     Shoulder Instructions      Home Living Family/patient expects to be discharged to:: Private residence Living Arrangements: Other relatives (sister) Available Help at Discharge: Family;Other (Comment) (close to 24/7) Type of Home: House Home Access: Stairs to enter [employer] of Steps: 6 Entrance Stairs-Rails:  Right;Left Home Layout: One level     Bathroom Shower/Tub: Chief Strategy Officer: Handicapped height     Home Equipment: Grab bars - tub/shower;Cane - quad          Prior Functioning/Environment Level of Independence: Independent with assistive device(s)        Comments: ambulatory without AD in home, use of cane outside of home (per sister, cane more to assist with low vision/varying terrains). Able to complete ADLs, no falls per sister. Assist with IADLs        OT Problem List: Decreased strength;Decreased activity tolerance;Impaired balance (sitting and/or standing);Impaired vision/perception;Decreased knowledge of use of DME or AE;Cardiopulmonary status limiting activity;Pain      OT Treatment/Interventions: Self-care/ADL training;Therapeutic exercise;Energy conservation;DME and/or AE instruction;Therapeutic activities;Patient/family education;Balance training    OT Goals(Current goals can be found in the care plan section) Acute Rehab OT Goals Patient Stated Goal: sister would like for pt to recover well, return to normal functioning OT Goal Formulation: With patient/family Time For Goal Achievement: 06/20/21 Potential to Achieve Goals: Good  OT Frequency: Min 2X/week   Barriers to D/C:            Co-evaluation              AM-PAC OT "6 Clicks" Daily Activity     Outcome Measure Help from another person eating meals?: A Little Help  from another person taking care of personal grooming?: A Little Help from another person toileting, which includes using toliet, bedpan, or urinal?: A Lot Help from another person bathing (including washing, rinsing, drying)?: A Lot Help from another person to put on and taking off regular upper body clothing?: A Little Help from another person to put on and taking off regular lower body clothing?: A Lot 6 Click Score: 15   End of Session Equipment Utilized During Treatment: Gait belt;Oxygen Nurse Communication:  Mobility status;Other (comment) (L LE pain that is new, HOH strategies)  Activity Tolerance: Patient tolerated treatment well Patient left: in chair;with call bell/phone within reach;with chair alarm set;with family/visitor present  OT Visit Diagnosis: Other abnormalities of gait and mobility (R26.89);Unsteadiness on feet (R26.81);Muscle weakness (generalized) (M62.81);Pain Pain - Right/Left: Left Pain - part of body: Leg                Time: 0922-0949 OT Time Calculation (min): 27 min Charges:  OT General Charges $OT Visit: 1 Visit OT Evaluation $OT Eval Moderate Complexity: 1 Mod OT Treatments $Therapeutic Activity: 8-22 mins  Bradd Canary, OTR/L Acute Rehab Services Office: [phone]   Lorre Munroe 06/06/2021, 10:44 AM

## 2021-06-06 NOTE — Progress Notes (Signed)
ANTICOAGULATION CONSULT NOTE   Pharmacy Consult for IV heparin Indication: pulmonary embolus  Allergies  Allergen Reactions   Lactose Intolerance (Gi)     UNK reaction    Patient Measurements: Height: 5\' 10"  (177.8 cm) Weight: 54 kg (119 lb 0.8 oz) IBW/kg (Calculated) : 73 Heparin Dosing Weight: actual body weight  Vital Signs: Temp: 97.5 F (36.4 C) (07/21 0424) Temp Source: Oral (07/21 0424) BP: 129/60 (07/21 0424) Pulse Rate: 79 (07/21 0424)  Labs: Recent Labs    06/04/21 1318 06/04/21 1738 06/05/21 0415 06/05/21 1428 06/06/21 0523  HGB 11.7*  --  11.2*  --  11.0*  HCT 38.4*  --  37.2*  --  35.9*  PLT 186  --  184  --  205  HEPARINUNFRC  --   --   --  0.38 0.81*  CREATININE 1.42*  --  1.43*  --  1.40*  TROPONINIHS  --  2  --   --   --      Estimated Creatinine Clearance: 37 mL/min (A) (by C-G formula based on SCr of 1.4 mg/dL (H)).   Medical History: Past Medical History:  Diagnosis Date   Glaucoma    HOH (hard of hearing)    Hypertension     Medications:  Scheduled:   brimonidine  1 drop Left Eye TID   cycloSPORINE  1 drop Both Eyes BID   dorzolamide-timolol  1 drop Both Eyes BID   feeding supplement  237 mL Oral BID BM   hydrALAZINE  25 mg Oral BID   latanoprost  1 drop Both Eyes QHS   metoCLOPramide (REGLAN) injection  5 mg Intravenous Q6H   mirtazapine  15 mg Oral QHS   multivitamin with minerals  1 tablet Oral Daily   pantoprazole  40 mg Oral Daily    Assessment: Pharmacy is consulted to dose heparin in 70 yo male diagnosed with bilateral PE. No noted anticoagulation on home med list. CT of chest shows Right lower, left lower, left upper lobe segmental pulmonaryemboli. No associated right heart strain or pulmonary infarction.  Today, 06/06/21 6:47 AM  HL = 0.81 (supratherapeutic) with heparin gtt @ 900 units/hr Hgb 11 stable; Plt WNL No bleeding or infusion-related issues per RN  Goal of Therapy:  Heparin level 0.3-0.7  units/ml Monitor platelets by anticoagulation protocol: Yes   Plan:  Decrease heparin infusion to 800 units/hr Check heparin level 8 hours after heparin rate decreased Daily CBC while on heparin Monitor for signs and symptoms of bleeding  06/08/21, PharmD  06/06/2021 6:47 AM

## 2021-06-07 ENCOUNTER — Inpatient Hospital Stay (HOSPITAL_COMMUNITY): Payer: Medicare Other

## 2021-06-07 DIAGNOSIS — I2699 Other pulmonary embolism without acute cor pulmonale: Secondary | ICD-10-CM | POA: Diagnosis not present

## 2021-06-07 LAB — CBC
HCT: 38.1 % — ABNORMAL LOW (ref 39.0–52.0)
Hemoglobin: 11.4 g/dL — ABNORMAL LOW (ref 13.0–17.0)
MCH: 29.7 pg (ref 26.0–34.0)
MCHC: 29.9 g/dL — ABNORMAL LOW (ref 30.0–36.0)
MCV: 99.2 fL (ref 80.0–100.0)
Platelets: 233 10*3/uL (ref 150–400)
RBC: 3.84 MIL/uL — ABNORMAL LOW (ref 4.22–5.81)
RDW: 13.6 % (ref 11.5–15.5)
WBC: 11.7 10*3/uL — ABNORMAL HIGH (ref 4.0–10.5)
nRBC: 0 % (ref 0.0–0.2)

## 2021-06-07 MED ORDER — HYDRALAZINE HCL 25 MG PO TABS
25.0000 mg | ORAL_TABLET | Freq: Three times a day (TID) | ORAL | Status: DC
  Administered 2021-06-08 – 2021-06-13 (×13): 25 mg via ORAL
  Filled 2021-06-07 (×16): qty 1

## 2021-06-07 NOTE — Progress Notes (Signed)
   06/07/21 1328  Assess: MEWS Score  Temp (!) 101.6 F (38.7 C) (rn notified)  BP (!) 155/58  Pulse Rate (!) 109  Resp 19  SpO2 99 %  O2 Device Nasal Cannula  O2 Flow Rate (L/min) 2 L/min  Assess: MEWS Score  MEWS Temp 2  MEWS Systolic 0  MEWS Pulse 1  MEWS RR 0  MEWS LOC 0  MEWS Score 3  MEWS Score Color Yellow  Assess: if the MEWS score is Yellow or Red  Were vital signs taken at a resting state? Yes  Focused Assessment No change from prior assessment  Does the patient meet 2 or more of the SIRS criteria? Yes  Does the patient have a confirmed or suspected source of infection? No  MEWS guidelines implemented *See Row Information* Yes  Treat  MEWS Interventions Administered prn meds/treatments  Pain Scale 0-10  Pain Score Asleep  Take Vital Signs  Increase Vital Sign Frequency  Yellow: Q 2hr X 2 then Q 4hr X 2, if remains yellow, continue Q 4hrs  Escalate  MEWS: Escalate Yellow: discuss with charge nurse/RN and consider discussing with provider and RRT  Notify: Charge Nurse/RN  Name of Charge Nurse/RN Notified Philomena Doheny, RN  Date Charge Nurse/RN Notified 06/07/21  Time Charge Nurse/RN Notified 1348  Notify: Provider  Provider Name/Title Dr. Lucianne Muss  Date Provider Notified 06/07/21  Time Provider Notified 1346  Notification Type Page  Notification Reason Change in status  Provider response See new orders  Date of Provider Response 06/07/21  Time of Provider Response 1348  Document  Patient Outcome Stabilized after interventions  Progress note created (see row info) Yes  Assess: SIRS CRITERIA  SIRS Temperature  1  SIRS Pulse 1  SIRS Respirations  0  SIRS WBC 0  SIRS Score Sum  2   Vitals taken as seen above.  MD paged.  PRN given.  New orders given.  PRN was effective.  Patient resting quietly.  Will continue the yellow mews protocol at this time.  Levora Angel, RN

## 2021-06-07 NOTE — TOC Progression Note (Addendum)
Transition of Care [facility]) - Progression Note    Patient Details  Name: Breylon Sherrow MRN: [id number] Date of Birth: May 24, 1951  Transition [facility]) CM/SW Contact  Ida Rogue, [place] Phone Number: 06/07/2021, 3:38 PM  Clinical Narrative:   Went over bed offers with sister.  She would prefer not going out of town to [place], and would prefer not [facility] if possible.  She asked me to contact [facility] and [facility], neither of which made an offer, but also did not decline.  [facility] said no, [facility] is reviewing. Told her we would have to decide on Monday, even if it is not her first choice.  She verbalized understanding. TOC will continue to follow during the course of hospitalization.     Expected Discharge Plan: Skilled Nursing Facility Barriers to Discharge: SNF Pending bed offer  Expected Discharge Plan and Services Expected Discharge Plan: Skilled Nursing Facility   Discharge Planning Services: CM Consult Post Acute Care Choice: Skilled Nursing Facility Living arrangements for the past 2 months: Single Family Home                                       Social Determinants of Health (SDOH) Interventions    Readmission Risk Interventions No flowsheet data found.

## 2021-06-07 NOTE — Progress Notes (Signed)
PROGRESS NOTE    Edwin Wright  ZOX:[id number] DOB: 11/24/1950 DOA: 06/04/2021 PCP: Almetta LovelyHousecalls, [facility]   Brief Narrative:  This 70 years old male with PMH significant for hypertension , hard of hearing presented in the ED with complaints of shortness of breath and insomnia for 2 days.  Patient reports having hiccups at home associated with worsening shortness of breath.  CT chest shows right lower, left lower and left upper lobe segmental pulmonary embolism,  no right heart strain or pulmonary infarction noted.  Patient was started on heparin drip and subsequently transitioned to Eliquis.  Patient continued to remain hypoxic requiring supplemental oxygen.  Echocardiogram shows preserved RV systolic function.  Patient is very weak and deconditioned with persistent shortness of breath on exertion.  PT recommended  skilled nursing facility due to deconditioning.  Awaiting nursing home placement.  Assessment & Plan:   Principal Problem:   Bilateral pulmonary embolism (HCC) Active Problems:   Hypertension   ARF (acute renal failure) (HCC)   Iron deficiency anemia   Pulmonary embolism (HCC)   Acute hypoxic respiratory failure sec. to acute bilateral pulmonary embolism: New extensive left lower extremity DVT; Patient presented with tachypnea and hypoxia requiring supplemental oxygen.  He is at 2 L/min sats 98%. CTA chest shows multiple pulmonary embolus. Patient is started on IV heparin successfully transitioned to Eliquis. Echocardiogram shows preserved LV and RV function.   Lower extremity venous duplex shows extensive DVT in the left femoral vein and left popliteal vein left posterior tibial and left peroneal veins.  Continue adequate pain control with oxycodone. PT recommended skilled nursing facility.   COPD: No signs of acute clinical exacerbation Continue as needed bronchodilator therapy  Hypertension:  continue hydralazine for blood pressure control.   CKD stage  IIIa: Renal functions at baseline.  Continue to monitor renal functions.  Depression : continue Remeron   DVT prophylaxis: Eliquis Code Status: Full code. Family Communication: No family at bed side. Disposition Plan:   Status is: Inpatient  Remains inpatient appropriate because:Inpatient level of care appropriate due to severity of illness  Dispo: The patient is from: Home              Anticipated d/c is to: SNF              Patient currently is not medically stable to d/c.   Difficult to place patient No   Consultants:  None  Procedures:  CTA chest. Antimicrobials:   Anti-infectives (From admission, onward)    None       Subjective: Patient was seen and examined at bedside.  Overnight events noted.   Patient is lying comfortably on the bed,  reports intermittent shortness of breath. He denies any chest pain.  Objective: Vitals:   06/06/21 1103 06/06/21 1501 06/06/21 2048 06/07/21 0507  BP: (!) 165/87 (!) 148/68 (!) 124/57 (!) 152/52  Pulse: (!) 101 79 98 (!) 107  Resp:  (!) 22 18 20   Temp: (!) 97.4 F (36.3 C) 98.6 F (37 C) 98.9 F (37.2 C) 98 F (36.7 C)  TempSrc: Oral Oral Oral   SpO2: 98% 100% 100% 99%  Weight:      Height:        Intake/Output Summary (Last 24 hours) at 06/07/2021 1213 Last data filed at 06/07/2021 0817 Gross per 24 hour  Intake 543.9 ml  Output 2800 ml  Net -2256.1 ml   Filed Weights   06/04/21 1221  Weight: 54 kg    Examination:  General exam: Appears calm and comfortable, appears deconditioned, thin built. Respiratory system: Clear to auscultation. Respiratory effort normal. Cardiovascular system: S1 & S2 heard, RRR. No JVD, murmurs, rubs, gallops or clicks. No pedal edema. Gastrointestinal system: Abdomen is nondistended, soft and nontender. No organomegaly or masses felt. Normal bowel sounds heard. Central nervous system: Alert and oriented. No focal neurological deficits. Extremities: Symmetric 5 x 5 power.  No  edema, no cyanosis, no clubbing. Skin: No rashes, lesions or ulcers Psychiatry: Judgement and insight appear normal. Mood & affect appropriate.     Data Reviewed: I have personally reviewed following labs and imaging studies  CBC: Recent Labs  Lab 06/04/21 1318 06/05/21 0415 06/06/21 0523 06/07/21 0446  WBC 8.7 8.8 10.9* 11.7*  NEUTROABS 5.1  --   --   --   HGB 11.7* 11.2* 11.0* 11.4*  HCT 38.4* 37.2* 35.9* 38.1*  MCV 98.2 98.9 98.1 99.2  PLT 186 184 205 233   Basic Metabolic Panel: Recent Labs  Lab 06/04/21 1318 06/05/21 0415 06/06/21 0523  NA 140 139 137  K 4.3 4.0 3.9  CL 106 108 104  CO2 26 24 20*  GLUCOSE 98 91 82  BUN 13 11 13   CREATININE 1.42* 1.43* 1.40*  CALCIUM 9.3 8.6* 9.1   GFR: Estimated Creatinine Clearance: 37 mL/min (A) (by C-G formula based on SCr of 1.4 mg/dL (H)). Liver Function Tests: No results for input(s): AST, ALT, ALKPHOS, BILITOT, PROT, ALBUMIN in the last 168 hours. No results for input(s): LIPASE, AMYLASE in the last 168 hours. No results for input(s): AMMONIA in the last 168 hours. Coagulation Profile: No results for input(s): INR, PROTIME in the last 168 hours. Cardiac Enzymes: No results for input(s): CKTOTAL, CKMB, CKMBINDEX, TROPONINI in the last 168 hours. BNP (last 3 results) No results for input(s): PROBNP in the last 8760 hours. HbA1C: No results for input(s): HGBA1C in the last 72 hours. CBG: No results for input(s): GLUCAP in the last 168 hours. Lipid Profile: No results for input(s): CHOL, HDL, LDLCALC, TRIG, CHOLHDL, LDLDIRECT in the last 72 hours. Thyroid Function Tests: No results for input(s): TSH, T4TOTAL, FREET4, T3FREE, THYROIDAB in the last 72 hours. Anemia Panel: No results for input(s): VITAMINB12, FOLATE, FERRITIN, TIBC, IRON, RETICCTPCT in the last 72 hours. Sepsis Labs: No results for input(s): PROCALCITON, LATICACIDVEN in the last 168 hours.  Recent Results (from the past 240 hour(s))  Resp Panel by  RT-PCR (Flu A&B, Covid) Nasopharyngeal Swab     Status: None   Collection Time: 06/04/21  1:18 PM   Specimen: Nasopharyngeal Swab; Nasopharyngeal(NP) swabs in vial transport medium  Result Value Ref Range Status   SARS Coronavirus 2 by RT PCR NEGATIVE NEGATIVE Final    Comment: (NOTE) SARS-CoV-2 target nucleic acids are NOT DETECTED.  The SARS-CoV-2 RNA is generally detectable in upper respiratory specimens during the acute phase of infection. The lowest concentration of SARS-CoV-2 viral copies this assay can detect is 138 copies/mL. A negative result does not preclude SARS-Cov-2 infection and should not be used as the sole basis for treatment or other patient management decisions. A negative result may occur with  improper specimen collection/handling, submission of specimen other than nasopharyngeal swab, presence of viral mutation(s) within the areas targeted by this assay, and inadequate number of viral copies(<138 copies/mL). A negative result must be combined with clinical observations, patient history, and epidemiological information. The expected result is Negative.  Fact Sheet for Patients:  06/06/21  Fact Sheet for Healthcare Providers:  SeriousBroker.it  This test is no t yet approved or cleared by the [place] and  has been authorized for detection and/or diagnosis of SARS-CoV-2 by FDA under an Emergency Use Authorization (EUA). This EUA will remain  in effect (meaning this test can be used) for the duration of the COVID-19 declaration under Section 564(b)(1) of the Act, 21 U.S.C.section 360bbb-3(b)(1), unless the authorization is terminated  or revoked sooner.       Influenza A by PCR NEGATIVE NEGATIVE Final   Influenza B by PCR NEGATIVE NEGATIVE Final    Comment: (NOTE) The Xpert Xpress SARS-CoV-2/FLU/RSV plus assay is intended as an aid in the diagnosis of influenza from Nasopharyngeal swab  specimens and should not be used as a sole basis for treatment. Nasal washings and aspirates are unacceptable for Xpert Xpress SARS-CoV-2/FLU/RSV testing.  Fact Sheet for Patients: BloggerCourse.com  Fact Sheet for Healthcare Providers: SeriousBroker.it  This test is not yet approved or cleared by the [place] FDA and has been authorized for detection and/or diagnosis of SARS-CoV-2 by FDA under an Emergency Use Authorization (EUA). This EUA will remain in effect (meaning this test can be used) for the duration of the COVID-19 declaration under Section 564(b)(1) of the Act, 21 U.S.C. section 360bbb-3(b)(1), unless the authorization is terminated or revoked.  Performed at [facility], [address], [place], [place] [address]     Radiology Studies: DG Knee 1-2 Views Left  Result Date: 06/06/2021 CLINICAL DATA:  Left hip, knee, and ankle pain. EXAM: LEFT KNEE - 1-2 VIEW COMPARISON:  None. FINDINGS: No fracture. Normal alignment. Minimal medial tibiofemoral joint space narrowing. There is medial and lateral tibiofemoral chondrocalcinosis. Trace joint effusion. No erosion, bone destruction or focal bone abnormality. The bones are subjectively under mineralized. IMPRESSION: Mild medial tibiofemoral joint space narrowing and chondrocalcinosis. Trace joint effusion. 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 No erosive change. Electronically Signed   By: Narda Rutherford M.D.   On: 06/06/2021 16:14   DG Ankle 2 Views Left  Result Date: 06/06/2021 CLINICAL DATA:  Left hip, knee, and ankle pain. EXAM: LEFT ANKLE - 2 VIEW COMPARISON:  None. FINDINGS: There is no evidence of fracture, dislocation, or joint effusion. Ankle mortise is preserved. There is no evidence of arthropathy or other focal bone abnormality. Soft tissues are unremarkable. IMPRESSION: Negative radiographs of the left ankle. Electronically Signed   By:  Narda Rutherford M.D.   On: 06/06/2021 16:17   DG HIP UNILAT WITH PELVIS 2-3 VIEWS LEFT  Result Date: 06/06/2021 CLINICAL DATA:  Left hip, knee, and ankle pain. EXAM: DG HIP (WITH OR WITHOUT PELVIS) 2-3V LEFT COMPARISON:  None. FINDINGS: Bones are subjectively under mineralized. Left hip joint space is preserved. There is minimal lateral acetabular spurring and chondrocalcinosis. No fracture, avascular necrosis, periosteal reaction or bone destruction. Pubic rami are intact. Pubic symphysis is congruent with chondrocalcinosis. The sacroiliac joints are congruent. IMPRESSION: 1. 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 No acute osseous abnormality. 2. Osteopenia/osteoporosis. Electronically Signed   By: Narda Rutherford M.D.   On: 06/06/2021 16:13    Scheduled Meds:  apixaban  10 mg Oral BID   Followed by   Melene Muller ON 06/13/2021] apixaban  5 mg Oral BID   brimonidine  1 drop Left Eye TID   cycloSPORINE  1 drop Both Eyes BID   dorzolamide-timolol  1 drop Both Eyes BID   feeding supplement  237 mL Oral BID BM   hydrALAZINE  25 mg Oral  BID   latanoprost  1 drop Both Eyes QHS   metoCLOPramide (REGLAN) injection  5 mg Intravenous Q6H   mirtazapine  15 mg Oral QHS   multivitamin with minerals  1 tablet Oral Daily   pantoprazole  40 mg Oral Daily   Continuous Infusions:  promethazine (PHENERGAN) injection (IM or IVPB) 6.25 mg (06/07/21 0315)     LOS: 2 days    Time spent: 35 mins    Amiera Herzberg, MD [facility]   If 7PM-7AM, please contact night-coverage

## 2021-06-07 NOTE — Progress Notes (Signed)
Physical Therapy Treatment Patient Details Name: Edwin Wright MRN: [id number] DOB: 1951-11-12 Today's Date: 06/07/2021    History of Present Illness Edwin Wright is a 70 y.o. male came to the ED with complaint of having hiccups which is causing him to feel short of breath and unable to sleep for last 2 days. Chest CT showed a right lower, left lower and left upper lobe segmental pulmonary emboli. Pt started on heparin and admitted. PMH: glaucoma, HTN, HOH.   **06/06/21: pt found to have lower extremity edema with positive deep vein thrombosis involving the left femoral vein, left popliteal vein, left posterior tibial veins and left peroneal veins**    PT Comments    Pt continues to be limited with mobility by L LE pain and required increased assist for all mobility today compared to evaluation yesterday. MOD A +2 for power up and safety when performing lateral side steps today due to inability to adequately WB through L leg. Pt's sister present during session and stated that she is interested in looking into SNF facility options as she is unable to provide the increased assist that the pt needs at this time.PT educated pt and his sister on performance of there ex outside of PT sessions for promotion of LE ROM/strength and improved circulation. Pt will benefit from skilled PT to increase their independence and safety with mobility to allow discharge to the venue listed below.     Follow Up Recommendations  SNF     Equipment Recommendations  Rolling walker with 5" wheels    Recommendations for Other Services       Precautions / Restrictions Precautions Precautions: Fall Precaution Comments: very HOH, low vision (Pt is able to hear better at times when PT was speaking close to L ear) Restrictions Weight Bearing Restrictions: No    Mobility  Bed Mobility Overal bed mobility: Needs Assistance Bed Mobility: Sit to Supine;Supine to Sit     Supine to sit: HOB elevated;Min assist;+2 for  physical assistance;+2 for safety/equipment Sit to supine: Min assist;+2 for safety/equipment;+2 for physical assistance   General bed mobility comments: MIN A +2 for progression of LEs and to bring trunk to upright with increased time. Pt required MIN-MOD A for seated balance. Improved throughout session with multimodal cues and use of B UEs to maintain seated balance.    Transfers Overall transfer level: Needs assistance Equipment used: Rolling walker (2 wheeled);2 person hand held assist Transfers: Sit to/from Stand Sit to Stand: +2 safety/equipment;Mod assist;+2 physical assistance;From elevated surface         General transfer comment: x2 from EOB; MOD A +2 for power up to rise with use of RW and cues for safe hand placment. Multimodal cues for upright posture, pt noted to be kyphotic in standing with reported LB pain. MOD A +2 for stability. pt took x3 lateral side steps toward HOB. Pt unsteady and requiring facilitation from PT for R ft progression due to increased pain in L LE pain with WB.  Ambulation/Gait                 Stairs             Wheelchair Mobility    Modified Rankin (Stroke Patients Only)       Balance Overall balance assessment: Needs assistance Sitting-balance support: No upper extremity supported;Feet supported Sitting balance-Leahy Scale: Poor Sitting balance - Comments: pt required MIN-MOD A for midline seated balance or B UE support to maintain today   Standing balance  support: Bilateral upper extremity supported Standing balance-Leahy Scale: Poor Standing balance comment: reliant on external support to maintain standing balance                            Cognition Arousal/Alertness: Awake/alert Behavior During Therapy: WFL for tasks assessed/performed Overall Cognitive Status: Difficult to assess                                 General Comments: Pt requiring multiple verbal, tactile, and visual cues for  command following during session, expect due to [facility].      Exercises General Exercises - Lower Extremity Ankle Circles/Pumps: AAROM;Right;10 reps;Supine Heel Slides: AAROM;Right;5 reps;Supine Hip ABduction/ADduction: AAROM;Right;5 reps;Left;Supine    General Comments General comments (skin integrity, edema, etc.): HR MAX of 138bpm during session. Pt on 2L [place] at beginning of session with O2 sat at 99% during initial mobility. PT placed pt on 1.5L [place] sats remaining at 98% throughout session, RN aware.      Pertinent Vitals/Pain Pain Assessment: Faces Faces Pain Scale: Hurts whole lot Pain Location: pt complaining of L neck pain and L LE pain in calf with movement and touch. Also reported R LB pain in standing. Pain Descriptors / Indicators: Grimacing;Guarding;Sore Pain Intervention(s): Limited activity within patient's tolerance;Monitored during session;Repositioned;Premedicated before session    Home Living                      Prior Function            PT Goals (current goals can now be found in the care plan section) Acute Rehab PT Goals Patient Stated Goal: sister would like for pt to recover well, return to normal functioning PT Goal Formulation: With patient/family Time For Goal Achievement: 06/20/21 Potential to Achieve Goals: Good Progress towards PT goals: PT to reassess next treatment (Pt requiring increased assist this session compared to eval)    Frequency    Min 2X/week      PT Plan Current plan remains appropriate    Co-evaluation              AM-PAC PT "6 Clicks" Mobility   Outcome Measure  Help needed turning from your back to your side while in a flat bed without using bedrails?: A Lot Help needed moving from lying on your back to sitting on the side of a flat bed without using bedrails?: A Lot Help needed moving to and from a bed to a chair (including a wheelchair)?: Total Help needed standing up from a chair using your arms (e.g.,  wheelchair or bedside chair)?: Total Help needed to walk in hospital room?: Total Help needed climbing 3-5 steps with a railing? : Total 6 Click Score: 8    End of Session Equipment Utilized During Treatment: Gait belt Activity Tolerance: Patient limited by pain Patient left: in bed;with call bell/phone within reach;with family/visitor present;with bed alarm set Nurse Communication: Mobility status PT Visit Diagnosis: Unsteadiness on feet (R26.81);Muscle weakness (generalized) (M62.81);Difficulty in walking, not elsewhere classified (R26.2);Pain Pain - Right/Left: Left Pain - part of body: Leg     Time: 1761-6073 PT Time Calculation (min) (ACUTE ONLY): 34 min  Charges:  $Therapeutic Activity: 23-37 mins                     Lyman Speller PT, DPT  Acute Rehabilitation Services  Office  [phone]   06/07/2021, 12:42 PM

## 2021-06-08 DIAGNOSIS — I2699 Other pulmonary embolism without acute cor pulmonale: Secondary | ICD-10-CM | POA: Diagnosis not present

## 2021-06-08 LAB — URINALYSIS, ROUTINE W REFLEX MICROSCOPIC
Bilirubin Urine: NEGATIVE
Glucose, UA: NEGATIVE mg/dL
Ketones, ur: NEGATIVE mg/dL
Leukocytes,Ua: NEGATIVE
Nitrite: NEGATIVE
Protein, ur: NEGATIVE mg/dL
Specific Gravity, Urine: 1.009 (ref 1.005–1.030)
pH: 6 (ref 5.0–8.0)

## 2021-06-08 LAB — BASIC METABOLIC PANEL
Anion gap: 9 (ref 5–15)
BUN: 18 mg/dL (ref 8–23)
CO2: 24 mmol/L (ref 22–32)
Calcium: 9.2 mg/dL (ref 8.9–10.3)
Chloride: 99 mmol/L (ref 98–111)
Creatinine, Ser: 1.59 mg/dL — ABNORMAL HIGH (ref 0.61–1.24)
GFR, Estimated: 46 mL/min — ABNORMAL LOW (ref >60)
Glucose, Bld: 109 mg/dL — ABNORMAL HIGH (ref 70–99)
Potassium: 4.3 mmol/L (ref 3.5–5.1)
Sodium: 132 mmol/L — ABNORMAL LOW (ref 135–145)

## 2021-06-08 LAB — CBC
HCT: 35.5 % — ABNORMAL LOW (ref 39.0–52.0)
Hemoglobin: 10.8 g/dL — ABNORMAL LOW (ref 13.0–17.0)
MCH: 29.6 pg (ref 26.0–34.0)
MCHC: 30.4 g/dL (ref 30.0–36.0)
MCV: 97.3 fL (ref 80.0–100.0)
Platelets: 254 10*3/uL (ref 150–400)
RBC: 3.65 MIL/uL — ABNORMAL LOW (ref 4.22–5.81)
RDW: 13.8 % (ref 11.5–15.5)
WBC: 18.1 10*3/uL — ABNORMAL HIGH (ref 4.0–10.5)
nRBC: 0 % (ref 0.0–0.2)

## 2021-06-08 LAB — MAGNESIUM: Magnesium: 1.9 mg/dL (ref 1.7–2.4)

## 2021-06-08 LAB — PROCALCITONIN: Procalcitonin: 1.58 ng/mL

## 2021-06-08 LAB — PHOSPHORUS: Phosphorus: 2.7 mg/dL (ref 2.5–4.6)

## 2021-06-08 MED ORDER — SODIUM CHLORIDE 0.9 % IV BOLUS
500.0000 mL | Freq: Once | INTRAVENOUS | Status: AC
  Administered 2021-06-08: 500 mL via INTRAVENOUS

## 2021-06-08 MED ORDER — SODIUM CHLORIDE 0.9 % IV SOLN
1.5000 g | Freq: Four times a day (QID) | INTRAVENOUS | Status: DC
  Administered 2021-06-08 – 2021-06-11 (×12): 1.5 g via INTRAVENOUS
  Filled 2021-06-08 (×5): qty 1.5
  Filled 2021-06-08: qty 4
  Filled 2021-06-08 (×3): qty 1.5
  Filled 2021-06-08 (×2): qty 4
  Filled 2021-06-08 (×2): qty 1.5

## 2021-06-08 NOTE — Progress Notes (Signed)
   06/08/21 1335  Assess: MEWS Score  Temp 98.2 F (36.8 C)  BP (!) 132/55  Pulse Rate (!) 104  Resp (!) 24  SpO2 98 %  O2 Device Nasal Cannula  Assess: MEWS Score  MEWS Temp 0  MEWS Systolic 0  MEWS Pulse 1  MEWS RR 1  MEWS LOC 0  MEWS Score 2  MEWS Score Color Yellow  Assess: if the MEWS score is Yellow or Red  Were vital signs taken at a resting state? Yes  Focused Assessment No change from prior assessment  Does the patient meet 2 or more of the SIRS criteria? Yes  Does the patient have a confirmed or suspected source of infection? No  MEWS guidelines implemented *See Row Information* Yes  Treat  MEWS Interventions Administered scheduled meds/treatments  Pain Scale 0-10  Pain Score 0  Faces Pain Scale 0  Take Vital Signs  Increase Vital Sign Frequency  Yellow: Q 2hr X 2 then Q 4hr X 2, if remains yellow, continue Q 4hrs  Escalate  MEWS: Escalate Yellow: discuss with charge nurse/RN and consider discussing with provider and RRT  Notify: Charge Nurse/RN  Name of Charge Nurse/RN Notified Toniann Fail RN  Date Charge Nurse/RN Notified 06/08/21  Time Charge Nurse/RN Notified 1340  Notify: Provider  Provider Name/Title Dr. Lucianne Muss  Date Provider Notified 06/08/21  Time Provider Notified 1400  Notification Type Page  Notification Reason Other (Comment)  Provider response Other (Comment)  Date of Provider Response 06/08/21  Document  Patient Outcome Stabilized after interventions  Assess: SIRS CRITERIA  SIRS Temperature  0  SIRS Pulse 1  SIRS Respirations  1  SIRS WBC 0  SIRS Score Sum  2

## 2021-06-08 NOTE — Progress Notes (Signed)
PROGRESS NOTE    Edwin Wright  ZGY:[id number] DOB: 08-Feb-1951 DOA: 06/04/2021 PCP: Almetta Lovely, [facility]   Brief Narrative:  This 70 years old male with PMH significant for hypertension , hard of hearing presented in the ED with complaints of shortness of breath and insomnia for 2 days.  Patient reports having hiccups at home associated with worsening shortness of breath.  CT chest shows right lower, left lower and left upper lobe segmental pulmonary embolism,  no right heart strain or pulmonary infarction noted.  Patient was started on heparin drip and subsequently transitioned to Eliquis.  Patient continued to remain hypoxic requiring supplemental oxygen.  Echocardiogram shows preserved RV systolic function.  Patient is very weak and deconditioned with persistent shortness of breath on exertion.  PT recommended  skilled nursing facility due to deconditioning.  Awaiting nursing home placement.  Assessment & Plan:   Principal Problem:   Bilateral pulmonary embolism (HCC) Active Problems:   Hypertension   ARF (acute renal failure) (HCC)   Iron deficiency anemia   Pulmonary embolism (HCC)   Acute hypoxic respiratory failure sec. to acute bilateral pulmonary embolism: New extensive left lower extremity DVT; Patient presented with tachypnea and hypoxia requiring supplemental oxygen.  He is at 2 L/min sats 98%. CTA chest shows multiple pulmonary embolus. Patient is started on IV heparin successfully transitioned to Eliquis. Echocardiogram shows preserved LV and RV function.   Lower extremity venous duplex shows extensive DVT in the left femoral vein and left popliteal vein left posterior tibial and left peroneal veins.  Continue adequate pain control with oxycodone. PT recommended skilled nursing facility.   COPD: No signs of acute clinical exacerbation. Continue as needed bronchodilator therapy  Hypertension:  Continue hydralazine for blood pressure control.   AKI on CKD stage  IIIa: Renal functions were at baseline. Serum creatinine slightly up today, Continue to monitor renal functions. Start cautious hydration.  Depression : continue Remeron  Fever :  Patient has spiked fever 101.6 on 7/22. UA unremarkable, chest x-ray no infiltrate. Blood and urine culture sent.  WBC up to 18K Patient might have aspirated, start Unasyn for 3 days. Obtain procalcitonin level ,  follow blood cultures.   DVT prophylaxis: Eliquis Code Status: Full code. Family Communication: No family at bed side. Disposition Plan:   Status is: Inpatient  Remains inpatient appropriate because:Inpatient level of care appropriate due to severity of illness  Dispo: The patient is from: Home              Anticipated d/c is to: SNF              Patient currently is not medically stable to d/c.   Difficult to place patient No   Consultants:  None  Procedures:  CTA chest. Antimicrobials:   Anti-infectives (From admission, onward)    Start     Dose/Rate Route Frequency Ordered Stop   06/08/21 1000  ampicillin-sulbactam (UNASYN) 1.5 g in sodium chloride 0.9 % 100 mL IVPB        1.5 g 200 mL/hr over 30 Minutes Intravenous Every 6 hours 06/08/21 0824         Subjective: Patient was seen and examined at bedside.  Overnight events noted.   Patient is lying comfortably on the bed,  reports still has intermittent shortness of breath. He denies any chest pain, feels comparatively better, denies any pain.  Objective: Vitals:   06/07/21 1728 06/07/21 2103 06/08/21 0116 06/08/21 0427  BP: (!) 115/53 (!) 108/49 126/81 138/64  Pulse: 100 89 91 (!) 103  Resp: 18 13 16 16   Temp: 98.1 F (36.7 C) 97.6 F (36.4 C) 97.6 F (36.4 C) 97.9 F (36.6 C)  TempSrc: Oral Oral Oral   SpO2: 98% 100% 100% 100%  Weight:      Height:        Intake/Output Summary (Last 24 hours) at 06/08/2021 1318 Last data filed at 06/08/2021 1239 Gross per 24 hour  Intake 690 ml  Output 1475 ml  Net -785 ml    Filed Weights   06/04/21 1221  Weight: 54 kg    Examination:  General exam: Appears deconditioned, thin built, Lying comfortable. Respiratory system: Clear to auscultation. Respiratory effort normal. Cardiovascular system: S1 & S2 heard, RRR. No JVD, murmurs, rubs, gallops or clicks. No pedal edema. Gastrointestinal system: Abdomen is nondistended, soft and nontender. No organomegaly or masses felt. Normal bowel sounds heard. Central nervous system: Alert and oriented. No focal neurological deficits. Extremities:  No edema, no cyanosis, no clubbing. Skin: No rashes, lesions or ulcers Psychiatry: Judgement and insight appear normal. Mood & affect appropriate.     Data Reviewed: I have personally reviewed following labs and imaging studies  CBC: Recent Labs  Lab 06/04/21 1318 06/05/21 0415 06/06/21 0523 06/07/21 0446 06/08/21 0510  WBC 8.7 8.8 10.9* 11.7* 18.1*  NEUTROABS 5.1  --   --   --   --   HGB 11.7* 11.2* 11.0* 11.4* 10.8*  HCT 38.4* 37.2* 35.9* 38.1* 35.5*  MCV 98.2 98.9 98.1 99.2 97.3  PLT 186 184 205 233 254   Basic Metabolic Panel: Recent Labs  Lab 06/04/21 1318 06/05/21 0415 06/06/21 0523 06/08/21 0510  NA 140 139 137 132*  K 4.3 4.0 3.9 4.3  CL 106 108 104 99  CO2 26 24 20* 24  GLUCOSE 98 91 82 109*  BUN 13 11 13 18   CREATININE 1.42* 1.43* 1.40* 1.59*  CALCIUM 9.3 8.6* 9.1 9.2  MG  --   --   --  1.9  PHOS  --   --   --  2.7   GFR: Estimated Creatinine Clearance: 32.5 mL/min (A) (by C-G formula based on SCr of 1.59 mg/dL (H)). Liver Function Tests: No results for input(s): AST, ALT, ALKPHOS, BILITOT, PROT, ALBUMIN in the last 168 hours. No results for input(s): LIPASE, AMYLASE in the last 168 hours. No results for input(s): AMMONIA in the last 168 hours. Coagulation Profile: No results for input(s): INR, PROTIME in the last 168 hours. Cardiac Enzymes: No results for input(s): CKTOTAL, CKMB, CKMBINDEX, TROPONINI in the last 168 hours. BNP  (last 3 results) No results for input(s): PROBNP in the last 8760 hours. HbA1C: No results for input(s): HGBA1C in the last 72 hours. CBG: No results for input(s): GLUCAP in the last 168 hours. Lipid Profile: No results for input(s): CHOL, HDL, LDLCALC, TRIG, CHOLHDL, LDLDIRECT in the last 72 hours. Thyroid Function Tests: No results for input(s): TSH, T4TOTAL, FREET4, T3FREE, THYROIDAB in the last 72 hours. Anemia Panel: No results for input(s): VITAMINB12, FOLATE, FERRITIN, TIBC, IRON, RETICCTPCT in the last 72 hours. Sepsis Labs: No results for input(s): PROCALCITON, LATICACIDVEN in the last 168 hours.  Recent Results (from the past 240 hour(s))  Resp Panel by RT-PCR (Flu A&B, Covid) Nasopharyngeal Swab     Status: None   Collection Time: 06/04/21  1:18 PM   Specimen: Nasopharyngeal Swab; Nasopharyngeal(NP) swabs in vial transport medium  Result Value Ref Range Status   SARS Coronavirus 2  by RT PCR NEGATIVE NEGATIVE Final    Comment: (NOTE) SARS-CoV-2 target nucleic acids are NOT DETECTED.  The SARS-CoV-2 RNA is generally detectable in upper respiratory specimens during the acute phase of infection. The lowest concentration of SARS-CoV-2 viral copies this assay can detect is 138 copies/mL. A negative result does not preclude SARS-Cov-2 infection and should not be used as the sole basis for treatment or other patient management decisions. A negative result may occur with  improper specimen collection/handling, submission of specimen other than nasopharyngeal swab, presence of viral mutation(s) within the areas targeted by this assay, and inadequate number of viral copies(<138 copies/mL). A negative result must be combined with clinical observations, patient history, and epidemiological information. The expected result is Negative.  Fact Sheet for Patients:  BloggerCourse.com  Fact Sheet for Healthcare Providers:   SeriousBroker.it  This test is no t yet approved or cleared by the [place] FDA and  has been authorized for detection and/or diagnosis of SARS-CoV-2 by FDA under an Emergency Use Authorization (EUA). This EUA will remain  in effect (meaning this test can be used) for the duration of the COVID-19 declaration under Section 564(b)(1) of the Act, 21 U.S.C.section 360bbb-3(b)(1), unless the authorization is terminated  or revoked sooner.       Influenza A by PCR NEGATIVE NEGATIVE Final   Influenza B by PCR NEGATIVE NEGATIVE Final    Comment: (NOTE) The Xpert Xpress SARS-CoV-2/FLU/RSV plus assay is intended as an aid in the diagnosis of influenza from Nasopharyngeal swab specimens and should not be used as a sole basis for treatment. Nasal washings and aspirates are unacceptable for Xpert Xpress SARS-CoV-2/FLU/RSV testing.  Fact Sheet for Patients: BloggerCourse.com  Fact Sheet for Healthcare Providers: SeriousBroker.it  This test is not yet approved or cleared by the [place] FDA and has been authorized for detection and/or diagnosis of SARS-CoV-2 by FDA under an Emergency Use Authorization (EUA). This EUA will remain in effect (meaning this test can be used) for the duration of the COVID-19 declaration under Section 564(b)(1) of the Act, 21 U.S.C. section 360bbb-3(b)(1), unless the authorization is terminated or revoked.  Performed at [facility], [address] [place], [place] [address]   Culture, blood (routine x 2)     Status: None (Preliminary result)   Collection Time: 06/07/21  2:38 PM   Specimen: BLOOD  Result Value Ref Range Status   Specimen Description   Final    BLOOD RIGHT ANTECUBITAL Performed at [facility], [address], [place], [place] [address]    Special Requests   Final    BOTTLES DRAWN AEROBIC AND ANAEROBIC Blood Culture  adequate volume Performed at [facility], [address] [place], [place] [address]    Culture   Final    NO GROWTH < 12 HOURS Performed at [facility], [address], [place], [place] [address]    Report Status PENDING  Incomplete  Culture, blood (routine x 2)     Status: None (Preliminary result)   Collection Time: 06/07/21  2:48 PM   Specimen: BLOOD  Result Value Ref Range Status   Specimen Description   Final    BLOOD BLOOD RIGHT FOREARM Performed at [facility], [address] [place], [place] [address]    Special Requests   Final    BOTTLES DRAWN AEROBIC AND ANAEROBIC Blood Culture adequate volume Performed at [facility], [address], [place], [place] [address]  Culture   Final    NO GROWTH < 12 HOURS Performed at [facility], [address] [place], [place] [address]    Report Status PENDING  Incomplete    Radiology Studies: DG Knee 1-2 Views Left  Result Date: 06/06/2021 CLINICAL DATA:  Left hip, knee, and ankle pain. EXAM: LEFT KNEE - 1-2 VIEW COMPARISON:  None. FINDINGS: No fracture. Normal alignment. Minimal medial tibiofemoral joint space narrowing. There is medial and lateral tibiofemoral chondrocalcinosis. Trace joint effusion. No erosion, bone destruction or focal bone abnormality. The bones are subjectively under mineralized. IMPRESSION: Mild medial tibiofemoral joint space narrowing and chondrocalcinosis. Trace joint effusion. 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 No erosive change. Electronically Signed   By: Narda Rutherford M.D.   On: 06/06/2021 16:14   DG Ankle 2 Views Left  Result Date: 06/06/2021 CLINICAL DATA:  Left hip, knee, and ankle pain. EXAM: LEFT ANKLE - 2 VIEW COMPARISON:  None. FINDINGS: There is no evidence of fracture, dislocation, or joint effusion. Ankle mortise is preserved. There is no evidence of arthropathy or other focal bone abnormality. Soft tissues are  unremarkable. IMPRESSION: Negative radiographs of the left ankle. Electronically Signed   By: Narda Rutherford M.D.   On: 06/06/2021 16:17   DG CHEST PORT 1 VIEW  Result Date: 06/07/2021 CLINICAL DATA:  70 year old male with clinical concern for pneumonia, history of recent pulmonary embolism. EXAM: PORTABLE CHEST - 1 VIEW COMPARISON:  06/04/2021 FINDINGS: The mediastinal contours are within normal limits. No cardiomegaly. Lungs are hyperinflated. The lungs are clear bilaterally without evidence of focal consolidation, pleural effusion, or pneumothorax. Atherosclerotic calcifications of the aortic arch. No acute osseous abnormality. IMPRESSION: No acute cardiopulmonary process. Aortic Atherosclerosis (ICD10-I70.0) and Emphysema (ICD10-J43.9). Electronically Signed   By: Marliss Coots MD   On: 06/07/2021 15:57   DG HIP UNILAT WITH PELVIS 2-3 VIEWS LEFT  Result Date: 06/06/2021 CLINICAL DATA:  Left hip, knee, and ankle pain. EXAM: DG HIP (WITH OR WITHOUT PELVIS) 2-3V LEFT COMPARISON:  None. FINDINGS: Bones are subjectively under mineralized. Left hip joint space is preserved. There is minimal lateral acetabular spurring and chondrocalcinosis. No fracture, avascular necrosis, periosteal reaction or bone destruction. Pubic rami are intact. Pubic symphysis is congruent with chondrocalcinosis. The sacroiliac joints are congruent. IMPRESSION: 1. 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 No acute osseous abnormality. 2. Osteopenia/osteoporosis. Electronically Signed   By: Narda Rutherford M.D.   On: 06/06/2021 16:13    Scheduled Meds:  apixaban  10 mg Oral BID   Followed by   Melene Muller ON 06/13/2021] apixaban  5 mg Oral BID   brimonidine  1 drop Left Eye TID   cycloSPORINE  1 drop Both Eyes BID   dorzolamide-timolol  1 drop Both Eyes BID   feeding supplement  237 mL Oral BID BM   hydrALAZINE  25 mg Oral Q8H   latanoprost  1 drop Both Eyes QHS   metoCLOPramide (REGLAN) injection   5 mg Intravenous Q6H   mirtazapine  15 mg Oral QHS   multivitamin with minerals  1 tablet Oral Daily   pantoprazole  40 mg Oral Daily   Continuous Infusions:  ampicillin-sulbactam (UNASYN) IV     promethazine (PHENERGAN) injection (IM or IVPB) 6.25 mg (06/07/21 0315)     LOS: 3 days    Time spent: 25 mins    Irelynd Zumstein, MD [facility]   If 7PM-7AM, please contact night-coverage

## 2021-06-08 NOTE — Progress Notes (Signed)
   06/08/21 1733  Assess: MEWS Score  Temp 98.6 F (37 C)  BP (!) 131/50  Pulse Rate (!) 117  Resp (!) 24  SpO2 98 %  O2 Device Nasal Cannula  Assess: MEWS Score  MEWS Temp 0  MEWS Systolic 0  MEWS Pulse 2  MEWS RR 1  MEWS LOC 0  MEWS Score 3  MEWS Score Color Yellow  Assess: if the MEWS score is Yellow or Red  Were vital signs taken at a resting state? Yes  Focused Assessment No change from prior assessment  Does the patient meet 2 or more of the SIRS criteria? Yes  Does the patient have a confirmed or suspected source of infection? No  MEWS guidelines implemented *See Row Information* No, previously yellow, continue vital signs every 4 hours  Treat  MEWS Interventions Other (Comment) (Repositited patient for comfort)  Pain Scale 0-10  Pain Score 0  Faces Pain Scale 0  Take Vital Signs  Increase Vital Sign Frequency  Yellow: Q 2hr X 2 then Q 4hr X 2, if remains yellow, continue Q 4hrs  Escalate  MEWS: Escalate Yellow: discuss with charge nurse/RN and consider discussing with provider and RRT  Notify: Charge Nurse/RN  Name of Charge Nurse/RN Notified Toniann Fail RN  Date Charge Nurse/RN Notified 06/08/21  Time Charge Nurse/RN Notified 1753  Notify: Provider  Provider Name/Title Dr. Lucianne Muss  Date Provider Notified 06/08/21  Time Provider Notified 1753  Notification Type Page  Notification Reason Other (Comment)  Provider response Other (Comment)  Date of Provider Response 06/08/21  Document  Patient Outcome Other (Comment) (Stable remains on unit)  Progress note created (see row info) Yes  Assess: SIRS CRITERIA  SIRS Temperature  0  SIRS Pulse 1  SIRS Respirations  1  SIRS WBC 0  SIRS Score Sum  2

## 2021-06-08 NOTE — Progress Notes (Signed)
   06/08/21 1536  Assess: MEWS Score  Temp 98.2 F (36.8 C)  BP (!) 124/55  Pulse Rate (!) 114  Resp 20  SpO2 99 %  O2 Device Nasal Cannula  Assess: MEWS Score  MEWS Temp 0  MEWS Systolic 0  MEWS Pulse 2  MEWS RR 0  MEWS LOC 0  MEWS Score 2  MEWS Score Color Yellow  Assess: if the MEWS score is Yellow or Red  Were vital signs taken at a resting state? Yes  Focused Assessment No change from prior assessment  Does the patient meet 2 or more of the SIRS criteria? Yes  Does the patient have a confirmed or suspected source of infection? No  MEWS guidelines implemented *See Row Information* No, previously yellow, continue vital signs every 4 hours  Treat  MEWS Interventions Administered scheduled meds/treatments  Pain Scale 0-10  Pain Score 0  Faces Pain Scale 0  Take Vital Signs  Increase Vital Sign Frequency  Yellow: Q 2hr X 2 then Q 4hr X 2, if remains yellow, continue Q 4hrs  Escalate  MEWS: Escalate Yellow: discuss with charge nurse/RN and consider discussing with provider and RRT  Notify: Charge Nurse/RN  Name of Charge Nurse/RN Notified Toniann Fail RN  Date Charge Nurse/RN Notified 06/08/21  Time Charge Nurse/RN Notified 1540  Notify: Provider  Provider Name/Title Dr. Lucianne Muss  Date Provider Notified 06/08/21  Time Provider Notified 1540  Notification Type Page  Notification Reason Other (Comment)  Provider response No new orders  Date of Provider Response 06/08/21  Document  Patient Outcome Stabilized after interventions  Assess: SIRS CRITERIA  SIRS Temperature  0  SIRS Pulse 1  SIRS Respirations  0  SIRS WBC 0  SIRS Score Sum  1

## 2021-06-09 DIAGNOSIS — I2699 Other pulmonary embolism without acute cor pulmonale: Secondary | ICD-10-CM

## 2021-06-09 LAB — BASIC METABOLIC PANEL
Anion gap: 14 (ref 5–15)
BUN: 17 mg/dL (ref 8–23)
CO2: 25 mmol/L (ref 22–32)
Calcium: 9.5 mg/dL (ref 8.9–10.3)
Chloride: 97 mmol/L — ABNORMAL LOW (ref 98–111)
Creatinine, Ser: 1.44 mg/dL — ABNORMAL HIGH (ref 0.61–1.24)
GFR, Estimated: 52 mL/min — ABNORMAL LOW (ref >60)
Glucose, Bld: 124 mg/dL — ABNORMAL HIGH (ref 70–99)
Potassium: 4 mmol/L (ref 3.5–5.1)
Sodium: 136 mmol/L (ref 135–145)

## 2021-06-09 LAB — URINE CULTURE

## 2021-06-09 LAB — CBC
HCT: 33.9 % — ABNORMAL LOW (ref 39.0–52.0)
Hemoglobin: 10.5 g/dL — ABNORMAL LOW (ref 13.0–17.0)
MCH: 30.1 pg (ref 26.0–34.0)
MCHC: 31 g/dL (ref 30.0–36.0)
MCV: 97.1 fL (ref 80.0–100.0)
Platelets: 317 10*3/uL (ref 150–400)
RBC: 3.49 MIL/uL — ABNORMAL LOW (ref 4.22–5.81)
RDW: 14.3 % (ref 11.5–15.5)
WBC: 17.1 10*3/uL — ABNORMAL HIGH (ref 4.0–10.5)
nRBC: 0 % (ref 0.0–0.2)

## 2021-06-09 MED ORDER — SODIUM CHLORIDE 0.9 % IV SOLN
25.0000 mg | Freq: Four times a day (QID) | INTRAVENOUS | Status: AC
  Administered 2021-06-09 – 2021-06-11 (×5): 25 mg via INTRAVENOUS
  Filled 2021-06-09 (×9): qty 1

## 2021-06-09 MED ORDER — METOPROLOL TARTRATE 12.5 MG HALF TABLET
12.5000 mg | ORAL_TABLET | Freq: Two times a day (BID) | ORAL | Status: DC
  Administered 2021-06-09 – 2021-06-13 (×9): 12.5 mg via ORAL
  Filled 2021-06-09 (×9): qty 1

## 2021-06-09 NOTE — Progress Notes (Addendum)
PROGRESS NOTE    Edwin Wright  UJW:[id number] DOB: 06/20/1951 DOA: 06/04/2021 PCP: Almetta LovelyHousecalls, [facility]   Brief Narrative:  This 70 years old male with PMH significant for hypertension , hard of hearing presented in the ED with complaints of shortness of breath and insomnia for 2 days.  Patient reports having hiccups at home associated with worsening shortness of breath.  CT chest shows right lower, left lower and left upper lobe segmental pulmonary embolism,  no right heart strain or pulmonary infarction noted.  Patient was started on heparin drip and subsequently transitioned to Eliquis.  Patient continued to remain hypoxic requiring supplemental oxygen.  Echocardiogram shows preserved RV systolic function.  Patient is very weak and deconditioned with persistent shortness of breath on exertion.  PT recommended  skilled nursing facility due to deconditioning.  Awaiting nursing home placement.  Assessment & Plan:   Principal Problem:   Bilateral pulmonary embolism (HCC) Active Problems:   Hypertension   ARF (acute renal failure) (HCC)   Iron deficiency anemia   Pulmonary embolism (HCC)  Acute hypoxic respiratory failure sec. to acute bilateral pulmonary embolism: New extensive left lower extremity DVT; Patient presented with tachypnea and hypoxia requiring supplemental oxygen.He is on 2 L/min sats 98%. CTA chest shows multiple pulmonary embolus. Patient was started on IV heparin successfully transitioned to Eliquis. Echocardiogram shows preserved LV and RV function.   Lower extremity venous duplex shows extensive DVT in the left femoral vein and left popliteal vein left posterior tibial and left peroneal veins.  Continue adequate pain control with oxycodone. PT recommended skilled nursing facility.  COPD: No signs of acute clinical exacerbation. Continue as needed bronchodilator therapy  Hypertension:  Continue hydralazine for blood pressure control.   AKI on CKD stage  IIIa: Renal functions were at baseline.  Continue to monitor renal functions. Continue cautious hydration.  Depression : continue Remeron  Fever could be aspiration Pneumonia:  Patient has spiked fever 101.6 on 7/22. UA unremarkable, chest x-ray no infiltrate. Blood and urine culture sent.  WBC up to 18K Patient might have aspirated, continue Unasyn for 3 days. Procalcitonin 1.58, urine culture contaminated, follow blood cultures   DVT prophylaxis: Eliquis Code Status: Full code. Family Communication: No family at bed side. Disposition Plan:   Status is: Inpatient  Remains inpatient appropriate because:Inpatient level of care appropriate due to severity of illness  Dispo: The patient is from: Home              Anticipated d/c is to: SNF              Patient currently is not medically stable to d/c.   Difficult to place patient No   Consultants:  None  Procedures:  CTA chest. Antimicrobials:   Anti-infectives (From admission, onward)    Start     Dose/Rate Route Frequency Ordered Stop   06/08/21 1000  ampicillin-sulbactam (UNASYN) 1.5 g in sodium chloride 0.9 % 100 mL IVPB        1.5 g 200 mL/hr over 30 Minutes Intravenous Every 6 hours 06/08/21 0824         Subjective: Patient was seen and examined at bedside.  Overnight events noted.   Patient is lying comfortably on the bed, having hiccups, reports still having shortness of breath. He also reports having neck pain,  asks for stronger pain medications.  Objective: Vitals:   06/08/21 2100 06/09/21 0122 06/09/21 0141 06/09/21 0533  BP: (!) 124/57 (!) 180/84 (!) 136/59 (!) 163/66  Pulse: Marland Kitchen(!)  115 (!) 110 100 (!) 107  Resp: (!) 24 16 20 20   Temp: 98.1 F (36.7 C) 98.2 F (36.8 C)  98.3 F (36.8 C)  TempSrc: Oral Oral  Oral  SpO2: 99% 100% 96% 98%  Weight:      Height:        Intake/Output Summary (Last 24 hours) at 06/09/2021 1151 Last data filed at 06/09/2021 0900 Gross per 24 hour  Intake 440 ml  Output  350 ml  Net 90 ml   Filed Weights   06/04/21 1221  Weight: 54 kg    Examination:  General exam: Appears deconditioned, thin built, Lying comfortable, having hiccups. Respiratory system: Clear to auscultation. Respiratory effort normal. Cardiovascular system: S1 & S2 heard, RRR. No JVD, murmurs, rubs, gallops or clicks. No pedal edema. Gastrointestinal system: Abdomen is nondistended, soft and nontender. No organomegaly or masses felt. Normal bowel sounds heard. Central nervous system: Alert and oriented x 2. No focal neurological deficits. Extremities:  No edema, no cyanosis, no clubbing. Skin: No rashes, lesions or ulcers Psychiatry: Judgement and insight appear normal. Mood & affect appropriate.     Data Reviewed: I have personally reviewed following labs and imaging studies  CBC: Recent Labs  Lab 06/04/21 1318 06/05/21 0415 06/06/21 0523 06/07/21 0446 06/08/21 0510 06/09/21 0605  WBC 8.7 8.8 10.9* 11.7* 18.1* 17.1*  NEUTROABS 5.1  --   --   --   --   --   HGB 11.7* 11.2* 11.0* 11.4* 10.8* 10.5*  HCT 38.4* 37.2* 35.9* 38.1* 35.5* 33.9*  MCV 98.2 98.9 98.1 99.2 97.3 97.1  PLT 186 184 205 233 254 317   Basic Metabolic Panel: Recent Labs  Lab 06/04/21 1318 06/05/21 0415 06/06/21 0523 06/08/21 0510 06/09/21 0605  NA 140 139 137 132* 136  K 4.3 4.0 3.9 4.3 4.0  CL 106 108 104 99 97*  CO2 26 24 20* 24 25  GLUCOSE 98 91 82 109* 124*  BUN 13 11 13 18 17   CREATININE 1.42* 1.43* 1.40* 1.59* 1.44*  CALCIUM 9.3 8.6* 9.1 9.2 9.5  MG  --   --   --  1.9  --   PHOS  --   --   --  2.7  --    GFR: Estimated Creatinine Clearance: 35.9 mL/min (A) (by C-G formula based on SCr of 1.44 mg/dL (H)). Liver Function Tests: No results for input(s): AST, ALT, ALKPHOS, BILITOT, PROT, ALBUMIN in the last 168 hours. No results for input(s): LIPASE, AMYLASE in the last 168 hours. No results for input(s): AMMONIA in the last 168 hours. Coagulation Profile: No results for input(s):  INR, PROTIME in the last 168 hours. Cardiac Enzymes: No results for input(s): CKTOTAL, CKMB, CKMBINDEX, TROPONINI in the last 168 hours. BNP (last 3 results) No results for input(s): PROBNP in the last 8760 hours. HbA1C: No results for input(s): HGBA1C in the last 72 hours. CBG: No results for input(s): GLUCAP in the last 168 hours. Lipid Profile: No results for input(s): CHOL, HDL, LDLCALC, TRIG, CHOLHDL, LDLDIRECT in the last 72 hours. Thyroid Function Tests: No results for input(s): TSH, T4TOTAL, FREET4, T3FREE, THYROIDAB in the last 72 hours. Anemia Panel: No results for input(s): VITAMINB12, FOLATE, FERRITIN, TIBC, IRON, RETICCTPCT in the last 72 hours. Sepsis Labs: Recent Labs  Lab 06/08/21 0824  PROCALCITON 1.58    Recent Results (from the past 240 hour(s))  Resp Panel by RT-PCR (Flu A&B, Covid) Nasopharyngeal Swab     Status: None  Collection Time: 06/04/21  1:18 PM   Specimen: Nasopharyngeal Swab; Nasopharyngeal(NP) swabs in vial transport medium  Result Value Ref Range Status   SARS Coronavirus 2 by RT PCR NEGATIVE NEGATIVE Final    Comment: (NOTE) SARS-CoV-2 target nucleic acids are NOT DETECTED.  The SARS-CoV-2 RNA is generally detectable in upper respiratory specimens during the acute phase of infection. The lowest concentration of SARS-CoV-2 viral copies this assay can detect is 138 copies/mL. A negative result does not preclude SARS-Cov-2 infection and should not be used as the sole basis for treatment or other patient management decisions. A negative result may occur with  improper specimen collection/handling, submission of specimen other than nasopharyngeal swab, presence of viral mutation(s) within the areas targeted by this assay, and inadequate number of viral copies(<138 copies/mL). A negative result must be combined with clinical observations, patient history, and epidemiological information. The expected result is Negative.  Fact Sheet for  Patients:  BloggerCourse.com  Fact Sheet for Healthcare Providers:  SeriousBroker.it  This test is no t yet approved or cleared by the [place] FDA and  has been authorized for detection and/or diagnosis of SARS-CoV-2 by FDA under an Emergency Use Authorization (EUA). This EUA will remain  in effect (meaning this test can be used) for the duration of the COVID-19 declaration under Section 564(b)(1) of the Act, 21 U.S.C.section 360bbb-3(b)(1), unless the authorization is terminated  or revoked sooner.       Influenza A by PCR NEGATIVE NEGATIVE Final   Influenza B by PCR NEGATIVE NEGATIVE Final    Comment: (NOTE) The Xpert Xpress SARS-CoV-2/FLU/RSV plus assay is intended as an aid in the diagnosis of influenza from Nasopharyngeal swab specimens and should not be used as a sole basis for treatment. Nasal washings and aspirates are unacceptable for Xpert Xpress SARS-CoV-2/FLU/RSV testing.  Fact Sheet for Patients: BloggerCourse.com  Fact Sheet for Healthcare Providers: SeriousBroker.it  This test is not yet approved or cleared by the [place] FDA and has been authorized for detection and/or diagnosis of SARS-CoV-2 by FDA under an Emergency Use Authorization (EUA). This EUA will remain in effect (meaning this test can be used) for the duration of the COVID-19 declaration under Section 564(b)(1) of the Act, 21 U.S.C. section 360bbb-3(b)(1), unless the authorization is terminated or revoked.  Performed at [facility], [address] [place], [place] [address]   Culture, blood (routine x 2)     Status: None (Preliminary result)   Collection Time: 06/07/21  2:38 PM   Specimen: BLOOD  Result Value Ref Range Status   Specimen Description   Final    BLOOD RIGHT ANTECUBITAL Performed at [facility], [address] [place], [place]  [address]    Special Requests   Final    BOTTLES DRAWN AEROBIC AND ANAEROBIC Blood Culture adequate volume Performed at [facility], [address], [place], [place] [address]    Culture   Final    NO GROWTH 2 DAYS Performed at [facility], [address] [place], [place] [address]    Report Status PENDING  Incomplete  Culture, blood (routine x 2)     Status: None (Preliminary result)   Collection Time: 06/07/21  2:48 PM   Specimen: BLOOD  Result Value Ref Range Status   Specimen Description   Final    BLOOD BLOOD RIGHT FOREARM Performed at [facility], [address] [place], [place] [address]    Special Requests  Final    BOTTLES DRAWN AEROBIC AND ANAEROBIC Blood Culture adequate volume Performed at [facility], [address], [place], [place] [address]    Culture   Final    NO GROWTH 2 DAYS Performed at [facility], [address], [place], [place] [address]    Report Status PENDING  Incomplete  Urine Culture     Status: Abnormal   Collection Time: 06/08/21 12:15 AM   Specimen: Urine, Clean Catch  Result Value Ref Range Status   Specimen Description   Final    URINE, CLEAN CATCH Performed at [facility], [address] [place], [place] [address]    Special Requests   Final    NONE Performed at [facility], [address] [place] [address], [place], [place] [address]    Culture MULTIPLE SPECIES PRESENT, SUGGEST RECOLLECTION (A)  Final   Report Status 06/09/2021 FINAL  Final    Radiology Studies: DG CHEST PORT 1 VIEW  Result Date: 06/07/2021 CLINICAL DATA:  70 year old male with clinical concern for pneumonia, history of recent pulmonary embolism. EXAM: PORTABLE CHEST - 1 VIEW COMPARISON:  06/04/2021 FINDINGS: The mediastinal contours are within normal limits. No cardiomegaly. Lungs are hyperinflated. The lungs are clear bilaterally without evidence of focal consolidation,  pleural effusion, or pneumothorax. Atherosclerotic calcifications of the aortic arch. No acute osseous abnormality. IMPRESSION: No acute cardiopulmonary process. Aortic Atherosclerosis (ICD10-I70.0) and Emphysema (ICD10-J43.9). Electronically Signed   By: Marliss Coots MD   On: 06/07/2021 15:57    Scheduled Meds:  apixaban  10 mg Oral BID   Followed by   Melene Muller ON 06/13/2021] apixaban  5 mg Oral BID   brimonidine  1 drop Left Eye TID   cycloSPORINE  1 drop Both Eyes BID   dorzolamide-timolol  1 drop Both Eyes BID   feeding supplement  237 mL Oral BID BM   hydrALAZINE  25 mg Oral Q8H   latanoprost  1 drop Both Eyes QHS   metoCLOPramide (REGLAN) injection  5 mg Intravenous Q6H   metoprolol tartrate  12.5 mg Oral BID   mirtazapine  15 mg Oral QHS   multivitamin with minerals  1 tablet Oral Daily   pantoprazole  40 mg Oral Daily   Continuous Infusions:  ampicillin-sulbactam (UNASYN) IV 1.5 g (06/09/21 0954)   promethazine (PHENERGAN) injection (IM or IVPB) 6.25 mg (06/09/21 1120)     LOS: 4 days    Time spent: 25 mins    Etheleen Valtierra, MD [facility]   If 7PM-7AM, please contact night-coverage

## 2021-06-09 NOTE — TOC Progression Note (Signed)
Transition of Care [facility]) - Progression Note    Patient Details  Name: Edwin Wright MRN: [id number] Date of Birth: 1951-03-05  Transition of Care [facility]) CM/SW Contact  Darleene Cleaver, [place] Phone Number: 06/09/2021, 5:11 PM  Clinical Narrative:     CSW attempted to call patient's sister, had to leave a message on voice mail.  Insurance authorization will have to be started with facility choice.   Expected Discharge Plan: Skilled Nursing Facility Barriers to Discharge: SNF Pending bed offer  Expected Discharge Plan and Services Expected Discharge Plan: Skilled Nursing Facility   Discharge Planning Services: CM Consult Post Acute Care Choice: Skilled Nursing Facility Living arrangements for the past 2 months: Single Family Home                                       Social Determinants of Health (SDOH) Interventions    Readmission Risk Interventions No flowsheet data found.

## 2021-06-09 NOTE — Progress Notes (Signed)
   06/09/21 1420  Assess: MEWS Score  Temp 97.8 F (36.6 C)  BP 138/72  Pulse Rate 88  Resp 20  SpO2 98 %  O2 Device Nasal Cannula  O2 Flow Rate (L/min) 2 L/min  Assess: MEWS Score  MEWS Temp 0  MEWS Systolic 0  MEWS Pulse 0  MEWS RR 0  MEWS LOC 0  MEWS Score 0  MEWS Score Color Green  Assess: if the MEWS score is Yellow or Red  Were vital signs taken at a resting state? Yes  Focused Assessment No change from prior assessment  Does the patient meet 2 or more of the SIRS criteria? Yes  Does the patient have a confirmed or suspected source of infection? No  MEWS guidelines implemented *See Row Information* No, other (Comment) (Patient resting comfortably, Vitals stable)  Treat  MEWS Interventions Other (Comment);Administered prn meds/treatments (Patient resting comfortably)  Pain Scale 0-10  Pain Score 3  Faces Pain Scale 0  Pain Type Acute pain  Pain Location Neck  Pain Orientation Left  Pain Descriptors / Indicators Aching  Pain Frequency Intermittent  Pain Onset On-going  Patients Stated Pain Goal 0  Pain Intervention(s) Repositioned  Take Vital Signs  Increase Vital Sign Frequency  Yellow: Q 2hr X 2 then Q 4hr X 2, if remains yellow, continue Q 4hrs  Escalate  MEWS: Escalate Yellow: discuss with charge nurse/RN and consider discussing with provider and RRT  Notify: Charge Nurse/RN  Name of Charge Nurse/RN Notified Toniann Fail RN  Date Charge Nurse/RN Notified 06/09/21  Time Charge Nurse/RN Notified 1420  Notify: Provider  Provider Name/Title Dr. Lucianne Muss  Date Provider Notified 06/09/21  Time Provider Notified 1430  Notification Type Face-to-face  Provider response Other (Comment) (Code status changed)  Date of Provider Response 06/09/21  Time of Provider Response 1430  Document  Patient Outcome Stabilized after interventions  Assess: SIRS CRITERIA  SIRS Temperature  0  SIRS Pulse 0  SIRS Respirations  0  SIRS WBC 1  SIRS Score Sum  1  Patient back in green,  resting comfortably.

## 2021-06-09 NOTE — Consult Note (Signed)
   NAME:  Edwin Wright, MRN:  [id number], DOB:  1951/03/11, LOS: 4 ADMISSION DATE:  06/04/2021, CONSULTATION DATE:  06/09/21 REFERRING MD:  Dr Lucianne Muss, CHIEF COMPLAINT:  SOB   History of Present Illness:  Asked to see patient for shortness of breath Has been having intractable hiccups  Came in with shortness of breath and insomnia of 2 days duration, found to have a pulmonary embolism, DVT for which he is on anticoagulation  Severe deconditioning at baseline  Pertinent  Medical History   Past Medical History:  Diagnosis Date   Glaucoma    HOH (hard of hearing)    Hypertension    Significant Hospital Events: Including procedures, antibiotic start and stop dates in addition to other pertinent events   CT scan showing pulmonary embolism  Interim History / Subjective:  Hiccups Maintaining saturations Mild tachycardia  Objective   Blood pressure (!) 143/64, pulse 98, temperature 98.3 F (36.8 C), temperature source Oral, resp. rate 20, height 5\' 10"  (1.778 m), weight 54 kg, SpO2 99 %.        Intake/Output Summary (Last 24 hours) at 06/09/2021 1324 Last data filed at 06/09/2021 0900 Gross per 24 hour  Intake 320 ml  Output 350 ml  Net -30 ml   Filed Weights   06/04/21 1221  Weight: 54 kg    Examination: General: Elderly, not following commands HENT: Dry oral mucosa Lungs: Clear breath sounds bilaterally, decreased air movement Cardiovascular: S1-S2 appreciated Abdomen: Soft Extremities: No clubbing Neuro: Awake, not following commands GU:   Resolved Hospital Problem list     Assessment & Plan:  DVT, pulmonary embolism -On anticoagulation -No evidence of right ventricular strain  Intractable hiccups -On Reglan -Phenergan -We will start on Compazine 25 IV every 6 for 48 hours  Underlying history of obstructive lung disease -Continue bronchodilator treatment  Hypertension -Continue blood pressure meds  Acute kidney injury on chronic kidney disease -Replete  electrolytes -Avoid nephrotoxic's  Possible aspiration pneumonia on 7/22 -On Unasyn -Complete course of treatment  Continue close monitoring Focus on getting his hiccups under control Does not need ICU level care at present   8/22, MD [place] [facility] Pager: See Virl Diamond

## 2021-06-09 NOTE — Progress Notes (Signed)
   06/09/21 1220  Assess: MEWS Score  Temp 98.3 F (36.8 C)  BP (!) 143/64  Pulse Rate 98  Resp 20  SpO2 99 %  O2 Device Nasal Cannula  O2 Flow Rate (L/min) 2 L/min  Assess: MEWS Score  MEWS Temp 0  MEWS Systolic 0  MEWS Pulse 0  MEWS RR 0  MEWS LOC 0  MEWS Score 0  MEWS Score Color Green  Assess: if the MEWS score is Yellow or Red  Were vital signs taken at a resting state? Yes  Focused Assessment Change from prior assessment (see assessment flowsheet)  Does the patient meet 2 or more of the SIRS criteria? Yes  Does the patient have a confirmed or suspected source of infection? No  MEWS guidelines implemented *See Row Information* Yes  Treat  MEWS Interventions Administered prn meds/treatments  Pain Scale 0-10  Pain Score 0  Faces Pain Scale 0  Take Vital Signs  Increase Vital Sign Frequency  Yellow: Q 2hr X 2 then Q 4hr X 2, if remains yellow, continue Q 4hrs  Escalate  MEWS: Escalate Yellow: discuss with charge nurse/RN and consider discussing with provider and RRT  Notify: Charge Nurse/RN  Name of Charge Nurse/RN Notified Toniann Fail RN  Date Charge Nurse/RN Notified 06/09/21  Time Charge Nurse/RN Notified 1309  Notify: Provider  Provider Name/Title Dr. Lucianne Muss  Date Provider Notified 06/09/21  Time Provider Notified 1309  Notification Type Page  Provider response See new orders  Date of Provider Response 06/09/21  Time of Provider Response 1309  Document  Patient Outcome Other (Comment) (Consult for ICU)  Assess: SIRS CRITERIA  SIRS Temperature  0  SIRS Pulse 1  SIRS Respirations  0  SIRS WBC 1  SIRS Score Sum  2  Patient's breathing remains labored. Patient denies pain. PRN medications given. Consult for ICU per MD.

## 2021-06-10 DIAGNOSIS — I2699 Other pulmonary embolism without acute cor pulmonale: Secondary | ICD-10-CM | POA: Diagnosis not present

## 2021-06-10 LAB — BASIC METABOLIC PANEL
Anion gap: 11 (ref 5–15)
BUN: 19 mg/dL (ref 8–23)
CO2: 24 mmol/L (ref 22–32)
Calcium: 8.7 mg/dL — ABNORMAL LOW (ref 8.9–10.3)
Chloride: 100 mmol/L (ref 98–111)
Creatinine, Ser: 1.35 mg/dL — ABNORMAL HIGH (ref 0.61–1.24)
GFR, Estimated: 56 mL/min — ABNORMAL LOW (ref >60)
Glucose, Bld: 116 mg/dL — ABNORMAL HIGH (ref 70–99)
Potassium: 3.8 mmol/L (ref 3.5–5.1)
Sodium: 135 mmol/L (ref 135–145)

## 2021-06-10 LAB — CBC
HCT: 27.7 % — ABNORMAL LOW (ref 39.0–52.0)
Hemoglobin: 8.7 g/dL — ABNORMAL LOW (ref 13.0–17.0)
MCH: 29.9 pg (ref 26.0–34.0)
MCHC: 31.4 g/dL (ref 30.0–36.0)
MCV: 95.2 fL (ref 80.0–100.0)
Platelets: 346 10*3/uL (ref 150–400)
RBC: 2.91 MIL/uL — ABNORMAL LOW (ref 4.22–5.81)
RDW: 14.5 % (ref 11.5–15.5)
WBC: 14 10*3/uL — ABNORMAL HIGH (ref 4.0–10.5)
nRBC: 0 % (ref 0.0–0.2)

## 2021-06-10 LAB — HEMOGLOBIN AND HEMATOCRIT, BLOOD
HCT: 25.5 % — ABNORMAL LOW (ref 39.0–52.0)
Hemoglobin: 7.9 g/dL — ABNORMAL LOW (ref 13.0–17.0)

## 2021-06-10 MED ORDER — SODIUM CHLORIDE 0.9 % IV BOLUS
500.0000 mL | Freq: Once | INTRAVENOUS | Status: AC
  Administered 2021-06-10: 500 mL via INTRAVENOUS

## 2021-06-10 MED ORDER — SODIUM CHLORIDE 0.9 % IV SOLN: INTRAVENOUS | Status: DC | PRN

## 2021-06-10 NOTE — Progress Notes (Signed)
   NAME:  Edwin Wright, MRN:  [id number], DOB:  03-Nov-1951, LOS: 5 ADMISSION DATE:  06/04/2021, CONSULTATION DATE:  06/09/21 REFERRING MD:  Dr Lucianne Muss, CHIEF COMPLAINT: Shortness of breath  History of Present Illness:  Asked to see patient for shortness of breath Has been having intractable hiccups   Came in with shortness of breath and insomnia of 2 days duration, found to have a pulmonary embolism, DVT for which he is on anticoagulation   Severe deconditioning at baseline  Pertinent  Medical History   Past Medical History:  Diagnosis Date   Glaucoma    HOH (hard of hearing)    Hypertension      Significant Hospital Events: Including procedures, antibiotic start and stop dates in addition to other pertinent events   CT with pulmonary embolism  Interim History / Subjective:  Hiccups appears to have improved  Objective   Blood pressure (!) 125/43, pulse (!) 115, temperature 98.9 F (37.2 C), temperature source Oral, resp. rate 20, height 5\' 10"  (1.778 m), weight 54 kg, SpO2 98 %.        Intake/Output Summary (Last 24 hours) at 06/10/2021 0843 Last data filed at 06/10/2021 0441 Gross per 24 hour  Intake 1164.08 ml  Output 950 ml  Net 214.08 ml   Filed Weights   06/04/21 1221  Weight: 54 kg    Examination: General: Elderly gentleman, very frail HENT: Dry oral mucosa Lungs: Clear breath sounds, decreased air movement Cardiovascular: S1-S2 appreciated Abdomen: Soft, bowel sounds appreciated Extremities: No clubbing, no edema Neuro: Awake, follows simple commands GU: Fair output  Resolved Hospital Problem list     Assessment & Plan:  DVT pulmonary embolism -On anticoagulation  Intractable hiccups -Appears to be better -On Reglan, Phenergan -Compazine added  -If hiccups are better, de-escalate  Hypertension Acute kidney on chronic kidney disease Possible aspiration pneumonia -May transition to oral Augmentin if he continues to take orally  Will sign  off  06/06/21, MD [place] [facility] Pager: See Virl Diamond

## 2021-06-10 NOTE — Progress Notes (Addendum)
Provider Notification:   06/09/21 2128  Vitals  BP (!) 116/51  BP Location Left Arm  BP Method Automatic  Patient Position (if appropriate) Lying  Pulse Rate (!) 107  Pulse Rate Source Dinamap  MEWS COLOR  MEWS Score Color Green  Pain Assessment  Pain Scale PAINAD  PAINAD (Pain Assessment in Advanced Dementia)  Breathing 0  Negative Vocalization 0  Facial Expression 0  Body Language 0  Consolability 0  PAINAD Score 0  MEWS Score  MEWS Temp 0  MEWS Systolic 0  MEWS Pulse 1  MEWS RR 0  MEWS LOC 0  MEWS Score 1  Provider Notification  Provider Name/Title Linton Flemings  Date Provider Notified 06/09/21  Time Provider Notified 2130  Notification Type Page  Notification Reason Other (Comment) (notification of BP / scheduled BP meds due at this time (metoprolol and hydralazine))  Provider response See new orders (set order parameters for adminstration of BP meds)  Date of Provider Response 06/09/21  Time of Provider Response 2140

## 2021-06-10 NOTE — Care Management Important Message (Signed)
Important Message  Patient Details IM Letter given to the Patient. Name: Edwin Wright MRN: [id number] Date of Birth: 26-Sep-1951   Medicare Important Message Given:  Yes     Caren Macadam 06/10/2021, 11:37 AM

## 2021-06-10 NOTE — Progress Notes (Signed)
   06/10/21 0558  Assess: MEWS Score  Temp 98.2 F (36.8 C)  BP (!) 107/43  Pulse Rate (!) 126 (reassessment 122 at 0616)  Resp 20  Level of Consciousness Alert  SpO2 100 %  O2 Device Nasal Cannula  O2 Flow Rate (L/min) 2 L/min  Assess: MEWS Score  MEWS Temp 0  MEWS Systolic 0  MEWS Pulse 2  MEWS RR 0  MEWS LOC 0  MEWS Score 2  MEWS Score Color Yellow  Assess: if the MEWS score is Yellow or Red  Were vital signs taken at a resting state? Yes  Focused Assessment No change from prior assessment  Does the patient meet 2 or more of the SIRS criteria? No  Does the patient have a confirmed or suspected source of infection? No  MEWS guidelines implemented *See Row Information* No, other (Comment) (baseline tachycardia, takes metoprolol BID)  Treat  Pain Score Asleep  Notify: Charge Nurse/RN  Name of Charge Nurse/RN Notified Vera RN  Date Charge Nurse/RN Notified 06/10/21  Time Charge Nurse/RN Notified 0610  Assess: SIRS CRITERIA  SIRS Temperature  0  SIRS Pulse 1  SIRS Respirations  0  SIRS WBC 0  SIRS Score Sum  1   Patient resting well, spoke with the charge RN Vera about baseline tachycardia. Pt does not meet two or more of SIRS criteria. Will continue to monitor. am Hydralazine dose held this am per the parameters set by X. Blount.

## 2021-06-10 NOTE — TOC Progression Note (Signed)
Transition of Care [facility]) - Progression Note    Patient Details  Name: Edwin Wright MRN: [id number] Date of Birth: 1951-03-04  Transition of Care [facility]) CM/SW Contact  Darleene Cleaver, [place] Phone Number: 06/10/2021, 5:51 PM  Clinical Narrative:     CSW spoke to patient's sister Nicole Cella and provided bed offers.  They chose [facility], CSW spoke to [facility], they can accept patient pending insurance auth and once medically ready for discharge.  TOC to continue to follow patient's progress throughout discharge planning.   Expected Discharge Plan: Skilled Nursing Facility Barriers to Discharge: SNF Pending bed offer  Expected Discharge Plan and Services Expected Discharge Plan: Skilled Nursing Facility   Discharge Planning Services: CM Consult Post Acute Care Choice: Skilled Nursing Facility Living arrangements for the past 2 months: Single Family Home                                       Social Determinants of Health (SDOH) Interventions    Readmission Risk Interventions No flowsheet data found.

## 2021-06-10 NOTE — Progress Notes (Signed)
Patient in the yellow MEWS at the beginning of the shift. Re-assessed patient and VS. Patient still in the yellow MEWS. Yellow MEWS protocol initiated. Notified charge nurse Christena Deem, RN. Will continue to monitor patient.   06/10/21 0755  Assess: MEWS Score  Temp 98.9 F (37.2 C)  BP (!) 125/43  Pulse Rate (!) 115  Resp 20  SpO2 98 %  O2 Device Nasal Cannula  O2 Flow Rate (L/min) 2 L/min  Assess: MEWS Score  MEWS Temp 0  MEWS Systolic 0  MEWS Pulse 2  MEWS RR 0  MEWS LOC 0  MEWS Score 2  MEWS Score Color Yellow  Assess: if the MEWS score is Yellow or Red  Were vital signs taken at a resting state? Yes  Focused Assessment No change from prior assessment  Does the patient meet 2 or more of the SIRS criteria? No  Does the patient have a confirmed or suspected source of infection? No  MEWS guidelines implemented *See Row Information* Yes  Treat  Pain Scale Faces  Pain Score 0  Faces Pain Scale 2  Take Vital Signs  Increase Vital Sign Frequency  Yellow: Q 2hr X 2 then Q 4hr X 2, if remains yellow, continue Q 4hrs  Escalate  MEWS: Escalate Yellow: discuss with charge nurse/RN and consider discussing with provider and RRT  Notify: Charge Nurse/RN  Name of Charge Nurse/RN Notified Christena Deem, RN  Date Charge Nurse/RN Notified 06/10/21  Time Charge Nurse/RN Notified 0805  Document  Patient Outcome Other (Comment) (remains on unit)  Progress note created (see row info) Yes  Assess: SIRS CRITERIA  SIRS Temperature  0  SIRS Pulse 1  SIRS Respirations  0  SIRS WBC 0  SIRS Score Sum  1

## 2021-06-10 NOTE — Progress Notes (Signed)
PROGRESS NOTE    Edwin Wright  MHD:[id number] DOB: 12-01-1950 DOA: 06/04/2021 PCP: Almetta Lovely, [facility]   Brief Narrative:  This 70 years old male with PMH significant for hypertension , hard of hearing presented in the ED with complaints of shortness of breath and insomnia for 2 days.  Patient reports having hiccups at home associated with worsening shortness of breath.  CT chest shows right lower, left lower and left upper lobe segmental pulmonary embolism,  no right heart strain or pulmonary infarction noted.  Patient was started on heparin drip and subsequently transitioned to Eliquis.  Patient continued to remain hypoxic requiring supplemental oxygen.  Echocardiogram shows preserved RV systolic function.  Patient is very weak and deconditioned with persistent shortness of breath on exertion.  PT recommended  skilled nursing facility due to deconditioning.  Awaiting nursing home placement.  Assessment & Plan:   Principal Problem:   Bilateral pulmonary embolism (HCC) Active Problems:   Hypertension   ARF (acute renal failure) (HCC)   Iron deficiency anemia   Pulmonary embolism (HCC)  Acute hypoxic respiratory failure sec. to acute bilateral pulmonary embolism: New extensive left lower extremity DVT; Patient presented with tachypnea and hypoxia requiring supplemental oxygen. He is on 2 L/min sats 98%. CTA chest showed multiple pulmonary embolus. Patient was started on IV heparin,  successfully transitioned to Eliquis. Echocardiogram shows preserved LV and RV function.   Lower extremity venous duplex shows extensive DVT in the left femoral vein and left popliteal vein left posterior tibial and left peroneal veins.  Continue adequate pain control with oxycodone. PT recommended skilled nursing facility.  Intractable hiccups: Continue Reglan and Phenergan. Compazine was added.  It Seems improved  COPD: No signs of acute clinical exacerbation. Continue as needed bronchodilator  therapy  Hypertension:  Continue hydralazine for blood pressure control.   AKI on CKD stage IIIa: Renal functions were at baseline.  Continue to monitor renal functions. Continue cautious hydration.  Depression : continue Remeron  Aspiration Pneumonia: Patient has spiked fever 101.6 on 7/22. UA unremarkable, chest x-ray no infiltrate.  WBC up to 18K Patient might have aspirated, continue Unasyn for 3 days. Procalcitonin 1.58, urine culture contaminated, blood cultures no growth. Antibiotics can be changed to Augmentin.   DVT prophylaxis: Eliquis Code Status: Full code. Family Communication: No family at bed side. Disposition Plan:   Status is: Inpatient  Remains inpatient appropriate because:Inpatient level of care appropriate due to severity of illness  Dispo: The patient is from: Home              Anticipated d/c is to: SNF              Patient currently is not medically stable to d/c.   Difficult to place patient No  Consultants:  None  Procedures:  CTA chest. Antimicrobials:   Anti-infectives (From admission, onward)    Start     Dose/Rate Route Frequency Ordered Stop   06/08/21 1000  ampicillin-sulbactam (UNASYN) 1.5 g in sodium chloride 0.9 % 100 mL IVPB        1.5 g 200 mL/hr over 30 Minutes Intravenous Every 6 hours 06/08/21 0824         Subjective: Patient was seen and examined at bedside.  Overnight events noted. Patient is lying comfortably in bed.  He reports hiccups has improved.   He reported neck pain is better but he still has some shortness of breath.  Objective: Vitals:   06/10/21 0755 06/10/21 1010 06/10/21 1206 06/10/21 1356  BP: (!) 125/43 (!) 124/40 (!) 122/50 (!) 98/43  Pulse: (!) 115 (!) 125 97 (!) 104  Resp: 20 (!) 24 16 (!) 24  Temp: 98.9 F (37.2 C) 99.5 F (37.5 C) 99.7 F (37.6 C) 98.2 F (36.8 C)  TempSrc: Oral Oral  Oral  SpO2: 98% 97% 99% 98%  Weight:      Height:        Intake/Output Summary (Last 24 hours) at  06/10/2021 1416 Last data filed at 06/10/2021 0957 Gross per 24 hour  Intake 1044.08 ml  Output 950 ml  Net 94.08 ml    Filed Weights   06/04/21 1221  Weight: 54 kg    Examination:  General exam: Appears deconditioned, thin built, Lying comfortable, hiccups improved. Respiratory system: Clear to auscultation. Respiratory effort normal. Cardiovascular system: S1 & S2 heard, RRR. No JVD, murmurs, rubs, gallops or clicks. No pedal edema. Gastrointestinal system: Abdomen is nondistended, soft and nontender. No organomegaly or masses felt. Normal bowel sounds heard. Central nervous system: Alert and oriented x 2. No focal neurological deficits. Extremities: No edema, no cyanosis, no clubbing. Skin: No rashes, lesions or ulcers Psychiatry: Mood and affect appropriate.    Data Reviewed: I have personally reviewed following labs and imaging studies  CBC: Recent Labs  Lab 06/04/21 1318 06/05/21 0415 06/06/21 0523 06/07/21 0446 06/08/21 0510 06/09/21 0605 06/10/21 0457  WBC 8.7   < > 10.9* 11.7* 18.1* 17.1* 14.0*  NEUTROABS 5.1  --   --   --   --   --   --   HGB 11.7*   < > 11.0* 11.4* 10.8* 10.5* 8.7*  HCT 38.4*   < > 35.9* 38.1* 35.5* 33.9* 27.7*  MCV 98.2   < > 98.1 99.2 97.3 97.1 95.2  PLT 186   < > 205 233 254 317 346   < > = values in this interval not displayed.    Basic Metabolic Panel: Recent Labs  Lab 06/05/21 0415 06/06/21 0523 06/08/21 0510 06/09/21 0605 06/10/21 0457  NA 139 137 132* 136 135  K 4.0 3.9 4.3 4.0 3.8  CL 108 104 99 97* 100  CO2 24 20* 24 25 24   GLUCOSE 91 82 109* 124* 116*  BUN 11 13 18 17 19   CREATININE 1.43* 1.40* 1.59* 1.44* 1.35*  CALCIUM 8.6* 9.1 9.2 9.5 8.7*  MG  --   --  1.9  --   --   PHOS  --   --  2.7  --   --     GFR: Estimated Creatinine Clearance: 38.3 mL/min (A) (by C-G formula based on SCr of 1.35 mg/dL (H)). Liver Function Tests: No results for input(s): AST, ALT, ALKPHOS, BILITOT, PROT, ALBUMIN in the last 168  hours. No results for input(s): LIPASE, AMYLASE in the last 168 hours. No results for input(s): AMMONIA in the last 168 hours. Coagulation Profile: No results for input(s): INR, PROTIME in the last 168 hours. Cardiac Enzymes: No results for input(s): CKTOTAL, CKMB, CKMBINDEX, TROPONINI in the last 168 hours. BNP (last 3 results) No results for input(s): PROBNP in the last 8760 hours. HbA1C: No results for input(s): HGBA1C in the last 72 hours. CBG: No results for input(s): GLUCAP in the last 168 hours. Lipid Profile: No results for input(s): CHOL, HDL, LDLCALC, TRIG, CHOLHDL, LDLDIRECT in the last 72 hours. Thyroid Function Tests: No results for input(s): TSH, T4TOTAL, FREET4, T3FREE, THYROIDAB in the last 72 hours. Anemia Panel: No results for input(s): VITAMINB12, FOLATE,  FERRITIN, TIBC, IRON, RETICCTPCT in the last 72 hours. Sepsis Labs: Recent Labs  Lab 06/08/21 0824  PROCALCITON 1.58     Recent Results (from the past 240 hour(s))  Resp Panel by RT-PCR (Flu A&B, Covid) Nasopharyngeal Swab     Status: None   Collection Time: 06/04/21  1:18 PM   Specimen: Nasopharyngeal Swab; Nasopharyngeal(NP) swabs in vial transport medium  Result Value Ref Range Status   SARS Coronavirus 2 by RT PCR NEGATIVE NEGATIVE Final    Comment: (NOTE) SARS-CoV-2 target nucleic acids are NOT DETECTED.  The SARS-CoV-2 RNA is generally detectable in upper respiratory specimens during the acute phase of infection. The lowest concentration of SARS-CoV-2 viral copies this assay can detect is 138 copies/mL. A negative result does not preclude SARS-Cov-2 infection and should not be used as the sole basis for treatment or other patient management decisions. A negative result may occur with  improper specimen collection/handling, submission of specimen other than nasopharyngeal swab, presence of viral mutation(s) within the areas targeted by this assay, and inadequate number of viral copies(<138  copies/mL). A negative result must be combined with clinical observations, patient history, and epidemiological information. The expected result is Negative.  Fact Sheet for Patients:  BloggerCourse.com  Fact Sheet for Healthcare Providers:  SeriousBroker.it  This test is no t yet approved or cleared by the [place] FDA and  has been authorized for detection and/or diagnosis of SARS-CoV-2 by FDA under an Emergency Use Authorization (EUA). This EUA will remain  in effect (meaning this test can be used) for the duration of the COVID-19 declaration under Section 564(b)(1) of the Act, 21 U.S.C.section 360bbb-3(b)(1), unless the authorization is terminated  or revoked sooner.       Influenza A by PCR NEGATIVE NEGATIVE Final   Influenza B by PCR NEGATIVE NEGATIVE Final    Comment: (NOTE) The Xpert Xpress SARS-CoV-2/FLU/RSV plus assay is intended as an aid in the diagnosis of influenza from Nasopharyngeal swab specimens and should not be used as a sole basis for treatment. Nasal washings and aspirates are unacceptable for Xpert Xpress SARS-CoV-2/FLU/RSV testing.  Fact Sheet for Patients: BloggerCourse.com  Fact Sheet for Healthcare Providers: SeriousBroker.it  This test is not yet approved or cleared by the [place] FDA and has been authorized for detection and/or diagnosis of SARS-CoV-2 by FDA under an Emergency Use Authorization (EUA). This EUA will remain in effect (meaning this test can be used) for the duration of the COVID-19 declaration under Section 564(b)(1) of the Act, 21 U.S.C. section 360bbb-3(b)(1), unless the authorization is terminated or revoked.  Performed at [facility], [address], [place], [place] [address]   Culture, blood (routine x 2)     Status: None (Preliminary result)   Collection Time: 06/07/21  2:38 PM   Specimen:  BLOOD  Result Value Ref Range Status   Specimen Description   Final    BLOOD RIGHT ANTECUBITAL Performed at [facility], [address], [place], [place] [address]    Special Requests   Final    BOTTLES DRAWN AEROBIC AND ANAEROBIC Blood Culture adequate volume Performed at [facility], [address] [place], [place] [address]    Culture   Final    NO GROWTH 3 DAYS Performed at [facility], [address] [place], [place] [address]    Report Status PENDING  Incomplete  Culture, blood (routine x 2)     Status: None (Preliminary result)  Collection Time: 06/07/21  2:48 PM   Specimen: BLOOD  Result Value Ref Range Status   Specimen Description   Final    BLOOD BLOOD RIGHT FOREARM Performed at [facility], [address] [place], [place] [address]    Special Requests   Final    BOTTLES DRAWN AEROBIC AND ANAEROBIC Blood Culture adequate volume Performed at [facility], [address] [place], [place] [address]    Culture   Final    NO GROWTH 3 DAYS Performed at [facility], [address] [place], [place] [address]    Report Status PENDING  Incomplete  Urine Culture     Status: Abnormal   Collection Time: 06/08/21 12:15 AM   Specimen: Urine, Clean Catch  Result Value Ref Range Status   Specimen Description   Final    URINE, CLEAN CATCH Performed at [facility], [address] [place], [place] [address]    Special Requests   Final    NONE Performed at [facility], [address] [place], [place] [address]    Culture MULTIPLE SPECIES PRESENT, SUGGEST RECOLLECTION (A)  Final   Report Status 06/09/2021 FINAL  Final     Radiology Studies: No results found.  Scheduled Meds:  apixaban  10 mg Oral BID   Followed by   Melene Muller[START ON 06/13/2021] apixaban  5 mg Oral BID   brimonidine  1 drop Left Eye TID   cycloSPORINE  1 drop Both Eyes BID    dorzolamide-timolol  1 drop Both Eyes BID   feeding supplement  237 mL Oral BID BM   hydrALAZINE  25 mg Oral Q8H   latanoprost  1 drop Both Eyes QHS   metoCLOPramide (REGLAN) injection  5 mg Intravenous Q6H   metoprolol tartrate  12.5 mg Oral BID   mirtazapine  15 mg Oral QHS   multivitamin with minerals  1 tablet Oral Daily   pantoprazole  40 mg Oral Daily   Continuous Infusions:  ampicillin-sulbactam (UNASYN) IV 1.5 g (06/10/21 1053)   chlorproMAZINE (THORAZINE) IV Stopped (06/10/21 0811)   promethazine (PHENERGAN) injection (IM or IVPB) 6.25 mg (06/09/21 1120)     LOS: 5 days    Time spent: 25 mins    Natilee Gauer, MD [facility]   If 7PM-7AM, please contact night-coverage

## 2021-06-11 DIAGNOSIS — I2699 Other pulmonary embolism without acute cor pulmonale: Secondary | ICD-10-CM | POA: Diagnosis not present

## 2021-06-11 LAB — CBC
HCT: 27.1 % — ABNORMAL LOW (ref 39.0–52.0)
Hemoglobin: 8.5 g/dL — ABNORMAL LOW (ref 13.0–17.0)
MCH: 29.7 pg (ref 26.0–34.0)
MCHC: 31.4 g/dL (ref 30.0–36.0)
MCV: 94.8 fL (ref 80.0–100.0)
Platelets: 362 10*3/uL (ref 150–400)
RBC: 2.86 MIL/uL — ABNORMAL LOW (ref 4.22–5.81)
RDW: 14.8 % (ref 11.5–15.5)
WBC: 12 10*3/uL — ABNORMAL HIGH (ref 4.0–10.5)
nRBC: 0 % (ref 0.0–0.2)

## 2021-06-11 LAB — RESP PANEL BY RT-PCR (FLU A&B, COVID) ARPGX2
Influenza A by PCR: NEGATIVE
Influenza B by PCR: NEGATIVE
SARS Coronavirus 2 by RT PCR: NEGATIVE

## 2021-06-11 MED ORDER — AMOXICILLIN-POT CLAVULANATE 875-125 MG PO TABS
1.0000 | ORAL_TABLET | Freq: Two times a day (BID) | ORAL | Status: AC
  Administered 2021-06-11 – 2021-06-12 (×4): 1 via ORAL
  Filled 2021-06-11 (×4): qty 1

## 2021-06-11 NOTE — Progress Notes (Signed)
Spoke with on-call attending J.Garner Nash NP about administering scheduled metoprolol and hydralazine with patients BP being a little soft. Held scheduled hydralazine for 2200, gave metoprolol and will administer scheduled 0600 hydralazine per NP. Will continue to monitor the patient.

## 2021-06-11 NOTE — Progress Notes (Addendum)
PROGRESS NOTE    Edwin Wright  CWC:[id number] DOB: [birth year] DOA: 06/04/2021 PCP: Almetta Lovely, [facility]   Brief Narrative:  This 70 years old male with PMH significant for hypertension , hard of hearing presented in the ED with complaints of shortness of breath and insomnia for 2 days.  Patient reports having hiccups at home associated with worsening shortness of breath.  CT chest shows right lower, left lower and left upper lobe segmental pulmonary embolism,  no right heart strain or pulmonary infarction noted.  Patient was started on heparin drip and subsequently transitioned to Eliquis.  Patient continued to remain hypoxic requiring supplemental oxygen.  Echocardiogram shows preserved RV systolic function.  Patient is very weak and deconditioned with persistent shortness of breath on exertion.  PT recommended  skilled nursing facility due to deconditioning.  Awaiting nursing home placement.  Assessment & Plan:   Principal Problem:   Bilateral pulmonary embolism (HCC) Active Problems:   Hypertension   ARF (acute renal failure) (HCC)   Iron deficiency anemia   Pulmonary embolism (HCC)  Acute hypoxic respiratory failure sec. to acute bilateral pulmonary embolism: New extensive left lower extremity DVT; Patient presented with tachypnea and hypoxia requiring supplemental oxygen. He is on 2 L/min now sats 98%. CTA chest showed multiple pulmonary embolus. Patient was started on IV heparin,  successfully transitioned to Eliquis. Echocardiogram shows preserved LV and RV function.   Lower extremity venous duplex shows extensive DVT in the left femoral vein and left popliteal vein left posterior tibial and left peroneal veins.  Continue adequate pain control with oxycodone. PT recommended skilled nursing facility.  Intractable hiccups: Continue Reglan and Phenergan. Compazine was added.  It Seems improved  COPD: No signs of acute clinical exacerbation. Continue as needed  bronchodilator therapy  Hypertension:  Continue hydralazine for blood pressure control.   AKI on CKD stage IIIa: Renal functions were at baseline.  Continue to monitor renal functions.   Depression : continue Remeron  Aspiration Pneumonia: Patient has spiked fever 101.6 on 7/22. UA unremarkable, chest x-ray no infiltrate. WBC up to 18K down to 12.0 Patient might have aspirated, started on unasyn  Procalcitonin 1.58, urine culture contaminated, blood cultures no growth. Antibiotics changed to Augmentin( total 5 days)   DVT prophylaxis: Eliquis Code Status: Full code. Family Communication: No family at bed side. Disposition Plan:   Status is: Inpatient  Remains inpatient appropriate because:Inpatient level of care appropriate due to severity of illness  Dispo: The patient is from: Home              Anticipated d/c is to: SNF in 1-2 days              Patient currently is not medically stable to d/c.   Difficult to place patient No  Consultants:  None  Procedures:  CTA chest. Antimicrobials:   Anti-infectives (From admission, onward)    Start     Dose/Rate Route Frequency Ordered Stop   06/11/21 1030  amoxicillin-clavulanate (AUGMENTIN) 875-125 MG per tablet 1 tablet        1 tablet Oral Every 12 hours 06/11/21 1001 06/13/21 0959   06/08/21 1000  ampicillin-sulbactam (UNASYN) 1.5 g in sodium chloride 0.9 % 100 mL IVPB  Status:  Discontinued        1.5 g 200 mL/hr over 30 Minutes Intravenous Every 6 hours 06/08/21 0824 06/11/21 1001       Subjective: Patient was seen and examined at bedside.  Overnight events noted. Patient is lying  comfortably in bed.  He reports hiccups has improved.   He still reports having shortness of breath, requiring oxygen. He reports neck pain is better.  Objective: Vitals:   06/10/21 2206 06/10/21 2356 06/11/21 0508 06/11/21 1023  BP: (!) 139/52 (!) 118/52 (!) 133/57 124/63  Pulse: 91  92 92  Resp:   18   Temp:   (!) 97.5 F (36.4  C)   TempSrc:   Axillary   SpO2:   97%   Weight:      Height:        Intake/Output Summary (Last 24 hours) at 06/11/2021 1156 Last data filed at 06/11/2021 1027 Gross per 24 hour  Intake 1126.28 ml  Output 750 ml  Net 376.28 ml   Filed Weights   06/04/21 1221  Weight: 54 kg    Examination:  General exam: Appears weak,  deconditioned, thin built, lying comfortably. Respiratory system: Clear to auscultation. Respiratory effort normal. Cardiovascular system: S1 & S2 heard, RRR. No JVD, murmurs, rubs, gallops or clicks. No pedal edema. Gastrointestinal system: Abdomen is nondistended, soft and nontender. No organomegaly or masses felt. Normal bowel sounds heard. Central nervous system: Alert and oriented x 2. No focal neurological deficits. Extremities: No edema, no cyanosis, no clubbing. Skin: No rashes, lesions or ulcers Psychiatry: Mood and affect appropriate.    Data Reviewed: I have personally reviewed following labs and imaging studies  CBC: Recent Labs  Lab 06/04/21 1318 06/05/21 0415 06/07/21 0446 06/08/21 0510 06/09/21 0605 06/10/21 0457 06/10/21 1535 06/11/21 0538  WBC 8.7   < > 11.7* 18.1* 17.1* 14.0*  --  12.0*  NEUTROABS 5.1  --   --   --   --   --   --   --   HGB 11.7*   < > 11.4* 10.8* 10.5* 8.7* 7.9* 8.5*  HCT 38.4*   < > 38.1* 35.5* 33.9* 27.7* 25.5* 27.1*  MCV 98.2   < > 99.2 97.3 97.1 95.2  --  94.8  PLT 186   < > 233 254 317 346  --  362   < > = values in this interval not displayed.   Basic Metabolic Panel: Recent Labs  Lab 06/05/21 0415 06/06/21 0523 06/08/21 0510 06/09/21 0605 06/10/21 0457  NA 139 137 132* 136 135  K 4.0 3.9 4.3 4.0 3.8  CL 108 104 99 97* 100  CO2 24 20* 24 25 24   GLUCOSE 91 82 109* 124* 116*  BUN 11 13 18 17 19   CREATININE 1.43* 1.40* 1.59* 1.44* 1.35*  CALCIUM 8.6* 9.1 9.2 9.5 8.7*  MG  --   --  1.9  --   --   PHOS  --   --  2.7  --   --    GFR: Estimated Creatinine Clearance: 38.3 mL/min (A) (by C-G  formula based on SCr of 1.35 mg/dL (H)). Liver Function Tests: No results for input(s): AST, ALT, ALKPHOS, BILITOT, PROT, ALBUMIN in the last 168 hours. No results for input(s): LIPASE, AMYLASE in the last 168 hours. No results for input(s): AMMONIA in the last 168 hours. Coagulation Profile: No results for input(s): INR, PROTIME in the last 168 hours. Cardiac Enzymes: No results for input(s): CKTOTAL, CKMB, CKMBINDEX, TROPONINI in the last 168 hours. BNP (last 3 results) No results for input(s): PROBNP in the last 8760 hours. HbA1C: No results for input(s): HGBA1C in the last 72 hours. CBG: No results for input(s): GLUCAP in the last 168 hours. Lipid Profile: No  results for input(s): CHOL, HDL, LDLCALC, TRIG, CHOLHDL, LDLDIRECT in the last 72 hours. Thyroid Function Tests: No results for input(s): TSH, T4TOTAL, FREET4, T3FREE, THYROIDAB in the last 72 hours. Anemia Panel: No results for input(s): VITAMINB12, FOLATE, FERRITIN, TIBC, IRON, RETICCTPCT in the last 72 hours. Sepsis Labs: Recent Labs  Lab 06/08/21 0824  PROCALCITON 1.58    Recent Results (from the past 240 hour(s))  Resp Panel by RT-PCR (Flu A&B, Covid) Nasopharyngeal Swab     Status: None   Collection Time: 06/04/21  1:18 PM   Specimen: Nasopharyngeal Swab; Nasopharyngeal(NP) swabs in vial transport medium  Result Value Ref Range Status   SARS Coronavirus 2 by RT PCR NEGATIVE NEGATIVE Final    Comment: (NOTE) SARS-CoV-2 target nucleic acids are NOT DETECTED.  The SARS-CoV-2 RNA is generally detectable in upper respiratory specimens during the acute phase of infection. The lowest concentration of SARS-CoV-2 viral copies this assay can detect is 138 copies/mL. A negative result does not preclude SARS-Cov-2 infection and should not be used as the sole basis for treatment or other patient management decisions. A negative result may occur with  improper specimen collection/handling, submission of specimen  other than nasopharyngeal swab, presence of viral mutation(s) within the areas targeted by this assay, and inadequate number of viral copies(<138 copies/mL). A negative result must be combined with clinical observations, patient history, and epidemiological information. The expected result is Negative.  Fact Sheet for Patients:  BloggerCourse.com  Fact Sheet for Healthcare Providers:  SeriousBroker.it  This test is no t yet approved or cleared by the [place] FDA and  has been authorized for detection and/or diagnosis of SARS-CoV-2 by FDA under an Emergency Use Authorization (EUA). This EUA will remain  in effect (meaning this test can be used) for the duration of the COVID-19 declaration under Section 564(b)(1) of the Act, 21 U.S.C.section 360bbb-3(b)(1), unless the authorization is terminated  or revoked sooner.       Influenza A by PCR NEGATIVE NEGATIVE Final   Influenza B by PCR NEGATIVE NEGATIVE Final    Comment: (NOTE) The Xpert Xpress SARS-CoV-2/FLU/RSV plus assay is intended as an aid in the diagnosis of influenza from Nasopharyngeal swab specimens and should not be used as a sole basis for treatment. Nasal washings and aspirates are unacceptable for Xpert Xpress SARS-CoV-2/FLU/RSV testing.  Fact Sheet for Patients: BloggerCourse.com  Fact Sheet for Healthcare Providers: SeriousBroker.it  This test is not yet approved or cleared by the [place] FDA and has been authorized for detection and/or diagnosis of SARS-CoV-2 by FDA under an Emergency Use Authorization (EUA). This EUA will remain in effect (meaning this test can be used) for the duration of the COVID-19 declaration under Section 564(b)(1) of the Act, 21 U.S.C. section 360bbb-3(b)(1), unless the authorization is terminated or revoked.  Performed at [facility], [address] [place], [place] [address]   Culture, blood (routine x 2)     Status: None (Preliminary result)   Collection Time: 06/07/21  2:38 PM   Specimen: BLOOD  Result Value Ref Range Status   Specimen Description   Final    BLOOD RIGHT ANTECUBITAL Performed at [facility], [address], [place], [place] [address]    Special Requests   Final    BOTTLES DRAWN AEROBIC AND ANAEROBIC Blood Culture adequate volume Performed at [facility], [address], [place], [place] [address]    Culture   Final    NO GROWTH 4  DAYS Performed at [facility], [address] [place], [place] [address]    Report Status PENDING  Incomplete  Culture, blood (routine x 2)     Status: None (Preliminary result)   Collection Time: 06/07/21  2:48 PM   Specimen: BLOOD  Result Value Ref Range Status   Specimen Description   Final    BLOOD BLOOD RIGHT FOREARM Performed at [facility], [address] [place], [place] [address]    Special Requests   Final    BOTTLES DRAWN AEROBIC AND ANAEROBIC Blood Culture adequate volume Performed at [facility], [address] [place], [place] [address]    Culture   Final    NO GROWTH 4 DAYS Performed at [facility], [address] [place], [place] [address]    Report Status PENDING  Incomplete  Urine Culture     Status: Abnormal   Collection Time: 06/08/21 12:15 AM   Specimen: Urine, Clean Catch  Result Value Ref Range Status   Specimen Description   Final    URINE, CLEAN CATCH Performed at [facility], [address] [place], [place] [address]    Special Requests   Final    NONE Performed at [facility], [address] [place], [place] [address]    Culture MULTIPLE SPECIES PRESENT, SUGGEST RECOLLECTION (A)  Final   Report Status 06/09/2021 FINAL  Final     Radiology Studies: No results found.  Scheduled Meds:   amoxicillin-clavulanate  1 tablet Oral Q12H   apixaban  10 mg Oral BID   Followed by   Melene Muller[START ON 06/13/2021] apixaban  5 mg Oral BID   brimonidine  1 drop Left Eye TID   cycloSPORINE  1 drop Both Eyes BID   dorzolamide-timolol  1 drop Both Eyes BID   feeding supplement  237 mL Oral BID BM   hydrALAZINE  25 mg Oral Q8H   latanoprost  1 drop Both Eyes QHS   metoCLOPramide (REGLAN) injection  5 mg Intravenous Q6H   metoprolol tartrate  12.5 mg Oral BID   mirtazapine  15 mg Oral QHS   multivitamin with minerals  1 tablet Oral Daily   pantoprazole  40 mg Oral Daily   Continuous Infusions:  sodium chloride     chlorproMAZINE (THORAZINE) IV Stopped (06/11/21 0842)   promethazine (PHENERGAN) injection (IM or IVPB) 6.25 mg (06/09/21 1120)     LOS: 6 days    Time spent: 25 mins    Juaquin Ludington, MD [facility]   If 7PM-7AM, please contact night-coverage

## 2021-06-11 NOTE — Progress Notes (Signed)
Physical Therapy Treatment Patient Details Name: Edwin Wright MRN: [id number] DOB: 1951/06/03 Today's Date: 06/11/2021    History of Present Illness Edwin Wright is a 70 y.o. male came to the ED with complaint of having hiccups which is causing him to feel short of breath and unable to sleep for last 2 days. Chest CT showed a right lower, left lower and left upper lobe segmental pulmonary emboli. Pt started on heparin and admitted. PMH: glaucoma, HTN, HOH.   **06/06/21: pt found to have lower extremity edema with positive deep vein thrombosis involving the left femoral vein, left popliteal vein, left posterior tibial veins and left peroneal veins**    PT Comments    Pt limited by pain, significant pain with rollying from L sidelying to supine, min A to reposition to comfort with LLE and trunk. Pt attempts to pull trunk into upright sitting with use of bedrails, but unable to clear trunk fully into sitting. Pt declines transferring to EOB and declines standing due to pain. Pt requires heavy multimodal cues with mobility and heavy encouragement and education on benefit of mobility. Unable to encourage pt to sit EOB or practice transfers despite education. Pt remains supine at EOS, RN notified of limited treatment due to pain complaints. Will continue to progress acute PT as able.   Follow Up Recommendations  SNF     Equipment Recommendations  Rolling walker with 5" wheels    Recommendations for Other Services       Precautions / Restrictions Precautions Precautions: Fall Precaution Comments: very HOH, low vision (L > R ear) Restrictions Weight Bearing Restrictions: No    Mobility  Bed Mobility Overal bed mobility: Needs Assistance Bed Mobility: Rolling Rolling: Min assist    General bed mobility comments: min A to roll from L sidelying to supine, requiring significantly increased time and heavy multimodal cues, declines to sit EOB due to pain; pt reaches for bedrails to pull trunk  into upright sitting but unable to clear trunk fully into upright sitting due to pain    Transfers  General transfer comment: pt declines  Ambulation/Gait      Stairs             Wheelchair Mobility    Modified Rankin (Stroke Patients Only)       Balance    Cognition Arousal/Alertness: Awake/alert Behavior During Therapy: Flat affect Overall Cognitive Status: Difficult to assess  General Comments: Pt very HOH, requires max multimodal cues with mobility      Exercises      General Comments General comments (skin integrity, edema, etc.): HR 100-110 with bed mobility, SpO2 96% on supplemental O2.      Pertinent Vitals/Pain Pain Assessment: Faces Faces Pain Scale: Hurts whole lot Pain Location: neck, LLE Pain Descriptors / Indicators: Grimacing;Guarding;Sore Pain Intervention(s): Limited activity within patient's tolerance;Monitored during session;Repositioned    Home Living                      Prior Function            PT Goals (current goals can now be found in the care plan section) Acute Rehab PT Goals Patient Stated Goal: sister would like for pt to recover well, return to normal functioning PT Goal Formulation: With patient/family Time For Goal Achievement: 06/20/21 Potential to Achieve Goals: Good Progress towards PT goals: Not progressing toward goals - comment (very limited due to pain)    Frequency    Min 2X/week  PT Plan Current plan remains appropriate    Co-evaluation              AM-PAC PT "6 Clicks" Mobility   Outcome Measure  Help needed turning from your back to your side while in a flat bed without using bedrails?: A Lot Help needed moving from lying on your back to sitting on the side of a flat bed without using bedrails?: A Lot Help needed moving to and from a bed to a chair (including a wheelchair)?: Total Help needed standing up from a chair using your arms (e.g., wheelchair or bedside chair)?:  Total Help needed to walk in hospital room?: Total Help needed climbing 3-5 steps with a railing? : Total 6 Click Score: 8    End of Session   Activity Tolerance: Patient limited by pain Patient left: in bed;with call bell/phone within reach;with bed alarm set Nurse Communication: Mobility status;Other (comment) (pain) PT Visit Diagnosis: Unsteadiness on feet (R26.81);Muscle weakness (generalized) (M62.81);Difficulty in walking, not elsewhere classified (R26.2);Pain Pain - Right/Left: Left Pain - part of body: Leg     Time: 1100-1117 PT Time Calculation (min) (ACUTE ONLY): 17 min  Charges:  $Therapeutic Activity: 8-22 mins                      Tori Herberth Deharo PT, DPT 06/11/21, 11:45 AM

## 2021-06-11 NOTE — Progress Notes (Signed)
   06/10/21 1949  Assess: MEWS Score  Temp 98.3 F (36.8 C)  BP (!) 123/51  Pulse Rate 90  Resp 20  Level of Consciousness Alert  SpO2 96 %  O2 Device Nasal Cannula  O2 Flow Rate (L/min) 2 L/min  Assess: MEWS Score  MEWS Temp 0  MEWS Systolic 0  MEWS Pulse 0  MEWS RR 0  MEWS LOC 0  MEWS Score 0  MEWS Score Color Green  Assess: if the MEWS score is Yellow or Red  Were vital signs taken at a resting state? Yes  Focused Assessment No change from prior assessment  Does the patient meet 2 or more of the SIRS criteria? No  Does the patient have a confirmed or suspected source of infection? No  MEWS guidelines implemented *See Row Information* No, previously yellow, continue vital signs every 4 hours  Treat  MEWS Interventions Other (Comment) (pt resting)  Document  Patient Outcome Other (Comment) (remain on unit)  Progress note created (see row info) Yes  Assess: SIRS CRITERIA  SIRS Temperature  0  SIRS Pulse 0  SIRS Respirations  0  SIRS WBC 0  SIRS Score Sum  0  Patient remain green MEWS and is on the unit resting. Will continue to monitor the patient.

## 2021-06-11 NOTE — Progress Notes (Signed)
OT Cancellation Note  Patient Details Name: Edwin Wright MRN: [id number] DOB: 01-20-1951   Cancelled Treatment:    Reason Eval/Treat Not Completed: Patient declined, no reason specified patient reported having pain when moving and that he did not want to be in pain.patient was unable to specify where pain was located. Nurse made aware. Will continue to follow.  Sharyn Blitz OTR/L, MS Acute Rehabilitation Department Office# [phone] Pager# [phone]   Chalmers Guest Violetta Lavalle 06/11/2021, 2:31 PM

## 2021-06-12 DIAGNOSIS — I2699 Other pulmonary embolism without acute cor pulmonale: Secondary | ICD-10-CM | POA: Diagnosis not present

## 2021-06-12 LAB — CULTURE, BLOOD (ROUTINE X 2)
Culture: NO GROWTH
Culture: NO GROWTH
Special Requests: ADEQUATE
Special Requests: ADEQUATE

## 2021-06-12 NOTE — Progress Notes (Signed)
PROGRESS NOTE    Edwin Wright  NUU:[id number] DOB: 03/01/1951 DOA: 06/04/2021 PCP: Almetta LovelyHousecalls, [facility]     Brief Narrative:  Edwin AmorDonald Prouse is a 70 year old male with PMH significant for hypertension, hard of hearing presented in the ED with complaints of shortness of breath and insomnia for 2 days.  Patient reports having hiccups at home associated with worsening shortness of breath.  CT chest shows right lower, left lower and left upper lobe segmental pulmonary embolism, no right heart strain or pulmonary infarction noted.  Patient was started on heparin drip and subsequently transitioned to Eliquis.  Patient continued to remain hypoxic requiring supplemental oxygen.  Echocardiogram shows preserved RV systolic function.  Patient is very weak and deconditioned with persistent shortness of breath on exertion.  PT recommended  skilled nursing facility due to deconditioning.  Awaiting nursing home placement and insurance authorization.  New events last 24 hours / Subjective: Patient extremely hard of hearing, states that his hearing aids are out of battery and waiting for his sister to bring in.  No acute events reported overnight  Assessment & Plan:   Principal Problem:   Bilateral pulmonary embolism (HCC) Active Problems:   Hypertension   ARF (acute renal failure) (HCC)   Iron deficiency anemia   Pulmonary embolism (HCC)    Acute hypoxic respiratory failure secondary to acute bilateral pulmonary embolism, also with left lower extremity DVT Patient presented with tachypnea and hypoxia requiring supplemental oxygen. He is on 2 L/min now  CTA chest showed multiple pulmonary embolus. Patient was started on IV heparin,  successfully transitioned to Eliquis. Echocardiogram shows preserved LV and RV function.   Lower extremity venous duplex shows extensive DVT in the left femoral vein and left popliteal vein left posterior tibial and left peroneal veins.  Continue adequate pain control  with oxycodone. PT recommended skilled nursing facility.   Intractable hiccups Resolved.  Stop scheduled Reglan   COPD No signs of acute clinical exacerbation. Continue as needed bronchodilator therapy   Hypertension Continue hydralazine, metoprolol   AKI on CKD stage IIIa Resolved, stable   Depression  Continue Remeron   Aspiration Pneumonia Patient has spiked fever 101.6 on 7/22. UA unremarkable, chest x-ray no infiltrate. WBC up to 18 down to 12 Patient might have aspirated, started on unasyn  Procalcitonin 1.58, urine culture contaminated, blood cultures no growth. Antibiotics changed to Augmentin (total 5 days)     DVT prophylaxis:   apixaban (ELIQUIS) tablet 10 mg  apixaban (ELIQUIS) tablet 5 mg  Code Status:     Code Status Orders  (From admission, onward)           Start     Ordered   06/09/21 1449  Do not attempt resuscitation (DNR)  Continuous       Question Answer Comment  In the event of cardiac or respiratory ARREST Do not call a "code blue"   In the event of cardiac or respiratory ARREST Do not perform Intubation, CPR, defibrillation or ACLS   In the event of cardiac or respiratory ARREST Use medication by any route, position, wound care, and other measures to relive pain and suffering. May use oxygen, suction and manual treatment of airway obstruction as needed for comfort.      06/09/21 1448           Code Status History     Date Active Date Inactive Code Status Order ID Comments User Context   06/04/2021 1831 06/09/2021 1448 Full Code [id number]  Lanae Boast, MD ED   11/11/2016 1430 11/11/2016 2016 DNR [id number]  Rosalin Hawking, MD Inpatient   11/02/2016 2201 11/11/2016 1430 Full Code [id number]  Therisa Doyne, MD Inpatient   07/21/2014 0217 07/23/2014 1711 Full Code [id number]  Eldred Manges, MD Inpatient      Advance Directive Documentation    Flowsheet Row Most Recent Value  Type of Advance Directive Healthcare Power of Attorney   [sister]  Pre-existing out of facility DNR order (yellow form or pink MOST form) --  "MOST" Form in Place? --      Family Communication: None at bedside Disposition Plan:  Status is: Inpatient  Remains inpatient appropriate because:Unsafe d/c plan  Dispo: The patient is from: Home              Anticipated d/c is to: SNF              Patient currently is medically stable to d/c.   Difficult to place patient No     Antimicrobials:  Anti-infectives (From admission, onward)    Start     Dose/Rate Route Frequency Ordered Stop   06/11/21 1030  amoxicillin-clavulanate (AUGMENTIN) 875-125 MG per tablet 1 tablet        1 tablet Oral Every 12 hours 06/11/21 1001 06/13/21 0959   06/08/21 1000  ampicillin-sulbactam (UNASYN) 1.5 g in sodium chloride 0.9 % 100 mL IVPB  Status:  Discontinued        1.5 g 200 mL/hr over 30 Minutes Intravenous Every 6 hours 06/08/21 0824 06/11/21 1001        Objective: Vitals:   06/11/21 1322 06/11/21 2030 06/12/21 0514 06/12/21 1301  BP: 128/62 134/65 (!) 154/63 (!) 147/80  Pulse: 85 100 91 91  Resp: 17 18 18 17   Temp: 97.8 F (36.6 C) 98.8 F (37.1 C) 97.7 F (36.5 C) 97.7 F (36.5 C)  TempSrc: Oral Axillary Axillary Oral  SpO2: 99% 99% 98% 98%  Weight:      Height:        Intake/Output Summary (Last 24 hours) at 06/12/2021 1457 Last data filed at 06/12/2021 0855 Gross per 24 hour  Intake 118 ml  Output --  Net 118 ml   Filed Weights   06/04/21 1221  Weight: 54 kg    Examination:  General exam: Appears calm and comfortable, extremely hard of hearing Respiratory system: Respiratory effort normal. No respiratory distress. Cardiovascular system: S1 & S2 heard, RRR. No murmurs. No pedal edema. Gastrointestinal system: Abdomen is nondistended, soft and nontender. Normal bowel sounds heard. Central nervous system: Alert  Extremities: Symmetric in appearance  Skin: No rashes, lesions or ulcers on exposed skin  Psychiatry: Stable  Data  Reviewed: I have personally reviewed following labs and imaging studies  CBC: Recent Labs  Lab 06/07/21 0446 06/08/21 0510 06/09/21 0605 06/10/21 0457 06/10/21 1535 06/11/21 0538  WBC 11.7* 18.1* 17.1* 14.0*  --  12.0*  HGB 11.4* 10.8* 10.5* 8.7* 7.9* 8.5*  HCT 38.1* 35.5* 33.9* 27.7* 25.5* 27.1*  MCV 99.2 97.3 97.1 95.2  --  94.8  PLT 233 254 317 346  --  362   Basic Metabolic Panel: Recent Labs  Lab 06/06/21 0523 06/08/21 0510 06/09/21 0605 06/10/21 0457  NA 137 132* 136 135  K 3.9 4.3 4.0 3.8  CL 104 99 97* 100  CO2 20* 24 25 24   GLUCOSE 82 109* 124* 116*  BUN 13 18 17 19   CREATININE 1.40* 1.59* 1.44* 1.35*  CALCIUM 9.1 9.2 9.5  8.7*  MG  --  1.9  --   --   PHOS  --  2.7  --   --    GFR: Estimated Creatinine Clearance: 38.3 mL/min (A) (by C-G formula based on SCr of 1.35 mg/dL (H)). Liver Function Tests: No results for input(s): AST, ALT, ALKPHOS, BILITOT, PROT, ALBUMIN in the last 168 hours. No results for input(s): LIPASE, AMYLASE in the last 168 hours. No results for input(s): AMMONIA in the last 168 hours. Coagulation Profile: No results for input(s): INR, PROTIME in the last 168 hours. Cardiac Enzymes: No results for input(s): CKTOTAL, CKMB, CKMBINDEX, TROPONINI in the last 168 hours. BNP (last 3 results) No results for input(s): PROBNP in the last 8760 hours. HbA1C: No results for input(s): HGBA1C in the last 72 hours. CBG: No results for input(s): GLUCAP in the last 168 hours. Lipid Profile: No results for input(s): CHOL, HDL, LDLCALC, TRIG, CHOLHDL, LDLDIRECT in the last 72 hours. Thyroid Function Tests: No results for input(s): TSH, T4TOTAL, FREET4, T3FREE, THYROIDAB in the last 72 hours. Anemia Panel: No results for input(s): VITAMINB12, FOLATE, FERRITIN, TIBC, IRON, RETICCTPCT in the last 72 hours. Sepsis Labs: Recent Labs  Lab 06/08/21 0824  PROCALCITON 1.58    Recent Results (from the past 240 hour(s))  Resp Panel by RT-PCR (Flu A&B,  Covid) Nasopharyngeal Swab     Status: None   Collection Time: 06/04/21  1:18 PM   Specimen: Nasopharyngeal Swab; Nasopharyngeal(NP) swabs in vial transport medium  Result Value Ref Range Status   SARS Coronavirus 2 by RT PCR NEGATIVE NEGATIVE Final    Comment: (NOTE) SARS-CoV-2 target nucleic acids are NOT DETECTED.  The SARS-CoV-2 RNA is generally detectable in upper respiratory specimens during the acute phase of infection. The lowest concentration of SARS-CoV-2 viral copies this assay can detect is 138 copies/mL. A negative result does not preclude SARS-Cov-2 infection and should not be used as the sole basis for treatment or other patient management decisions. A negative result may occur with  improper specimen collection/handling, submission of specimen other than nasopharyngeal swab, presence of viral mutation(s) within the areas targeted by this assay, and inadequate number of viral copies(<138 copies/mL). A negative result must be combined with clinical observations, patient history, and epidemiological information. The expected result is Negative.  Fact Sheet for Patients:  BloggerCourse.com  Fact Sheet for Healthcare Providers:  SeriousBroker.it  This test is no t yet approved or cleared by the [place] FDA and  has been authorized for detection and/or diagnosis of SARS-CoV-2 by FDA under an Emergency Use Authorization (EUA). This EUA will remain  in effect (meaning this test can be used) for the duration of the COVID-19 declaration under Section 564(b)(1) of the Act, 21 U.S.C.section 360bbb-3(b)(1), unless the authorization is terminated  or revoked sooner.       Influenza A by PCR NEGATIVE NEGATIVE Final   Influenza B by PCR NEGATIVE NEGATIVE Final    Comment: (NOTE) The Xpert Xpress SARS-CoV-2/FLU/RSV plus assay is intended as an aid in the diagnosis of influenza from Nasopharyngeal swab specimens and should  not be used as a sole basis for treatment. Nasal washings and aspirates are unacceptable for Xpert Xpress SARS-CoV-2/FLU/RSV testing.  Fact Sheet for Patients: BloggerCourse.com  Fact Sheet for Healthcare Providers: SeriousBroker.it  This test is not yet approved or cleared by the [place] FDA and has been authorized for detection and/or diagnosis of SARS-CoV-2 by FDA under an Emergency Use Authorization (EUA). This EUA will remain in  effect (meaning this test can be used) for the duration of the COVID-19 declaration under Section 564(b)(1) of the Act, 21 U.S.C. section 360bbb-3(b)(1), unless the authorization is terminated or revoked.  Performed at [facility], [address], [place], [place] [address]   Culture, blood (routine x 2)     Status: None   Collection Time: 06/07/21  2:38 PM   Specimen: BLOOD  Result Value Ref Range Status   Specimen Description   Final    BLOOD RIGHT ANTECUBITAL Performed at [facility], [address], [place], [place] [address]    Special Requests   Final    BOTTLES DRAWN AEROBIC AND ANAEROBIC Blood Culture adequate volume Performed at [facility], [address] [place], [place] [address]    Culture   Final    NO GROWTH 5 DAYS Performed at [facility], [address] [place], [place] [address]    Report Status 06/12/2021 FINAL  Final  Culture, blood (routine x 2)     Status: None   Collection Time: 06/07/21  2:48 PM   Specimen: BLOOD  Result Value Ref Range Status   Specimen Description   Final    BLOOD BLOOD RIGHT FOREARM Performed at [facility], [address], [place], [place] [address]    Special Requests   Final    BOTTLES DRAWN AEROBIC AND ANAEROBIC Blood Culture adequate volume Performed at [facility], [address] [place], [place] [address]    Culture   Final     NO GROWTH 5 DAYS Performed at [facility], [address], [place], [place] [address]    Report Status 06/12/2021 FINAL  Final  Urine Culture     Status: Abnormal   Collection Time: 06/08/21 12:15 AM   Specimen: Urine, Clean Catch  Result Value Ref Range Status   Specimen Description   Final    URINE, CLEAN CATCH Performed at [facility], [address] [place], [place] [address]    Special Requests   Final    NONE Performed at [facility], [address], [place], [place] [address]    Culture MULTIPLE SPECIES PRESENT, SUGGEST RECOLLECTION (A)  Final   Report Status 06/09/2021 FINAL  Final  Resp Panel by RT-PCR (Flu A&B, Covid) Nasopharyngeal Swab     Status: None   Collection Time: 06/11/21  3:52 PM   Specimen: Nasopharyngeal Swab; Nasopharyngeal(NP) swabs in vial transport medium  Result Value Ref Range Status   SARS Coronavirus 2 by RT PCR NEGATIVE NEGATIVE Final    Comment: (NOTE) SARS-CoV-2 target nucleic acids are NOT DETECTED.  The SARS-CoV-2 RNA is generally detectable in upper respiratory specimens during the acute phase of infection. The lowest concentration of SARS-CoV-2 viral copies this assay can detect is 138 copies/mL. A negative result does not preclude SARS-Cov-2 infection and should not be used as the sole basis for treatment or other patient management decisions. A negative result may occur with  improper specimen collection/handling, submission of specimen other than nasopharyngeal swab, presence of viral mutation(s) within the areas targeted by this assay, and inadequate number of viral copies(<138 copies/mL). A negative result must be combined with clinical observations, patient history, and epidemiological information. The expected result is Negative.  Fact Sheet for Patients:  BloggerCourse.com  Fact Sheet for Healthcare Providers:   SeriousBroker.it  This test is no t yet approved or cleared by the [place]  FDA and  has been authorized for detection and/or diagnosis of SARS-CoV-2 by FDA under an Emergency Use Authorization (EUA). This EUA will remain  in effect (meaning this test can be used) for the duration of the COVID-19 declaration under Section 564(b)(1) of the Act, 21 U.S.C.section 360bbb-3(b)(1), unless the authorization is terminated  or revoked sooner.       Influenza A by PCR NEGATIVE NEGATIVE Final   Influenza B by PCR NEGATIVE NEGATIVE Final    Comment: (NOTE) The Xpert Xpress SARS-CoV-2/FLU/RSV plus assay is intended as an aid in the diagnosis of influenza from Nasopharyngeal swab specimens and should not be used as a sole basis for treatment. Nasal washings and aspirates are unacceptable for Xpert Xpress SARS-CoV-2/FLU/RSV testing.  Fact Sheet for Patients: BloggerCourse.com  Fact Sheet for Healthcare Providers: SeriousBroker.it  This test is not yet approved or cleared by the [place] FDA and has been authorized for detection and/or diagnosis of SARS-CoV-2 by FDA under an Emergency Use Authorization (EUA). This EUA will remain in effect (meaning this test can be used) for the duration of the COVID-19 declaration under Section 564(b)(1) of the Act, 21 U.S.C. section 360bbb-3(b)(1), unless the authorization is terminated or revoked.  Performed at [facility], [address] [place], [place] [address]       Radiology Studies: No results found.    Scheduled Meds:  amoxicillin-clavulanate  1 tablet Oral Q12H   apixaban  10 mg Oral BID   Followed by   Melene Muller ON 06/13/2021] apixaban  5 mg Oral BID   brimonidine  1 drop Left Eye TID   cycloSPORINE  1 drop Both Eyes BID   dorzolamide-timolol  1 drop Both Eyes BID   feeding supplement  237 mL Oral BID BM   hydrALAZINE  25 mg Oral  Q8H   latanoprost  1 drop Both Eyes QHS   metoprolol tartrate  12.5 mg Oral BID   mirtazapine  15 mg Oral QHS   multivitamin with minerals  1 tablet Oral Daily   pantoprazole  40 mg Oral Daily   Continuous Infusions:  sodium chloride     promethazine (PHENERGAN) injection (IM or IVPB) 6.25 mg (06/09/21 1120)     LOS: 7 days      Time spent: 20 minutes   Noralee Stain, DO [facility] 06/12/2021, 2:57 PM   Available via Epic secure chat 7am-7pm After these hours, please refer to coverage provider listed on [email or web address]

## 2021-06-12 NOTE — Progress Notes (Signed)
Nutrition Follow-up  DOCUMENTATION CODES:   Underweight  INTERVENTION:   -Ensure Enlive po BID, each supplement provides 350 kcal and 20 grams of protein   -Multivitamin with minerals daily  NUTRITION DIAGNOSIS:   Increased nutrient needs related to acute illness as evidenced by estimated needs.  Ongoing.  GOAL:   Patient will meet greater than or equal to 90% of their needs  Progressing.  MONITOR:   PO intake, Supplement acceptance, Labs, Weight trends, I & O's  REASON FOR ASSESSMENT:   Consult Assessment of nutrition requirement/status  ASSESSMENT:   70 yo male with the past medical history of hypertension, who presented with dyspnea and insomnia for about 2 days. Admitted to the hospital with the working diagnosis of bilateral pulmonary embolism.  Patient is currently consuming 15-50% of meals.  Pt had been accepting Ensures every day until today he has been refusing.  Admission weight: 119 lbs. No other weights for admission.  Medications: Remeron  Labs reviewed.  Diet Order:   Diet Order             Diet 2 gram sodium Room service appropriate? Yes; Fluid consistency: Thin  Diet effective now                   EDUCATION NEEDS:   No education needs have been identified at this time  Skin:  Skin Assessment: Reviewed RN Assessment  Last BM:  PTA  Height:   Ht Readings from Last 1 Encounters:  06/04/21 5\' 10"  (1.778 m)    Weight:   Wt Readings from Last 1 Encounters:  06/04/21 54 kg    BMI:  Body mass index is 17.08 kg/m.  Estimated Nutritional Needs:   Kcal:  1700-1900  Protein:  85-95g  Fluid:  1.7L/day  06/06/21, MS, RD, LDN Inpatient Clinical Dietitian Contact information available via Amion

## 2021-06-12 NOTE — TOC Progression Note (Signed)
Transition of Care [facility]) - Progression Note    Patient Details  Name: Edwin Wright MRN: [id number] Date of Birth: Nov 15, 1951  Transition [facility] Care [facility]) CM/SW Contact  Ida Rogue, [place] Phone Number: 06/12/2021, 4:00 PM  Clinical Narrative:   Fransico Him authorization [id number] for 3 days, 7/27-29. Reference # [id number]. Sonny Dandy will take patient tomorrow.    Expected Discharge Plan: Skilled Nursing Facility Barriers to Discharge: SNF Pending bed offer  Expected Discharge Plan and Services Expected Discharge Plan: Skilled Nursing Facility   Discharge Planning Services: CM Consult Post Acute Care Choice: Skilled Nursing Facility Living arrangements for the past 2 months: Single Family Home                                       Social Determinants of Health (SDOH) Interventions    Readmission Risk Interventions No flowsheet data found.

## 2021-06-13 ENCOUNTER — Encounter: Payer: Self-pay | Admitting: Adult Health

## 2021-06-13 ENCOUNTER — Non-Acute Institutional Stay (SKILLED_NURSING_FACILITY): Payer: Self-pay | Admitting: Adult Health

## 2021-06-13 DIAGNOSIS — I2699 Other pulmonary embolism without acute cor pulmonale: Secondary | ICD-10-CM | POA: Diagnosis not present

## 2021-06-13 DIAGNOSIS — I82402 Acute embolism and thrombosis of unspecified deep veins of left lower extremity: Secondary | ICD-10-CM

## 2021-06-13 DIAGNOSIS — J449 Chronic obstructive pulmonary disease, unspecified: Secondary | ICD-10-CM

## 2021-06-13 DIAGNOSIS — I10 Essential (primary) hypertension: Secondary | ICD-10-CM

## 2021-06-13 DIAGNOSIS — E43 Unspecified severe protein-calorie malnutrition: Secondary | ICD-10-CM

## 2021-06-13 LAB — BASIC METABOLIC PANEL
Anion gap: 9 (ref 5–15)
BUN: 22 mg/dL (ref 8–23)
CO2: 22 mmol/L (ref 22–32)
Calcium: 9.1 mg/dL (ref 8.9–10.3)
Chloride: 106 mmol/L (ref 98–111)
Creatinine, Ser: 1.08 mg/dL (ref 0.61–1.24)
GFR, Estimated: >60 mL/min (ref >60)
Glucose, Bld: 105 mg/dL — ABNORMAL HIGH (ref 70–99)
Potassium: 3.8 mmol/L (ref 3.5–5.1)
Sodium: 137 mmol/L (ref 135–145)

## 2021-06-13 LAB — CBC
HCT: 29.7 % — ABNORMAL LOW (ref 39.0–52.0)
Hemoglobin: 9.1 g/dL — ABNORMAL LOW (ref 13.0–17.0)
MCH: 29.3 pg (ref 26.0–34.0)
MCHC: 30.6 g/dL (ref 30.0–36.0)
MCV: 95.5 fL (ref 80.0–100.0)
Platelets: 586 10*3/uL — ABNORMAL HIGH (ref 150–400)
RBC: 3.11 MIL/uL — ABNORMAL LOW (ref 4.22–5.81)
RDW: 15.6 % — ABNORMAL HIGH (ref 11.5–15.5)
WBC: 12.3 10*3/uL — ABNORMAL HIGH (ref 4.0–10.5)
nRBC: 0 % (ref 0.0–0.2)

## 2021-06-13 MED ORDER — APIXABAN 5 MG PO TABS: 5.0000 mg | ORAL_TABLET | Freq: Two times a day (BID) | ORAL | 0 refills | Status: DC

## 2021-06-13 MED ORDER — HYDRALAZINE HCL 25 MG PO TABS: 25.0000 mg | ORAL_TABLET | Freq: Three times a day (TID) | ORAL | 0 refills | Status: DC

## 2021-06-13 MED ORDER — METOPROLOL TARTRATE 25 MG PO TABS: 12.5000 mg | ORAL_TABLET | Freq: Two times a day (BID) | ORAL | 0 refills | Status: DC

## 2021-06-13 MED ORDER — PANTOPRAZOLE SODIUM 40 MG PO TBEC: 40.0000 mg | DELAYED_RELEASE_TABLET | Freq: Every day | ORAL | 0 refills | Status: DC

## 2021-06-13 NOTE — Progress Notes (Signed)
Civil engineer, contracting [facility])  Hospital Liaison RN note         Notified by [facility] manager of patient/family request for [facility] Palliative services at [facility] after discharge.              [facility] Palliative team will follow up with patient after discharge.         Please call with any hospice or palliative related questions.         Thank you for the opportunity to participate in this patient's care.     Gillian Scarce, BSN, RN [facility] Hospital Liaison (listed on Advance under Hospice[facility])    [phone] (24h on call)

## 2021-06-13 NOTE — TOC Transition Note (Signed)
Transition of Care [facility]) - CM/SW Discharge Note   Patient Details  Name: Edwin Wright MRN: [id number] Date of Birth: 1951-01-18  Transition of Care [facility]) CM/SW Contact:  Ida Rogue, LCSW Phone Number: 06/13/2021, 12:09 PM   Clinical Narrative:   Patient who is stable for d/c today will transfer to [facility].  Family alerted.  Referral made to [facility] for outpatient palliative services. PTAR arranged. Nursing, please call [phone] for report. TOC sign off.    Final next level of care: Skilled Nursing Facility Barriers to Discharge: Barriers Resolved   Patient Goals and CMS Choice     Choice offered to / list presented to : Patient, Sibling  Discharge Placement                       Discharge Plan and Services   Discharge Planning Services: CM Consult Post Acute Care Choice: Skilled Nursing Facility                               Social Determinants of Health (SDOH) Interventions     Readmission Risk Interventions No flowsheet data found.

## 2021-06-13 NOTE — Progress Notes (Signed)
Attempted to call the number to give report and the nurse never picked up the phone. The front desk transferred me twice and said I will have to call back. Will try again later .

## 2021-06-13 NOTE — Care Management Important Message (Signed)
Important Message  Patient Details IM Letter given to the Patient. Name: Edwin Wright MRN: [id number] Date of Birth: 11-02-1951   Medicare Important Message Given:  Yes     Caren Macadam 06/13/2021, 10:31 AM

## 2021-06-13 NOTE — Progress Notes (Signed)
Location:  [facility] Room Number: 116-A Place of Service:  SNF (31) Provider:  Kenard Gower, DNP, FNP-BC  Patient Care Team: [facility] as PCP - General (Geriatric Medicine)  Extended Emergency Contact Information Primary Emergency Contact: Copeland,Dorothy  [place] Home Phone: [phone] Relation: Sister  Code Status: DNR   Goals of care: Advanced Directive information Advanced Directives 06/13/2021  Does Patient Have a Medical Advance Directive? Yes  Type of Advance Directive Out of facility DNR (pink MOST or yellow form)  Does patient want to make changes to medical advance directive? No - Patient declined  Copy of Healthcare Power of Attorney in Chart? -  Would patient like information on creating a medical advance directive? -     Chief Complaint  Patient presents with   Hospitalization Follow-up    HPI:  Pt is a 70 y.o. male who was admitted to [facility] today, 06/13/21 post hospital admission 06/04/2021 to 06/13/2021.  He has a PMH of hypertension and hard of hearing.  He presented to the ED with complaints of shortness of breath and insomnia for 2 days.  He reported having hiccups at home associated with worsening shortness of breath.  CT chest showed right lower, left lower and left upper lobe segmental pulmonary embolism, no right heart strain or pulmonary infarction noted. He was, also, noted for LLE DVT. He was started on heparin drip and subsequently transitioned to Eliquis.  He continued to remain hypoxic requiring supplemental oxygen.  Echocardiogram showed preserved RV systolic function.  He was discharged to [facility] due to deconditioning.  He was seen in his room today. He denies having any concerns. Noted to be hard of hearing and does not verbalize much.    Past Medical History:  Diagnosis Date   Glaucoma    HOH (hard of hearing)    Hypertension    Past  Surgical History:  Procedure Laterality Date   ARTHROTOMY Left 07/21/2014   Procedure: ARTHROTOMY; IRRIGATION AND DRAINAGE OF LEFT WRIST INFECTION;  Surgeon: Eldred Manges, MD;  Location: [facility] OR;  Service: Orthopedics;  Laterality: Left;    Allergies  Allergen Reactions   Lactose Intolerance (Gi)     UNK reaction    Outpatient Encounter Medications as of 06/13/2021  Medication Sig   apixaban (ELIQUIS) 5 MG TABS tablet Take 1 tablet (5 mg total) by mouth 2 (two) times daily.   bisacodyl (DULCOLAX) 10 MG suppository 10 mg. If not relieved by MOM, give 10 mg Bisacodyl suppositiory rectally X 1 dose in 24 hours as needed (Do not use constipation standing orders for residents with renal failure/CFR less than 30. Contact MD for orders)   brimonidine (ALPHAGAN) 0.2 % ophthalmic solution Place 1 drop into the left eye 3 (three) times daily.   dorzolamide-timolol (COSOPT) 22.3-6.8 MG/ML ophthalmic solution Place 1 drop into both eyes 2 (two) times daily.   feeding supplement, ENSURE ENLIVE, (ENSURE ENLIVE) LIQD Take 237 mLs by mouth 3 (three) times daily between meals.   hydrALAZINE (APRESOLINE) 25 MG tablet Take 1 tablet (25 mg total) by mouth 3 (three) times daily.   latanoprost (XALATAN) 0.005 % ophthalmic solution Place 1 drop into both eyes at bedtime.   magnesium hydroxide (MILK OF MAGNESIA) 400 MG/5ML suspension If no BM in 3 days, give 30 cc Milk of Magnesium p.o. x 1 dose in 24 hours as needed (Do not use standing constipation orders for residents with renal failure CFR less  than 30. Contact MD for orders)   metoprolol tartrate (LOPRESSOR) 25 MG tablet Take 0.5 tablets (12.5 mg total) by mouth 2 (two) times daily.   mirtazapine (REMERON) 15 MG tablet Take 15 mg by mouth at bedtime.   NON FORMULARY Low Sodium, Heart Healthy Thin liquids   pantoprazole (PROTONIX) 40 MG tablet Take 1 tablet (40 mg total) by mouth daily.   RESTASIS 0.05 % ophthalmic emulsion Place 1 drop into both eyes 2 (two) times  daily.   Sodium Phosphates (RA SALINE ENEMA RE) If not relieved by Biscodyl suppository, give disposable Saline Enema rectally X 1 dose/24 hrs as needed (Do not use constipation standing orders for residents with renal failure/CFR less than 30. Contact MD for orders)   [DISCONTINUED] 0.9 %  sodium chloride infusion    [DISCONTINUED] acetaminophen (TYLENOL) suppository 650 mg    [DISCONTINUED] acetaminophen (TYLENOL) tablet 650 mg    [DISCONTINUED] albuterol (PROVENTIL) (2.5 MG/3ML) 0.083% nebulizer solution 2.5 mg    [DISCONTINUED] apixaban (ELIQUIS) tablet 5 mg    [DISCONTINUED] brimonidine (ALPHAGAN) 0.2 % ophthalmic solution 1 drop    [DISCONTINUED] cyclobenzaprine (FLEXERIL) tablet 5 mg    [DISCONTINUED] cycloSPORINE (RESTASIS) 0.05 % ophthalmic emulsion 1 drop    [DISCONTINUED] dorzolamide-timolol (COSOPT) 22.3-6.8 MG/ML ophthalmic solution 1 drop    [DISCONTINUED] feeding supplement (ENSURE ENLIVE / ENSURE PLUS) liquid 237 mL    [DISCONTINUED] hydrALAZINE (APRESOLINE) tablet 25 mg    [DISCONTINUED] latanoprost (XALATAN) 0.005 % ophthalmic solution 1 drop    [DISCONTINUED] metoprolol tartrate (LOPRESSOR) tablet 12.5 mg    [DISCONTINUED] mirtazapine (REMERON) tablet 15 mg    [DISCONTINUED] multivitamin with minerals tablet 1 tablet    [DISCONTINUED] pantoprazole (PROTONIX) EC tablet 40 mg    [DISCONTINUED] promethazine (PHENERGAN) 6.25 mg in sodium chloride 0.9 % 50 mL IVPB    [DISCONTINUED] senna-docusate (Senokot-S) tablet 1 tablet    No facility-administered encounter medications on file as of 06/13/2021.    Review of Systems  GENERAL: No change in appetite, no fatigue, no weight changes, no fever, chills  MOUTH and THROAT: Denies oral discomfort, gingival pain or bleeding, RESPIRATORY: no cough, SOB, DOE, wheezing, hemoptysis CARDIAC: No chest pain, edema or palpitations GI: No abdominal pain, diarrhea, constipation, heart burn, nausea or vomiting GU: Denies dysuria, frequency,  hematuria, incontinence, or discharge NEUROLOGICAL: Denies dizziness, syncope, numbness, or headache PSYCHIATRIC: Denies feelings of depression or anxiety. No report of hallucinations, insomnia, paranoia, or agitation   There is no immunization history on file for this patient. Pertinent  Health Maintenance Due  Topic Date Due   COLONOSCOPY (Pts 45-102yrs Insurance coverage will need to be confirmed)  Never done   PNA vac Low Risk Adult (1 of 2 - PCV13) Never done   INFLUENZA VACCINE  06/17/2021   No flowsheet data found.   Vitals:   06/13/21 1825  BP: 126/67  Pulse: 97  Resp: 18  Temp: (!) 97.3 F (36.3 C)  Weight: 108 lb (49 kg)  Height: 5\' 10"  (1.778 m)   Body mass index is 15.5 kg/m.  Physical Exam  GENERAL APPEARANCE:  In no acute distress. Normal body habitus SKIN:  Skin is warm and dry.  MOUTH and THROAT: Lips are without lesions. Oral mucosa is moist and without lesions.  RESPIRATORY: Breathing is even & unlabored, BS CTAB CARDIAC: RRR, no murmur,no extra heart sounds, no edema GI: Abdomen soft, normal BS, no masses, no tenderness EXTREMITIES:  Left foot tenderness NEUROLOGICAL: There is no tremor. Speech is clear.  PSYCHIATRIC:  Affect and behavior are appropriate  Labs reviewed: Recent Labs    06/08/21 0510 06/09/21 0605 06/10/21 0457 06/13/21 0551  NA 132* 136 135 137  K 4.3 4.0 3.8 3.8  CL 99 97* 100 106  CO2 GLUCOSE 109* 124* 116* 105*  BUN CREATININE 1.59* 1.44* 1.35* 1.08  CALCIUM 9.2 9.5 8.7* 9.1  MG 1.9  --   --   --   PHOS 2.7  --   --   --    No results for input(s): AST, ALT, ALKPHOS, BILITOT, PROT, ALBUMIN in the last 8760 hours. Recent Labs    06/04/21 1318 06/05/21 0415 06/10/21 0457 06/10/21 1535 06/11/21 0538 06/13/21 0551  WBC 8.7   < > 14.0*  --  12.0* 12.3*  NEUTROABS 5.1  --   --   --   --   --   HGB 11.7*   < > 8.7* 7.9* 8.5* 9.1*  HCT 38.4*   < > 27.7* 25.5* 27.1* 29.7*  MCV 98.2   < > 95.2   --  94.8 95.5  PLT 186   < > 346  --  362 586*   < > = values in this interval not displayed.   Lab Results  Component Value Date   TSH 0.260 (L) 11/03/2016   No results found for: HGBA1C No results found for: CHOL, HDL, LDLCALC, LDLDIRECT, TRIG, CHOLHDL  Significant Diagnostic Results in last 30 days:  DG Knee 1-2 Views Left  Result Date: 06/06/2021 CLINICAL DATA:  Left hip, knee, and ankle pain. EXAM: LEFT KNEE - 1-2 VIEW COMPARISON:  None. FINDINGS: No fracture. Normal alignment. Minimal medial tibiofemoral joint space narrowing. There is medial and lateral tibiofemoral chondrocalcinosis. Trace joint effusion. No erosion, bone destruction or focal bone abnormality. The bones are subjectively under mineralized. IMPRESSION: Mild medial tibiofemoral joint space narrowing and chondrocalcinosis. Trace joint effusion. 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 No erosive change. Electronically Signed   By: Narda Rutherford M.D.   On: 06/06/2021 16:14   DG Ankle 2 Views Left  Result Date: 06/06/2021 CLINICAL DATA:  Left hip, knee, and ankle pain. EXAM: LEFT ANKLE - 2 VIEW COMPARISON:  None. FINDINGS: There is no evidence of fracture, dislocation, or joint effusion. Ankle mortise is preserved. There is no evidence of arthropathy or other focal bone abnormality. Soft tissues are unremarkable. IMPRESSION: Negative radiographs of the left ankle. Electronically Signed   By: Narda Rutherford M.D.   On: 06/06/2021 16:17   CT Angio Chest PE W and/or Wo Contrast  Result Date: 06/04/2021 CLINICAL DATA:  Shortness of breath. Difficulty following commands or giving history. Suspected pulmonary embolus. EXAM: CT ANGIOGRAPHY CHEST WITH CONTRAST TECHNIQUE: Multidetector CT imaging of the chest was performed using the standard protocol during bolus administration of intravenous contrast. Multiplanar CT image reconstructions and MIPs were obtained to evaluate the vascular anatomy. CONTRAST:  OMNIPAQUE IOHEXOL  350 MG/ML SOLN COMPARISON:  None. FINDINGS: Cardiovascular: Satisfactory opacification of the pulmonary arteries to the segmental level. Markedly limited evaluation for pulmonary embolus due to motion artifact. Filling defects within the right lower, left lower, and left upper lobe segmental pulmonary arteries consistent with emboli. The main pulmonary artery is normal in caliber. No central pulmonary embolus. Normal heart size. No pericardial effusion. Mediastinum/Nodes: No enlarged mediastinal, hilar, or axillary lymph nodes. Thyroid gland, trachea, and esophagus demonstrate no significant findings. Lungs/Pleura: Centrilobular emphysematous changes. Trace biapical pleural/pulmonary scarring. Bibasilar  atelectasis. No focal consolidation. No pulmonary nodule. No pulmonary mass. No pleural effusion. No pneumothorax. Upper Abdomen: There is a 2.6 cm fluid density lesion within the left kidney that likely represents a simple renal cyst. Otherwise no acute abnormality. Musculoskeletal: No chest wall abnormality. No suspicious lytic or blastic osseous lesions. No acute displaced fracture with limited evaluation due to motion artifact. Multilevel degenerative changes of the spine. Review of the MIP images confirms the above findings. IMPRESSION: 1. Right lower, left lower, left upper lobe segmental pulmonary emboli. No associated right heart strain or pulmonary infarction. Markedly limited evaluation due to motion artifact. 2. Aortic Atherosclerosis (ICD10-I70.0) and Emphysema (ICD10-J43.9). These results were called by telephone at the time of interpretation on 06/04/2021 at 5:17 pm to provider Dr. Biagio Borg, who verbally acknowledged these results. Electronically Signed   By: Tish Frederickson M.D.   On: 06/04/2021 17:19   DG CHEST PORT 1 VIEW  Result Date: 06/07/2021 CLINICAL DATA:  70 year old male with clinical concern for pneumonia, history of recent pulmonary embolism. EXAM: PORTABLE CHEST - 1 VIEW COMPARISON:   06/04/2021 FINDINGS: The mediastinal contours are within normal limits. No cardiomegaly. Lungs are hyperinflated. The lungs are clear bilaterally without evidence of focal consolidation, pleural effusion, or pneumothorax. Atherosclerotic calcifications of the aortic arch. No acute osseous abnormality. IMPRESSION: No acute cardiopulmonary process. Aortic Atherosclerosis (ICD10-I70.0) and Emphysema (ICD10-J43.9). Electronically Signed   By: Marliss Coots MD   On: 06/07/2021 15:57   DG Chest Port 1 View  Result Date: 06/04/2021 CLINICAL DATA:  Shortness of breath. EXAM: PORTABLE CHEST 1 VIEW COMPARISON:  11/02/2016. FINDINGS: Mediastinum hilar structures normal. Lungs are clear. No pleural effusion or pneumothorax. Tiny calcified pulmonary nodules consistent granulomas. No acute bony abnormality. IMPRESSION: No acute cardiopulmonary disease. Electronically Signed   By: Maisie Fus  Register   On: 06/04/2021 13:18   ECHOCARDIOGRAM COMPLETE  Result Date: 06/05/2021    ECHOCARDIOGRAM REPORT   Patient Name:   Edwin Wright Date of Exam: 06/05/2021 Medical Rec #:  [id number]     Height:       70.0 in Accession #:    [id number]    Weight:       119.0 lb Date of Birth:  [birth year]      BSA:          1.674 m Patient Age:    69 years      BP:           146/60 mmHg Patient Gender: M             HR:           92 bpm. Exam Location:  Inpatient Procedure: 2D Echo, Cardiac Doppler and Color Doppler Indications:    Pulmonary Embolus I26.09  History:        Patient has no prior history of Echocardiogram examinations.                 Risk Factors:Hypertension.  Sonographer:    Eulah Pont RDCS Referring Phys: [id number] RAMESH KC IMPRESSIONS  1. Left ventricular ejection fraction, by estimation, is 55%. The left ventricle has normal function. The left ventricle has no regional wall motion abnormalities. Left ventricular diastolic parameters are consistent with Grade I diastolic dysfunction (impaired relaxation).  2. Right ventricular  systolic function is normal. The right ventricular size is normal. Tricuspid regurgitation signal is inadequate for assessing PA pressure.  3. The mitral valve is normal in structure. No evidence of mitral valve regurgitation. No evidence of mitral  stenosis.  4. The aortic valve is tricuspid. Aortic valve regurgitation is not visualized. No aortic stenosis is present.  5. The inferior vena cava is normal in size with greater than 50% respiratory variability, suggesting right atrial pressure of 3 mmHg. FINDINGS  Left Ventricle: Left ventricular ejection fraction, by estimation, is 55%. The left ventricle has normal function. The left ventricle has no regional wall motion abnormalities. The left ventricular internal cavity size was normal in size. There is no left ventricular hypertrophy. Left ventricular diastolic parameters are consistent with Grade I diastolic dysfunction (impaired relaxation). Right Ventricle: The right ventricular size is normal. No increase in right ventricular wall thickness. Right ventricular systolic function is normal. Tricuspid regurgitation signal is inadequate for assessing PA pressure. Left Atrium: Left atrial size was normal in size. Right Atrium: Right atrial size was normal in size. Pericardium: There is no evidence of pericardial effusion. Mitral Valve: The mitral valve is normal in structure. No evidence of mitral valve regurgitation. No evidence of mitral valve stenosis. Tricuspid Valve: The tricuspid valve is normal in structure. Tricuspid valve regurgitation is not demonstrated. Aortic Valve: The aortic valve is tricuspid. Aortic valve regurgitation is not visualized. No aortic stenosis is present. Pulmonic Valve: The pulmonic valve was normal in structure. Pulmonic valve regurgitation is not visualized. Aorta: The aortic root is normal in size and structure. Venous: The inferior vena cava is normal in size with greater than 50% respiratory variability, suggesting right atrial  pressure of 3 mmHg. IAS/Shunts: No atrial level shunt detected by color flow Doppler.  LEFT VENTRICLE PLAX 2D LVIDd:         3.53 cm  Diastology LVIDs:         2.27 cm  LV e' medial:    8.06 cm/s LV PW:         0.77 cm  LV E/e' medial:  6.3 LV IVS:        0.75 cm  LV e' lateral:   9.99 cm/s LVOT diam:     2.20 cm  LV E/e' lateral: 5.1 LV SV:         55 LV SV Index:   33 LVOT Area:     3.80 cm  RIGHT VENTRICLE RV S prime:     11.70 cm/s TAPSE (M-mode): 1.6 cm LEFT ATRIUM             Index       RIGHT ATRIUM           Index LA diam:        1.80 cm 1.08 cm/m  RA Area:     10.00 cm LA Vol (A2C):   28.1 ml 16.78 ml/m RA Volume:   21.00 ml  12.54 ml/m LA Vol (A4C):   23.5 ml 14.04 ml/m LA Biplane Vol: 26.0 ml 15.53 ml/m  AORTIC VALVE LVOT Vmax:   69.00 cm/s LVOT Vmean:  46.200 cm/s LVOT VTI:    0.144 m  AORTA Ao Root diam: 3.20 cm Ao Asc diam:  3.00 cm MITRAL VALVE MV Area (PHT): 3.42 cm    SHUNTS MV Decel Time: 222 msec    Systemic VTI:  0.14 m MV E velocity: 50.60 cm/s  Systemic Diam: 2.20 cm MV A velocity: 61.40 cm/s MV E/A ratio:  0.82 Marca Ancona MD Electronically signed by Marca Ancona MD Signature Date/Time: 06/05/2021/3:53:31 PM    Final    DG HIP UNILAT WITH PELVIS 2-3 VIEWS LEFT  Result Date: 06/06/2021 CLINICAL DATA:  Left hip,  knee, and ankle pain. EXAM: DG HIP (WITH OR WITHOUT PELVIS) 2-3V LEFT COMPARISON:  None. FINDINGS: Bones are subjectively under mineralized. Left hip joint space is preserved. There is minimal lateral acetabular spurring and chondrocalcinosis. No fracture, avascular necrosis, periosteal reaction or bone destruction. Pubic rami are intact. Pubic symphysis is congruent with chondrocalcinosis. The sacroiliac joints are congruent. IMPRESSION: 1. 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 No acute osseous abnormality. 2. Osteopenia/osteoporosis. Electronically Signed   By: Narda Rutherford M.D.   On: 06/06/2021 16:13   VAS [place] LOWER EXTREMITY VENOUS  (DVT) (ONLY [facility] & [facility] 7a-7p)  Result Date: 06/05/2021  Lower Venous DVT Study Patient Name:  Edwin Wright  Date of Exam:   06/05/2021 Medical Rec #: [id number]      Accession #:    [id number] Date of Birth: 1951/02/25       Patient Gender: M Patient Age:   69Y Exam Location:  [facility] Procedure:      VAS [place] LOWER EXTREMITY VENOUS (DVT) Referring Phys: [id number] Koleen Distance --------------------------------------------------------------------------------  Indications: Pulmonary embolism.  Risk Factors: Confirmed PE. Limitations: Poor ultrasound/tissue interface and Patient positioning. Comparison Study: No prior studies. Performing Technologist: Chanda Busing RVT  Examination Guidelines: A complete evaluation includes B-mode imaging, spectral Doppler, color Doppler, and power Doppler as needed of all accessible portions of each vessel. Bilateral testing is considered an integral part of a complete examination. Limited examinations for reoccurring indications may be performed as noted. The reflux portion of the exam is performed with the patient in reverse Trendelenburg.  +---------+---------------+---------+-----------+----------+--------------+ RIGHT    CompressibilityPhasicitySpontaneityPropertiesThrombus Aging +---------+---------------+---------+-----------+----------+--------------+ CFV      Full           Yes      Yes                                 +---------+---------------+---------+-----------+----------+--------------+ SFJ      Full                                                        +---------+---------------+---------+-----------+----------+--------------+ FV Prox  Full                                                        +---------+---------------+---------+-----------+----------+--------------+ FV Mid   Full                                                        +---------+---------------+---------+-----------+----------+--------------+ FV DistalFull                                                         +---------+---------------+---------+-----------+----------+--------------+ PFV      Full                                                        +---------+---------------+---------+-----------+----------+--------------+  POP      Full           Yes      Yes                                 +---------+---------------+---------+-----------+----------+--------------+ PTV      Full                                                        +---------+---------------+---------+-----------+----------+--------------+ PERO     Full                                                        +---------+---------------+---------+-----------+----------+--------------+   +---------+---------------+---------+-----------+----------+--------------+ LEFT     CompressibilityPhasicitySpontaneityPropertiesThrombus Aging +---------+---------------+---------+-----------+----------+--------------+ CFV      Full           Yes      Yes                                 +---------+---------------+---------+-----------+----------+--------------+ SFJ      Full                                                        +---------+---------------+---------+-----------+----------+--------------+ FV Prox  Full                                                        +---------+---------------+---------+-----------+----------+--------------+ FV Mid   Full                                                        +---------+---------------+---------+-----------+----------+--------------+ FV DistalNone           No       No                   Acute          +---------+---------------+---------+-----------+----------+--------------+ PFV      Full                                                        +---------+---------------+---------+-----------+----------+--------------+ POP      None           No       No                    Acute          +---------+---------------+---------+-----------+----------+--------------+  PTV      Partial                                      Acute          +---------+---------------+---------+-----------+----------+--------------+ PERO     Partial                                      Acute          +---------+---------------+---------+-----------+----------+--------------+ Gastroc  Full                                                        +---------+---------------+---------+-----------+----------+--------------+     Summary: RIGHT: - No evidence of common femoral vein obstruction. - No cystic structure found in the popliteal fossa.  LEFT: - Findings consistent with acute deep vein thrombosis involving the left femoral vein, left popliteal vein, left posterior tibial veins, and left peroneal veins. - No cystic structure found in the popliteal fossa.  *See table(s) above for measurements and observations. Electronically signed by Lemar LivingsBrandon Cain MD on 06/05/2021 at 1:49:06 PM.    Final     Assessment/Plan  1. Bilateral pulmonary embolism (HCC) -Continue Eliquis 5 mg twice a day  2. Protein-calorie malnutrition, severe Body mass index is 15.5 kg/m. -Continue mirtazapine 15 mg at bedtime to increase appetite -   Continue Ensure 237 mL 3 times a day for supplementation  3. Chronic obstructive pulmonary disease, unspecified COPD type (HCC) -  no wheezing, will continue to wean off O2 as tolerated  -  will start on albuterol 19 mcg/ACT 2 inhalations orally every 4 hours PRN for wheezing and SOB   4.  Acute deep vein thrombosis (DVT) of left lower extremity, unspecified vein (HCC) -   Continue Eliquis 5 mg twice a day -    will start on acetaminophen 325 mg give 2 tabs = 650 mg every 6 hours PRN for pain  5. Primary hypertension -  BP 126/67, continue hydralazine 25 mg 3 times a day and metoprolol tartrate 12.5 mg twice a day      Family/ staff Communication: Discussed  plan of care with resident and charge nurse.  Labs/tests ordered:  CBC and BMP in 1 week  Goals of care:   Short-term care   Kenard GowerMonina Medina-Vargas, DNP, MSN, FNP-BC [facility] [phone] (Monday-Friday 8:00 a.m. - 5:00 p.m.) [phone] (after hours)

## 2021-06-13 NOTE — Discharge Summary (Signed)
Physician Discharge Summary  Edwin Wright QIH:[id number] DOB: [birth year] DOA: 06/04/2021  PCP: Almetta Lov[facility], [facility]  Admit date: 06/04/2021 Discharge date: 06/13/2021  Admitted From: Home Disposition:  SNF  Recommendations for Outpatient Follow-up:  Follow up with PCP in 1 week Continue to wean O2 as able to tolerate to maintain sat > 92%  Consider outpatient palliative care follow up   Discharge Condition: Stable CODE STATUS: DNR  Diet recommendation:  Diet Orders (From admission, onward)     Start     Ordered   06/13/21 0000  Diet - low sodium heart healthy        06/13/21 1012   06/04/21 1827  Diet 2 gram sodium Room service appropriate? Yes; Fluid consistency: Thin  Diet effective now       Question Answer Comment  Room service appropriate? Yes   Fluid consistency: Thin      06/04/21 1831           Brief/Interim Summary: Edwin Wright is a 70 year old male with PMH significant for hypertension, hard of hearing presented in the ED with complaints of shortness of breath and insomnia for 2 days.  Patient reports having hiccups at home associated with worsening shortness of breath.  CT chest shows right lower, left lower and left upper lobe segmental pulmonary embolism, no right heart strain or pulmonary infarction noted.  Patient was started on heparin drip and subsequently transitioned to Eliquis.  Patient continued to remain hypoxic requiring supplemental oxygen.  Echocardiogram shows preserved RV systolic function.  Patient is very weak and deconditioned with persistent shortness of breath on exertion.  PT recommended  skilled nursing facility due to deconditioning.  Discharge Diagnoses:  Principal Problem:   Bilateral pulmonary embolism (HCC) Active Problems:   Hypertension   ARF (acute renal failure) (HCC)   Iron deficiency anemia   Pulmonary embolism (HCC)  Acute hypoxic respiratory failure secondary to acute bilateral pulmonary embolism, also with left lower  extremity DVT Patient presented with tachypnea and hypoxia requiring supplemental oxygen. He is on 2 L/min now  CTA chest showed multiple pulmonary embolus. Patient was started on IV heparin,  successfully transitioned to Eliquis. Echocardiogram shows preserved LV and RV function.   Lower extremity venous duplex shows extensive DVT in the left femoral vein and left popliteal vein left posterior tibial and left peroneal veins.  Continue adequate pain control with oxycodone. PT recommended skilled nursing facility.   Intractable hiccups Resolved   COPD No signs of acute clinical exacerbation. Continue as needed bronchodilator therapy   Hypertension Continue hydralazine, metoprolol   AKI on CKD stage IIIa Resolved, stable   Depression  Continue Remeron   Aspiration Pneumonia Completed antibiotic tx     Discharge Instructions  Discharge Instructions     Diet - low sodium heart healthy   Complete by: As directed    Increase activity slowly   Complete by: As directed       Allergies as of 06/13/2021       Reactions   Lactose Intolerance (gi)    UNK reaction        Medication List     STOP taking these medications    ondansetron 4 MG tablet Commonly known as: Zofran       TAKE these medications    apixaban 5 MG Tabs tablet Commonly known as: ELIQUIS Take 1 tablet (5 mg total) by mouth 2 (two) times daily.   brimonidine 0.2 % ophthalmic solution Commonly known as: ALPHAGAN Place  1 drop into the left eye 3 (three) times daily.   dorzolamide-timolol 22.3-6.8 MG/ML ophthalmic solution Commonly known as: COSOPT Place 1 drop into both eyes 2 (two) times daily.   feeding supplement Liqd Take 237 mLs by mouth 3 (three) times daily between meals.   hydrALAZINE 25 MG tablet Commonly known as: APRESOLINE Take 1 tablet (25 mg total) by mouth 3 (three) times daily. What changed: when to take this   latanoprost 0.005 % ophthalmic solution Commonly known  as: XALATAN Place 1 drop into both eyes at bedtime.   metoprolol tartrate 25 MG tablet Commonly known as: LOPRESSOR Take 0.5 tablets (12.5 mg total) by mouth 2 (two) times daily.   mirtazapine 15 MG tablet Commonly known as: REMERON Take 15 mg by mouth at bedtime.   pantoprazole 40 MG tablet Commonly known as: PROTONIX Take 1 tablet (40 mg total) by mouth daily.   Restasis 0.05 % ophthalmic emulsion Generic drug: cycloSPORINE Place 1 drop into both eyes 2 (two) times daily.        Contact information for follow-up providers     [facility]. Schedule an appointment as soon as possible for a visit in 1 week(s).   Specialty: Geriatric Medicine Contact information: [address] [place] [address] [phone]              Contact information for after-discharge care     Destination     [facility] Preferred SNF .   Service: Skilled Nursing Contact information: [address] [place] [address] [phone]                    Allergies  Allergen Reactions   Lactose Intolerance (Gi)     UNK reaction    Procedures/Studies: DG Knee 1-2 Views Left  Result Date: 06/06/2021 CLINICAL DATA:  Left hip, knee, and ankle pain. EXAM: LEFT KNEE - 1-2 VIEW COMPARISON:  Edwin Wright. FINDINGS: No fracture. Normal alignment. Minimal medial tibiofemoral joint space narrowing. There is medial and lateral tibiofemoral chondrocalcinosis. Trace joint effusion. No erosion, bone destruction or focal bone abnormality. The bones are subjectively under mineralized. IMPRESSION: Mild medial tibiofemoral joint space narrowing and chondrocalcinosis. Trace joint effusion. 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 No erosive change. Electronically Signed   By: Narda Rutherford M.D.   On: 06/06/2021 16:14   DG Ankle 2 Views Left  Result Date: 06/06/2021 CLINICAL DATA:  Left hip, knee, and ankle pain. EXAM: LEFT ANKLE - 2  VIEW COMPARISON:  Edwin Wright. FINDINGS: There is no evidence of fracture, dislocation, or joint effusion. Ankle mortise is preserved. There is no evidence of arthropathy or other focal bone abnormality. Soft tissues are unremarkable. IMPRESSION: Negative radiographs of the left ankle. Electronically Signed   By: Narda Rutherford M.D.   On: 06/06/2021 16:17   CT Angio Chest PE W and/or Wo Contrast  Result Date: 06/04/2021 CLINICAL DATA:  Shortness of breath. Difficulty following commands or giving history. Suspected pulmonary embolus. EXAM: CT ANGIOGRAPHY CHEST WITH CONTRAST TECHNIQUE: Multidetector CT imaging of the chest was performed using the standard protocol during bolus administration of intravenous contrast. Multiplanar CT image reconstructions and MIPs were obtained to evaluate the vascular anatomy. CONTRAST:  OMNIPAQUE IOHEXOL 350 MG/ML SOLN COMPARISON:  Edwin Wright. FINDINGS: Cardiovascular: Satisfactory opacification of the pulmonary arteries to the segmental level. Markedly limited evaluation for pulmonary embolus due to motion artifact. Filling defects within the right lower, left lower, and left upper lobe  segmental pulmonary arteries consistent with emboli. The main pulmonary artery is normal in caliber. No central pulmonary embolus. Normal heart size. No pericardial effusion. Mediastinum/Nodes: No enlarged mediastinal, hilar, or axillary lymph nodes. Thyroid gland, trachea, and esophagus demonstrate no significant findings. Lungs/Pleura: Centrilobular emphysematous changes. Trace biapical pleural/pulmonary scarring. Bibasilar atelectasis. No focal consolidation. No pulmonary nodule. No pulmonary mass. No pleural effusion. No pneumothorax. Upper Abdomen: There is a 2.6 cm fluid density lesion within the left kidney that likely represents a simple renal cyst. Otherwise no acute abnormality. Musculoskeletal: No chest wall abnormality. No suspicious lytic or blastic osseous lesions. No acute displaced  fracture with limited evaluation due to motion artifact. Multilevel degenerative changes of the spine. Review of the MIP images confirms the above findings. IMPRESSION: 1. Right lower, left lower, left upper lobe segmental pulmonary emboli. No associated right heart strain or pulmonary infarction. Markedly limited evaluation due to motion artifact. 2. Aortic Atherosclerosis (ICD10-I70.0) and Emphysema (ICD10-J43.9). These results were called by telephone at the time of interpretation on 06/04/2021 at 5:17 pm to provider Dr. Biagio Borg, who verbally acknowledged these results. Electronically Signed   By: Tish Frederickson M.D.   On: 06/04/2021 17:19   DG CHEST PORT 1 VIEW  Result Date: 06/07/2021 CLINICAL DATA:  70 year old male with clinical concern for pneumonia, history of recent pulmonary embolism. EXAM: PORTABLE CHEST - 1 VIEW COMPARISON:  06/04/2021 FINDINGS: The mediastinal contours are within normal limits. No cardiomegaly. Lungs are hyperinflated. The lungs are clear bilaterally without evidence of focal consolidation, pleural effusion, or pneumothorax. Atherosclerotic calcifications of the aortic arch. No acute osseous abnormality. IMPRESSION: No acute cardiopulmonary process. Aortic Atherosclerosis (ICD10-I70.0) and Emphysema (ICD10-J43.9). Electronically Signed   By: Marliss Coots MD   On: 06/07/2021 15:57   DG Chest Port 1 View  Result Date: 06/04/2021 CLINICAL DATA:  Shortness of breath. EXAM: PORTABLE CHEST 1 VIEW COMPARISON:  11/02/2016. FINDINGS: Mediastinum hilar structures normal. Lungs are clear. No pleural effusion or pneumothorax. Tiny calcified pulmonary nodules consistent granulomas. No acute bony abnormality. IMPRESSION: No acute cardiopulmonary disease. Electronically Signed   By: Maisie Fus  Register   On: 06/04/2021 13:18   ECHOCARDIOGRAM COMPLETE  Result Date: 06/05/2021    ECHOCARDIOGRAM REPORT   Patient Name:   Edwin Wright Date of Exam: 06/05/2021 Medical Rec #:  [id number]     Height:        70.0 in Accession #:    [id number]    Weight:       119.0 lb Date of Birth:  [birth year]      BSA:          1.674 m Patient Age:    69 years      BP:           146/60 mmHg Patient Gender: M             HR:           92 bpm. Exam Location:  Inpatient Procedure: 2D Echo, Cardiac Doppler and Color Doppler Indications:    Pulmonary Embolus I26.09  History:        Patient has no prior history of Echocardiogram examinations.                 Risk Factors:Hypertension.  Sonographer:    Eulah Pont RDCS Referring Phys: [id number] RAMESH KC IMPRESSIONS  1. Left ventricular ejection fraction, by estimation, is 55%. The left ventricle has normal function. The left ventricle has no regional wall motion abnormalities. Left ventricular diastolic parameters  are consistent with Grade I diastolic dysfunction (impaired relaxation).  2. Right ventricular systolic function is normal. The right ventricular size is normal. Tricuspid regurgitation signal is inadequate for assessing PA pressure.  3. The mitral valve is normal in structure. No evidence of mitral valve regurgitation. No evidence of mitral stenosis.  4. The aortic valve is tricuspid. Aortic valve regurgitation is not visualized. No aortic stenosis is present.  5. The inferior vena cava is normal in size with greater than 50% respiratory variability, suggesting right atrial pressure of 3 mmHg. FINDINGS  Left Ventricle: Left ventricular ejection fraction, by estimation, is 55%. The left ventricle has normal function. The left ventricle has no regional wall motion abnormalities. The left ventricular internal cavity size was normal in size. There is no left ventricular hypertrophy. Left ventricular diastolic parameters are consistent with Grade I diastolic dysfunction (impaired relaxation). Right Ventricle: The right ventricular size is normal. No increase in right ventricular wall thickness. Right ventricular systolic function is normal. Tricuspid regurgitation signal is  inadequate for assessing PA pressure. Left Atrium: Left atrial size was normal in size. Right Atrium: Right atrial size was normal in size. Pericardium: There is no evidence of pericardial effusion. Mitral Valve: The mitral valve is normal in structure. No evidence of mitral valve regurgitation. No evidence of mitral valve stenosis. Tricuspid Valve: The tricuspid valve is normal in structure. Tricuspid valve regurgitation is not demonstrated. Aortic Valve: The aortic valve is tricuspid. Aortic valve regurgitation is not visualized. No aortic stenosis is present. Pulmonic Valve: The pulmonic valve was normal in structure. Pulmonic valve regurgitation is not visualized. Aorta: The aortic root is normal in size and structure. Venous: The inferior vena cava is normal in size with greater than 50% respiratory variability, suggesting right atrial pressure of 3 mmHg. IAS/Shunts: No atrial level shunt detected by color flow Doppler.  LEFT VENTRICLE PLAX 2D LVIDd:         3.53 cm  Diastology LVIDs:         2.27 cm  LV e' medial:    8.06 cm/s LV PW:         0.77 cm  LV E/e' medial:  6.3 LV IVS:        0.75 cm  LV e' lateral:   9.99 cm/s LVOT diam:     2.20 cm  LV E/e' lateral: 5.1 LV SV:         55 LV SV Index:   33 LVOT Area:     3.80 cm  RIGHT VENTRICLE RV S prime:     11.70 cm/s TAPSE (M-mode): 1.6 cm LEFT ATRIUM             Index       RIGHT ATRIUM           Index LA diam:        1.80 cm 1.08 cm/m  RA Area:     10.00 cm LA Vol (A2C):   28.1 ml 16.78 ml/m RA Volume:   21.00 ml  12.54 ml/m LA Vol (A4C):   23.5 ml 14.04 ml/m LA Biplane Vol: 26.0 ml 15.53 ml/m  AORTIC VALVE LVOT Vmax:   69.00 cm/s LVOT Vmean:  46.200 cm/s LVOT VTI:    0.144 m  AORTA Ao Root diam: 3.20 cm Ao Asc diam:  3.00 cm MITRAL VALVE MV Area (PHT): 3.42 cm    SHUNTS MV Decel Time: 222 msec    Systemic VTI:  0.14 m MV E velocity: 50.60 cm/s  Systemic Diam: 2.20 cm MV A velocity: 61.40 cm/s MV E/A ratio:  0.82 Marca Anconaalton Mclean MD Electronically signed  by Marca Anconaalton Mclean MD Signature Date/Time: 06/05/2021/3:53:31 PM    Final    DG HIP UNILAT WITH PELVIS 2-3 VIEWS LEFT  Result Date: 06/06/2021 CLINICAL DATA:  Left hip, knee, and ankle pain. EXAM: DG HIP (WITH OR WITHOUT PELVIS) 2-3V LEFT COMPARISON:  Edwin Wright. FINDINGS: Bones are subjectively under mineralized. Left hip joint space is preserved. There is minimal lateral acetabular spurring and chondrocalcinosis. No fracture, avascular necrosis, periosteal reaction or bone destruction. Pubic rami are intact. Pubic symphysis is congruent with chondrocalcinosis. The sacroiliac joints are congruent. IMPRESSION: 1. 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 No acute osseous abnormality. 2. Osteopenia/osteoporosis. Electronically Signed   By: Narda RutherfordMelanie  Sanford M.D.   On: 06/06/2021 16:13   VAS US LOWER EXTREMITY VENOUS (DVT) (ONLY [facility] & [facility] 7a-7p)  Result Date: 06/05/2021  Lower Venous DVT Study Patient Name:  Edwin AmorDONALD Noteboom  Date of Exam:   06/05/2021 Medical Rec #: [id number]      Accession #:    [id number] Date of Birth: 07/13/1951       Patient Gender: M Patient Age:   69Y [facility] Procedure:      VAS US LOWER EXTREMITY VENOUS (DVT) Referring Phys: [id number] Koleen DistanceANNA G WRIGHT --------------------------------------------------------------------------------  Indications: Pulmonary embolism.  Risk Factors: Confirmed PE. Limitations: Poor ultrasound/tissue interface and Patient positioning. Comparison Study: No prior studies. Performing Technologist: Chanda BusingGregory Collins RVT  Examination Guidelines: A complete evaluation includes B-mode imaging, spectral Doppler, color Doppler, and power Doppler as needed of all accessible portions of each vessel. Bilateral testing is considered an integral part of a complete examination. Limited examinations for reoccurring indications may be performed as noted. The reflux portion of the exam is performed with the patient in reverse Trendelenburg.   +---------+---------------+---------+-----------+----------+--------------+ RIGHT    CompressibilityPhasicitySpontaneityPropertiesThrombus Aging +---------+---------------+---------+-----------+----------+--------------+ CFV      Full           Yes      Yes                                 +---------+---------------+---------+-----------+----------+--------------+ SFJ      Full                                                        +---------+---------------+---------+-----------+----------+--------------+ FV Prox  Full                                                        +---------+---------------+---------+-----------+----------+--------------+ FV Mid   Full                                                        +---------+---------------+---------+-----------+----------+--------------+ FV DistalFull                                                        +---------+---------------+---------+-----------+----------+--------------+  PFV      Full                                                        +---------+---------------+---------+-----------+----------+--------------+ POP      Full           Yes      Yes                                 +---------+---------------+---------+-----------+----------+--------------+ PTV      Full                                                        +---------+---------------+---------+-----------+----------+--------------+ PERO     Full                                                        +---------+---------------+---------+-----------+----------+--------------+   +---------+---------------+---------+-----------+----------+--------------+ LEFT     CompressibilityPhasicitySpontaneityPropertiesThrombus Aging +---------+---------------+---------+-----------+----------+--------------+ CFV      Full           Yes      Yes                                  +---------+---------------+---------+-----------+----------+--------------+ SFJ      Full                                                        +---------+---------------+---------+-----------+----------+--------------+ FV Prox  Full                                                        +---------+---------------+---------+-----------+----------+--------------+ FV Mid   Full                                                        +---------+---------------+---------+-----------+----------+--------------+ FV DistalNone           No       No                   Acute          +---------+---------------+---------+-----------+----------+--------------+ PFV      Full                                                        +---------+---------------+---------+-----------+----------+--------------+  POP      Edwin Wright           No       No                   Acute          +---------+---------------+---------+-----------+----------+--------------+ PTV      Partial                                      Acute          +---------+---------------+---------+-----------+----------+--------------+ PERO     Partial                                      Acute          +---------+---------------+---------+-----------+----------+--------------+ Gastroc  Full                                                        +---------+---------------+---------+-----------+----------+--------------+     Summary: RIGHT: - No evidence of common femoral vein obstruction. - No cystic structure found in the popliteal fossa.  LEFT: - Findings consistent with acute deep vein thrombosis involving the left femoral vein, left popliteal vein, left posterior tibial veins, and left peroneal veins. - No cystic structure found in the popliteal fossa.  *See table(s) above for measurements and observations. Electronically signed by Lemar Livings MD on 06/05/2021 at 1:49:06 PM.    Final        Discharge  Exam: Vitals:   06/12/21 2013 06/13/21 0501  BP: (!) 141/67 (!) 146/64  Pulse: (!) 109 (!) 103  Resp: 18 18  Temp: 98.7 F (37.1 C) 97.7 F (36.5 C)  SpO2: 97% 99%     General: Pt is alert, awake, not in acute distress, hard of hearing  Cardiovascular: RRR, S1/S2 +, no edema Respiratory: CTA bilaterally, no wheezing, no rhonchi, no respiratory distress, no conversational dyspnea, on 2L O2 Abdominal: Soft, NT, ND, bowel sounds + Extremities: no edema, no cyanosis Psych: Normal mood and affect   The results of significant diagnostics from this hospitalization (including imaging, microbiology, ancillary and laboratory) are listed below for reference.     Microbiology: Recent Results (from the past 240 hour(s))  Resp Panel by RT-PCR (Flu A&B, Covid) Nasopharyngeal Swab     Status: Edwin Wright   Collection Time: 06/04/21  1:18 PM   Specimen: Nasopharyngeal Swab; Nasopharyngeal(NP) swabs in vial transport medium  Result Value Ref Range Status   SARS Coronavirus 2 by RT PCR NEGATIVE NEGATIVE Final    Comment: (NOTE) SARS-CoV-2 target nucleic acids are NOT DETECTED.  The SARS-CoV-2 RNA is generally detectable in upper respiratory specimens during the acute phase of infection. The lowest concentration of SARS-CoV-2 viral copies this assay can detect is 138 copies/mL. A negative result does not preclude SARS-Cov-2 infection and should not be used as the sole basis for treatment or other patient management decisions. A negative result may occur with  improper specimen collection/handling, submission of specimen other than nasopharyngeal swab, presence of viral mutation(s) within the areas targeted by this assay, and inadequate number of viral copies(<138 copies/mL). A negative result must be combined with  clinical observations, patient history, and epidemiological information. The expected result is Negative.  Fact Sheet for Patients:  BloggerCourse.com  Fact  Sheet for Healthcare Providers:  SeriousBroker.it  This test is no t yet approved or cleared by the [place] FDA and  has been authorized for detection and/or diagnosis of SARS-CoV-2 by FDA under an Emergency Use Authorization (EUA). This EUA will remain  in effect (meaning this test can be used) for the duration of the COVID-19 declaration under Section 564(b)(1) of the Act, 21 U.S.C.section 360bbb-3(b)(1), unless the authorization is terminated  or revoked sooner.       Influenza A by PCR NEGATIVE NEGATIVE Final   Influenza B by PCR NEGATIVE NEGATIVE Final    Comment: (NOTE) The Xpert Xpress SARS-CoV-2/FLU/RSV plus assay is intended as an aid in the diagnosis of influenza from Nasopharyngeal swab specimens and should not be used as a sole basis for treatment. Nasal washings and aspirates are unacceptable for Xpert Xpress SARS-CoV-2/FLU/RSV testing.  Fact Sheet for Patients: BloggerCourse.com  Fact Sheet for Healthcare Providers: SeriousBroker.it  This test is not yet approved or cleared by the [place] FDA and has been authorized for detection and/or diagnosis of SARS-CoV-2 by FDA under an Emergency Use Authorization (EUA). This EUA will remain in effect (meaning this test can be used) for the duration of the COVID-19 declaration under Section 564(b)(1) of the Act, 21 U.S.C. section 360bbb-3(b)(1), unless the authorization is terminated or revoked.  Performed at [facility], [address] [place], [place] [address]   Culture, blood (routine x 2)     Status: Edwin Wright   Collection Time: 06/07/21  2:38 PM   Specimen: BLOOD  Result Value Ref Range Status   Specimen Description   Final    BLOOD RIGHT ANTECUBITAL Performed at [facility], [address] [place], [place] [address]    Special Requests   Final    BOTTLES DRAWN AEROBIC AND ANAEROBIC Blood  Culture adequate volume Performed at [facility], [address], [place], [place] [address]    Culture   Final    NO GROWTH 5 DAYS Performed at [facility], [address], [place], [place] [address]    Report Status 06/12/2021 FINAL  Final  Culture, blood (routine x 2)     Status: Edwin Wright   Collection Time: 06/07/21  2:48 PM   Specimen: BLOOD  Result Value Ref Range Status   Specimen Description   Final    BLOOD BLOOD RIGHT FOREARM Performed at [facility], [address], [place], [place] [address]    Special Requests   Final    BOTTLES DRAWN AEROBIC AND ANAEROBIC Blood Culture adequate volume Performed at [facility], [address] [place], [place] [address]    Culture   Final    NO GROWTH 5 DAYS Performed at [facility], [address], [place], [place] [address]    Report Status 06/12/2021 FINAL  Final  Urine Culture     Status: Abnormal   Collection Time: 06/08/21 12:15 AM   Specimen: Urine, Clean Catch  Result Value Ref Range Status   Specimen Description   Final    URINE, CLEAN CATCH Performed at [facility], [address], [place], [place] [address]    Special Requests   Final    Edwin Wright Performed at [facility], [address], [place], [place] [address]    Culture  MULTIPLE SPECIES PRESENT, SUGGEST RECOLLECTION (A)  Final   Report Status 06/09/2021 FINAL  Final  Resp Panel by RT-PCR (Flu A&B, Covid) Nasopharyngeal Swab     Status: Edwin Wright   Collection Time: 06/11/21  3:52 PM   Specimen: Nasopharyngeal Swab; Nasopharyngeal(NP) swabs in vial transport medium  Result Value Ref Range Status   SARS Coronavirus 2 by RT PCR NEGATIVE NEGATIVE Final    Comment: (NOTE) SARS-CoV-2 target nucleic acids are NOT DETECTED.  The SARS-CoV-2 RNA is generally detectable in upper respiratory specimens during the acute phase of infection. The lowest concentration of  SARS-CoV-2 viral copies this assay can detect is 138 copies/mL. A negative result does not preclude SARS-Cov-2 infection and should not be used as the sole basis for treatment or other patient management decisions. A negative result may occur with  improper specimen collection/handling, submission of specimen other than nasopharyngeal swab, presence of viral mutation(s) within the areas targeted by this assay, and inadequate number of viral copies(<138 copies/mL). A negative result must be combined with clinical observations, patient history, and epidemiological information. The expected result is Negative.  Fact Sheet for Patients:  BloggerCourse.com  Fact Sheet for Healthcare Providers:  SeriousBroker.it  This test is no t yet approved or cleared by the [place] FDA and  has been authorized for detection and/or diagnosis of SARS-CoV-2 by FDA under an Emergency Use Authorization (EUA). This EUA will remain  in effect (meaning this test can be used) for the duration of the COVID-19 declaration under Section 564(b)(1) of the Act, 21 U.S.C.section 360bbb-3(b)(1), unless the authorization is terminated  or revoked sooner.       Influenza A by PCR NEGATIVE NEGATIVE Final   Influenza B by PCR NEGATIVE NEGATIVE Final    Comment: (NOTE) The Xpert Xpress SARS-CoV-2/FLU/RSV plus assay is intended as an aid in the diagnosis of influenza from Nasopharyngeal swab specimens and should not be used as a sole basis for treatment. Nasal washings and aspirates are unacceptable for Xpert Xpress SARS-CoV-2/FLU/RSV testing.  Fact Sheet for Patients: BloggerCourse.com  Fact Sheet for Healthcare Providers: SeriousBroker.it  This test is not yet approved or cleared by the [place] FDA and has been authorized for detection and/or diagnosis of SARS-CoV-2 by FDA under an Emergency Use  Authorization (EUA). This EUA will remain in effect (meaning this test can be used) for the duration of the COVID-19 declaration under Section 564(b)(1) of the Act, 21 U.S.C. section 360bbb-3(b)(1), unless the authorization is terminated or revoked.  Performed at [facility], [address], [place], [place] [address]      Labs: BNP (last 3 results) Recent Labs    06/04/21 1318  BNP 20.2   Basic Metabolic Panel: Recent Labs  Lab 06/08/21 0510 06/09/21 0605 06/10/21 0457 06/13/21 0551  NA 132* 136 135 137  K 4.3 4.0 3.8 3.8  CL 99 97* 100 106  CO2 24 25 24 22   GLUCOSE 109* 124* 116* 105*  BUN 18 17 19 22   CREATININE 1.59* 1.44* 1.35* 1.08  CALCIUM 9.2 9.5 8.7* 9.1  MG 1.9  --   --   --   PHOS 2.7  --   --   --    Liver Function Tests: No results for input(s): AST, ALT, ALKPHOS, BILITOT, PROT, ALBUMIN in the last 168 hours. No results for input(s): LIPASE, AMYLASE in the last 168 hours. No results for input(s): AMMONIA in the last 168 hours. CBC: Recent Labs  Lab 06/08/21 0510 06/09/21  1610 06/10/21 0457 06/10/21 1535 06/11/21 0538 06/13/21 0551  WBC 18.1* 17.1* 14.0*  --  12.0* 12.3*  HGB 10.8* 10.5* 8.7* 7.9* 8.5* 9.1*  HCT 35.5* 33.9* 27.7* 25.5* 27.1* 29.7*  MCV 97.3 97.1 95.2  --  94.8 95.5  PLT 254 317 346  --  362 586*   Cardiac Enzymes: No results for input(s): CKTOTAL, CKMB, CKMBINDEX, TROPONINI in the last 168 hours. BNP: Invalid input(s): POCBNP CBG: No results for input(s): GLUCAP in the last 168 hours. D-Dimer No results for input(s): DDIMER in the last 72 hours. Hgb A1c No results for input(s): HGBA1C in the last 72 hours. Lipid Profile No results for input(s): CHOL, HDL, LDLCALC, TRIG, CHOLHDL, LDLDIRECT in the last 72 hours. Thyroid function studies No results for input(s): TSH, T4TOTAL, T3FREE, THYROIDAB in the last 72 hours.  Invalid input(s): FREET3 Anemia work up No results for input(s): VITAMINB12, FOLATE,  FERRITIN, TIBC, IRON, RETICCTPCT in the last 72 hours. Urinalysis    Component Value Date/Time   COLORURINE YELLOW 06/08/2021 0015   APPEARANCEUR CLEAR 06/08/2021 0015   LABSPEC 1.009 06/08/2021 0015   PHURINE 6.0 06/08/2021 0015   GLUCOSEU NEGATIVE 06/08/2021 0015   HGBUR SMALL (A) 06/08/2021 0015   BILIRUBINUR NEGATIVE 06/08/2021 0015   KETONESUR NEGATIVE 06/08/2021 0015   PROTEINUR NEGATIVE 06/08/2021 0015   NITRITE NEGATIVE 06/08/2021 0015   LEUKOCYTESUR NEGATIVE 06/08/2021 0015   Sepsis Labs Invalid input(s): PROCALCITONIN,  WBC,  LACTICIDVEN Microbiology Recent Results (from the past 240 hour(s))  Resp Panel by RT-PCR (Flu A&B, Covid) Nasopharyngeal Swab     Status: Edwin Wright   Collection Time: 06/04/21  1:18 PM   Specimen: Nasopharyngeal Swab; Nasopharyngeal(NP) swabs in vial transport medium  Result Value Ref Range Status   SARS Coronavirus 2 by RT PCR NEGATIVE NEGATIVE Final    Comment: (NOTE) SARS-CoV-2 target nucleic acids are NOT DETECTED.  The SARS-CoV-2 RNA is generally detectable in upper respiratory specimens during the acute phase of infection. The lowest concentration of SARS-CoV-2 viral copies this assay can detect is 138 copies/mL. A negative result does not preclude SARS-Cov-2 infection and should not be used as the sole basis for treatment or other patient management decisions. A negative result may occur with  improper specimen collection/handling, submission of specimen other than nasopharyngeal swab, presence of viral mutation(s) within the areas targeted by this assay, and inadequate number of viral copies(<138 copies/mL). A negative result must be combined with clinical observations, patient history, and epidemiological information. The expected result is Negative.  Fact Sheet for Patients:  BloggerCourse.com  Fact Sheet for Healthcare Providers:  SeriousBroker.it  This test is no t yet approved or  cleared by the [place] FDA and  has been authorized for detection and/or diagnosis of SARS-CoV-2 by FDA under an Emergency Use Authorization (EUA). This EUA will remain  in effect (meaning this test can be used) for the duration of the COVID-19 declaration under Section 564(b)(1) of the Act, 21 U.S.C.section 360bbb-3(b)(1), unless the authorization is terminated  or revoked sooner.       Influenza A by PCR NEGATIVE NEGATIVE Final   Influenza B by PCR NEGATIVE NEGATIVE Final    Comment: (NOTE) The Xpert Xpress SARS-CoV-2/FLU/RSV plus assay is intended as an aid in the diagnosis of influenza from Nasopharyngeal swab specimens and should not be used as a sole basis for treatment. Nasal washings and aspirates are unacceptable for Xpert Xpress SARS-CoV-2/FLU/RSV testing.  Fact Sheet for Patients: BloggerCourse.com  Fact Sheet for Healthcare  Providers: SeriousBroker.it  This test is not yet approved or cleared by the [place] and has been authorized for detection and/or diagnosis of SARS-CoV-2 by FDA under an Emergency Use Authorization (EUA). This EUA will remain in effect (meaning this test can be used) for the duration of the COVID-19 declaration under Section 564(b)(1) of the Act, 21 U.S.C. section 360bbb-3(b)(1), unless the authorization is terminated or revoked.  Performed at [facility], [address] [place], [place] [address]   Culture, blood (routine x 2)     Status: Edwin Wright   Collection Time: 06/07/21  2:38 PM   Specimen: BLOOD  Result Value Ref Range Status   Specimen Description   Final    BLOOD RIGHT ANTECUBITAL Performed at [facility], [address] [place], [place] [address]    Special Requests   Final    BOTTLES DRAWN AEROBIC AND ANAEROBIC Blood Culture adequate volume Performed at [facility], [address], [place], [place]  [address]    Culture   Final    NO GROWTH 5 DAYS Performed at [facility], [address], [place], [place] [address]    Report Status 06/12/2021 FINAL  Final  Culture, blood (routine x 2)     Status: Edwin Wright   Collection Time: 06/07/21  2:48 PM   Specimen: BLOOD  Result Value Ref Range Status   Specimen Description   Final    BLOOD BLOOD RIGHT FOREARM Performed at [facility], [address], [place], [place] [address]    Special Requests   Final    BOTTLES DRAWN AEROBIC AND ANAEROBIC Blood Culture adequate volume Performed at [facility], [address], [place], [place] [address]    Culture   Final    NO GROWTH 5 DAYS Performed at [facility], [address], [place], [place] [address]    Report Status 06/12/2021 FINAL  Final  Urine Culture     Status: Abnormal   Collection Time: 06/08/21 12:15 AM   Specimen: Urine, Clean Catch  Result Value Ref Range Status   Specimen Description   Final    URINE, CLEAN CATCH Performed at [facility], [address], [place], [place] [address]    Special Requests   Final    Edwin Wright Performed at [facility], [address], [place], [place] [address]    Culture MULTIPLE SPECIES PRESENT, SUGGEST RECOLLECTION (A)  Final   Report Status 06/09/2021 FINAL  Final  Resp Panel by RT-PCR (Flu A&B, Covid) Nasopharyngeal Swab     Status: Edwin Wright   Collection Time: 06/11/21  3:52 PM   Specimen: Nasopharyngeal Swab; Nasopharyngeal(NP) swabs in vial transport medium  Result Value Ref Range Status   SARS Coronavirus 2 by RT PCR NEGATIVE NEGATIVE Final    Comment: (NOTE) SARS-CoV-2 target nucleic acids are NOT DETECTED.  The SARS-CoV-2 RNA is generally detectable in upper respiratory specimens during the acute phase of infection. The lowest concentration of SARS-CoV-2 viral copies this assay can detect is 138 copies/mL. A negative result does not preclude  SARS-Cov-2 infection and should not be used as the sole basis for treatment or other patient management decisions. A negative result may occur with  improper specimen collection/handling, submission of specimen other than nasopharyngeal swab, presence of viral mutation(s) within the areas targeted by this assay, and inadequate number of viral copies(<138 copies/mL). A negative result must be combined with clinical observations,  patient history, and epidemiological information. The expected result is Negative.  Fact Sheet for Patients:  BloggerCourse.com  Fact Sheet for Healthcare Providers:  SeriousBroker.it  This test is no t yet approved or cleared by the [place] FDA and  has been authorized for detection and/or diagnosis of SARS-CoV-2 by FDA under an Emergency Use Authorization (EUA). This EUA will remain  in effect (meaning this test can be used) for the duration of the COVID-19 declaration under Section 564(b)(1) of the Act, 21 U.S.C.section 360bbb-3(b)(1), unless the authorization is terminated  or revoked sooner.       Influenza A by PCR NEGATIVE NEGATIVE Final   Influenza B by PCR NEGATIVE NEGATIVE Final    Comment: (NOTE) The Xpert Xpress SARS-CoV-2/FLU/RSV plus assay is intended as an aid in the diagnosis of influenza from Nasopharyngeal swab specimens and should not be used as a sole basis for treatment. Nasal washings and aspirates are unacceptable for Xpert Xpress SARS-CoV-2/FLU/RSV testing.  Fact Sheet for Patients: BloggerCourse.com  Fact Sheet for Healthcare Providers: SeriousBroker.it  This test is not yet approved or cleared by the [place] FDA and has been authorized for detection and/or diagnosis of SARS-CoV-2 by FDA under an Emergency Use Authorization (EUA). This EUA will remain in effect (meaning this test can be used) for the duration of  the COVID-19 declaration under Section 564(b)(1) of the Act, 21 U.S.C. section 360bbb-3(b)(1), unless the authorization is terminated or revoked.  Performed at [facility], [address], [place], [place] [address]      Patient was seen and examined on the day of discharge and was found to be in stable condition. Time coordinating discharge: 35 minutes including assessment and coordination of care, as well as examination of the patient.   SIGNED:  Noralee Stain, DO [facility] 06/13/2021, 10:13 AM

## 2021-06-13 NOTE — Progress Notes (Signed)
Occupational Therapy Treatment Patient Details Name: Edwin Wright MRN: [id number] DOB: 09-21-1951 Today's Date: 06/13/2021    History of present illness Edwin Wright is a 70 y.o. male came to the ED with complaint of having hiccups which is causing him to feel short of breath and unable to sleep for last 2 days. Chest CT showed a right lower, left lower and left upper lobe segmental pulmonary emboli. Pt started on heparin and admitted. PMH: glaucoma, HTN, HOH.   **06/06/21: pt found to have lower extremity edema with positive deep vein thrombosis involving the left femoral vein, left popliteal vein, left posterior tibial veins and left peroneal veins**   OT comments  Patients nurse was consulted on patients participation. Nurse reported patient is declining to eat. Patient participated in washing face at bed level with min A to complete task. Patient required mod A for supine to sit on edge of bed with fair sitting balance on edge of bed with BLE on floor. Patient was TD for attempted to stand with noted attempts to WB on LLE for standing. Patient unable to come into full standing with noted HR increase to 140 bpm. Nurse made aware. Standing was stopped and patient was transitioned back to bed with mod A for positioning in bed with increased time with patient requesting to move LLE himself. Patient maintains O2 saturation on 2L/min on this date. Patient d/c plans remain appropriate at this time.   Follow Up Recommendations  SNF    Equipment Recommendations  3 in 1 bedside commode;Other (comment)    Recommendations for Other Services      Precautions / Restrictions Precautions Precautions: Fall Precaution Comments: very HOH, low vision       Mobility Bed Mobility Overal bed mobility: Needs Assistance Bed Mobility: Rolling Rolling: Min assist   Supine to sit: Mod assist;HOB elevated Sit to supine: Min assist        Transfers Overall transfer level: Needs assistance Equipment  used: Rolling walker (2 wheeled) Transfers: Sit to/from Stand Sit to Stand: Total assist         General transfer comment: patient attempted standing x 2 with HR noted to jump to 140bpm with standing stopped. O2 maintains 96% on 2L/min. nurse made aware. patient was noted to have poor oral intake on this date by nursing.    Balance Overall balance assessment: Needs assistance Sitting-balance support: No upper extremity supported;Feet supported Sitting balance-Leahy Scale: Fair     Standing balance support: Bilateral upper extremity supported Standing balance-Leahy Scale: Zero                             ADL either performed or assessed with clinical judgement   ADL       Grooming: Minimal assistance;Bed level;Wash/dry face                                 General ADL Comments: patient completed supine to sit on edge of bed with increased time and mod A. patient was noted to have pain in LLE with slight touch when donning gripper sock. patient requested to be the one to move LLE.     Vision       Perception     Praxis      Cognition Arousal/Alertness: Awake/alert Behavior During Therapy: Flat affect Overall Cognitive Status: Difficult to assess  General Comments: HOH with patient motivated to move today        Exercises     Shoulder Instructions       General Comments      Pertinent Vitals/ Pain       Pain Assessment: Faces Faces Pain Scale: Hurts even more Pain Location: LLE Pain Descriptors / Indicators: Grimacing;Guarding Pain Intervention(s): Limited activity within patient's tolerance;Monitored during session  Home Living                                          Prior Functioning/Environment              Frequency  Min 2X/week        Progress Toward Goals  OT Goals(current goals can now be found in the care plan section)  Progress towards OT  goals: Progressing toward goals  Acute Rehab OT Goals Patient Stated Goal: sister would like for pt to recover well, return to normal functioning OT Goal Formulation: With patient/family Time For Goal Achievement: 06/20/21 Potential to Achieve Goals: Good  Plan Discharge plan remains appropriate    Co-evaluation                 AM-PAC OT "6 Clicks" Daily Activity     Outcome Measure   Help from another person eating meals?: A Little Help from another person taking care of personal grooming?: A Little Help from another person toileting, which includes using toliet, bedpan, or urinal?: A Lot Help from another person bathing (including washing, rinsing, drying)?: A Lot Help from another person to put on and taking off regular upper body clothing?: A Little Help from another person to put on and taking off regular lower body clothing?: A Lot 6 Click Score: 15    End of Session Equipment Utilized During Treatment: Gait belt;Oxygen;Rolling walker  OT Visit Diagnosis: Other abnormalities of gait and mobility (R26.89);Unsteadiness on feet (R26.81);Muscle weakness (generalized) (M62.81);Pain Pain - Right/Left: Left Pain - part of body: Leg   Activity Tolerance Patient tolerated treatment well   Patient Left in bed;with call bell/phone within reach;with chair alarm set   Nurse Communication Mobility status;Other (comment)        Time: 0109-3235 OT Time Calculation (min): 31 min  Charges: OT General Charges $OT Visit: 1 Visit OT Treatments $Self Care/Home Management : 23-37 mins  Sharyn Blitz OTR/L, MS Acute Rehabilitation Department Office# [phone] Pager# [phone]    Edwin Wright 06/13/2021, 10:48 AM

## 2021-06-14 ENCOUNTER — Non-Acute Institutional Stay (SKILLED_NURSING_FACILITY): Payer: Medicare Other | Admitting: Internal Medicine

## 2021-06-14 ENCOUNTER — Encounter: Payer: Self-pay | Admitting: Internal Medicine

## 2021-06-14 DIAGNOSIS — J690 Pneumonitis due to inhalation of food and vomit: Secondary | ICD-10-CM

## 2021-06-14 DIAGNOSIS — R29818 Other symptoms and signs involving the nervous system: Secondary | ICD-10-CM

## 2021-06-14 DIAGNOSIS — R4189 Other symptoms and signs involving cognitive functions and awareness: Secondary | ICD-10-CM

## 2021-06-14 DIAGNOSIS — E43 Unspecified severe protein-calorie malnutrition: Secondary | ICD-10-CM

## 2021-06-14 DIAGNOSIS — N179 Acute kidney failure, unspecified: Secondary | ICD-10-CM | POA: Diagnosis not present

## 2021-06-14 DIAGNOSIS — I2699 Other pulmonary embolism without acute cor pulmonale: Secondary | ICD-10-CM | POA: Diagnosis not present

## 2021-06-14 DIAGNOSIS — D649 Anemia, unspecified: Secondary | ICD-10-CM

## 2021-06-14 NOTE — Assessment & Plan Note (Addendum)
ST to monitor @ SNF; mechanical soft diet ordered. Sister to bring in his upper plate.

## 2021-06-14 NOTE — Assessment & Plan Note (Addendum)
He denies bleeding dyscrasias Monitor for bleeding dyscrasias at the SNF.

## 2021-06-14 NOTE — Progress Notes (Signed)
NURSING HOME LOCATION:  [facility] ROOM NUMBER:  [id number]  CODE STATUS:  DNR  PCP:  [facility]           [address], [address], [place] [address]  This is a comprehensive admission note to this SNFperformed on this date less than 30 days from date of admission. Included are preadmission medical/surgical history; reconciled medication list; family history; social history and comprehensive review of systems.  Corrections and additions to the records were documented. Comprehensive physical exam was also performed. Additionally a clinical summary was entered for each active diagnosis pertinent to this admission in the Problem List to enhance continuity of care.  HPI: Patient was hospitalized 7/19 - 06/13/2021 with bilateral PTE.  He presented to the ED with shortness of breath and insomnia over 2 days.  The shortness of breath was associated with hiccuping.  CT of the chest revealed right lower, left lower and left upper lobe segmental PTE.  There was no evidence of right heart strain or pulmonary infarction.  Heparin drip was initiated with subsequent transition to Eliquis.  Hypoxia necessitated supplemental oxygen.  Echo confirmed preserved RV systolic function. Lower extremity venous duplex revealed extensive DVT in the left femoral vein, left popliteal vein, left posterior tibial, and left peroneal veins. Hospital course was complicated by aspiration pneumonia for which a course of antibiotics was prescribed.  Also included AKI on CKD stage IIIa.  This resolved with fluid resuscitation. Remeron was continued for depression. Because of debilitation discharge to SNF rehab was recommended by PT/OT.  Past medical and surgical history:includes HTN , auditory deficiency, COPD & glaucoma.Surgeries include L wrist arthrotomy for infection.  Social history:non drinker; 1/2 ppd smoker PTA according to recorded history.  He tells me he smokes 3 cigarettes a day  ,1 after each meal. He was at [facility] until March of this year.  For financial reasons he moved in with his sister.  Apparently he had been independent with ADLs.  Family history:reviewed   Review of systems: Clinical neurocognitive  & severe hearing loss deficits impacted ROS completion.He scored 5/15 on BIMS indicating dementia. When I asked the reason for his hospitalization his response was that he had been "short of breath" but did not know why.  He described "shoulder and left side of the feet pain". Despite the significant anemia; he denies any bleeding dyscrasias.  Constitutional: No fever, significant weight change, fatigue  Eyes: No redness, discharge, pain, vision change ENT/mouth: No nasal congestion, purulent discharge, earache, change in hearing, sore throat  Cardiovascular: No chest pain, palpitations, paroxysmal nocturnal dyspnea, claudication, edema  Respiratory: No cough, sputum production, hemoptysis, significant snoring, apnea  Gastrointestinal: No heartburn, dysphagia, abdominal pain, nausea /vomiting, rectal bleeding, melena, change in bowels Genitourinary: No dysuria, hematuria, pyuria, incontinence, nocturia Dermatologic: No rash, pruritus, change in appearance of skin Neurologic: No dizziness, headache, syncope, seizures, numbness, tingling Psychiatric: No significant anxiety, depression, insomnia, anorexia Endocrine: No change in hair/skin/nails, excessive thirst, excessive hunger, excessive urination  Hematologic/lymphatic: No significant bruising, lymphadenopathy, abnormal bleeding Allergy/immunology: No itchy/watery eyes, significant sneezing, urticaria, angioedema  Physical exam:  Pertinent or positive findings: Despite only mild decrease in the total protein and albumin in the medical record; he appears grossly cachectic with limb atrophy and interosseous wasting.  He is profoundly hard of hearing despite wearing a hearing aid behind the left  ear.  He is on nasal oxygen.  He has pattern alopecia.  The remaining hair  is disheveled.  He has a mustache and beard.  He is edentulous except for a few mandibular teeth.  Breath sounds are decreased.  A slight gallop cadence is present.  There is trace edema at the posterior ankle.  Dorsalis pedis pulses are stronger than posterior tibial pulses.  He was weak to opposition in all extremities.  General appearance: no acute distress, increased work of breathing is present.   Lymphatic: No lymphadenopathy about the head, neck, axilla. Eyes: No conjunctival inflammation or lid edema is present. There is no scleral icterus. Ears:  External ear exam shows no significant lesions or deformities.   Nose:  External nasal examination shows no deformity or inflammation. Nasal mucosa are pink and moist without lesions, exudates Neck:  No thyromegaly, masses, tenderness noted.    Heart:  No murmur, click, rub.  Lungs: without wheezes, rhonchi, rales, rubs. Abdomen: Bowel sounds are normal.  Abdomen is soft and nontender with no organomegaly, hernias, masses. GU: Deferred  Extremities:  No cyanosis, clubbing. Neurologic exam:  Balance, Rhomberg, finger to nose testing could not be completed due to clinical state Skin: Warm & dry w/o tenting. No significant lesions or rash.  See clinical summary under each active problem in the Problem List with associated updated therapeutic plan

## 2021-06-14 NOTE — Assessment & Plan Note (Signed)
Reassess after nutrition, PT/OT & correction of hearing deficit if possible

## 2021-06-14 NOTE — Assessment & Plan Note (Signed)
Nutritionist to monitor; supplement ordered

## 2021-06-14 NOTE — Patient Instructions (Signed)
See assessment and plan under each diagnosis in the problem list and acutely for this visit 

## 2021-06-14 NOTE — Assessment & Plan Note (Signed)
CKD Stage 2 Medication List reviewed; no nephrotoxic agents identified. 

## 2021-06-16 NOTE — Assessment & Plan Note (Signed)
Currently asymptomatic on Eliquis

## 2021-06-18 ENCOUNTER — Encounter: Payer: Self-pay | Admitting: Adult Health

## 2021-06-18 ENCOUNTER — Non-Acute Institutional Stay (SKILLED_NURSING_FACILITY): Payer: Medicare Other | Admitting: Adult Health

## 2021-06-18 DIAGNOSIS — E43 Unspecified severe protein-calorie malnutrition: Secondary | ICD-10-CM | POA: Diagnosis not present

## 2021-06-18 DIAGNOSIS — I2699 Other pulmonary embolism without acute cor pulmonale: Secondary | ICD-10-CM

## 2021-06-18 DIAGNOSIS — I10 Essential (primary) hypertension: Secondary | ICD-10-CM | POA: Diagnosis not present

## 2021-06-18 DIAGNOSIS — M79605 Pain in left leg: Secondary | ICD-10-CM

## 2021-06-18 MED ORDER — TRAMADOL HCL 50 MG PO TABS
50.0000 mg | ORAL_TABLET | Freq: Two times a day (BID) | ORAL | 0 refills | Status: DC | PRN
Start: 2021-06-18 — End: 2021-07-04

## 2021-06-18 NOTE — Progress Notes (Signed)
Location:  [facility] Room Number: 116-A Place of Service:  SNF (31) Provider:  Kenard Gower, DNP, FNP-BC  Patient Care Team: [facility] as PCP - General (Geriatric Medicine)  Extended Emergency Contact Information Primary Emergency Contact: Copeland,Dorothy  [place] Home Phone: [phone] Relation: Sister  Code Status: DNR   Goals of care: Advanced Directive information Advanced Directives 06/25/2021  Does Patient Have a Medical Advance Directive? Yes  Type of Advance Directive Out of facility DNR (pink MOST or yellow form)  Does patient want to make changes to medical advance directive? -  Copy of Healthcare Power of Attorney in Chart? -  Would patient like information on creating a medical advance directive? -     Chief Complaint  Patient presents with   Acute Visit    Short-term rehabilitation    HPI:  Pt is a 70 y.o. male seen today for a short-term rehabilitation visit. He is currently having PT, OT and ST. SBPs ranging from 125 to 137, with outlier 152. He takes Metoprolol tartrate 12.5 mg BID and Hydralazine 25 mg TID for hypertension. He complains of pain when LLE is moved. He has an acute DVT on LLE and bilateral PE. He currently takes Eliquis 5 mg BID. Latest weight is 115.2 lbs, gained 0.6 lbs in 3 days. He is hard of hearing and wears hearing aide on Left ear.   Past Medical History:  Diagnosis Date   Glaucoma    HOH (hard of hearing)    Hypertension    Past Surgical History:  Procedure Laterality Date   ARTHROTOMY Left 07/21/2014   Procedure: ARTHROTOMY; IRRIGATION AND DRAINAGE OF LEFT WRIST INFECTION;  Surgeon: Eldred Manges, MD;  Location: [facility] OR;  Service: Orthopedics;  Laterality: Left;    Allergies  Allergen Reactions   Lactose Intolerance (Gi)     UNK reaction    Outpatient Encounter Medications as of 06/18/2021  Medication Sig   acetaminophen (TYLENOL) 325 MG tablet Take 650 mg by  mouth every 6 (six) hours as needed.   albuterol (VENTOLIN HFA) 108 (90 Base) MCG/ACT inhaler Inhale 2 puffs into the lungs every 4 (four) hours as needed for wheezing or shortness of breath.   apixaban (ELIQUIS) 5 MG TABS tablet Take 1 tablet (5 mg total) by mouth 2 (two) times daily.   bisacodyl (DULCOLAX) 10 MG suppository 10 mg. If not relieved by MOM, give 10 mg Bisacodyl suppositiory rectally X 1 dose in 24 hours as needed (Do not use constipation standing orders for residents with renal failure/CFR less than 30. Contact MD for orders)   brimonidine (ALPHAGAN) 0.2 % ophthalmic solution Place 1 drop into the left eye 3 (three) times daily.   dorzolamide-timolol (COSOPT) 22.3-6.8 MG/ML ophthalmic solution Place 1 drop into both eyes 2 (two) times daily.   feeding supplement, ENSURE ENLIVE, (ENSURE ENLIVE) LIQD Take 237 mLs by mouth 3 (three) times daily between meals.   hydrALAZINE (APRESOLINE) 25 MG tablet Take 1 tablet (25 mg total) by mouth 3 (three) times daily.   latanoprost (XALATAN) 0.005 % ophthalmic solution Place 1 drop into both eyes at bedtime.   magnesium hydroxide (MILK OF MAGNESIA) 400 MG/5ML suspension If no BM in 3 days, give 30 cc Milk of Magnesium p.o. x 1 dose in 24 hours as needed (Do not use standing constipation orders for residents with renal failure CFR less than 30. Contact MD for orders)   metoprolol tartrate (LOPRESSOR) 25 MG tablet Take  0.5 tablets (12.5 mg total) by mouth 2 (two) times daily.   mirtazapine (REMERON) 15 MG tablet Take 15 mg by mouth at bedtime.   NON FORMULARY MECHANICAL SOFT NAS HH, THIN LIQUIDS   pantoprazole (PROTONIX) 40 MG tablet Take 1 tablet (40 mg total) by mouth daily.   RESTASIS 0.05 % ophthalmic emulsion Place 1 drop into both eyes 2 (two) times daily.   Sodium Phosphates (RA SALINE ENEMA RE) If not relieved by Biscodyl suppository, give disposable Saline Enema rectally X 1 dose/24 hrs as needed (Do not use constipation standing orders for  residents with renal failure/CFR less than 30. Contact MD for orders)   traMADol (ULTRAM) 50 MG tablet Take 1 tablet (50 mg total) by mouth every 12 (twelve) hours as needed.   No facility-administered encounter medications on file as of 06/18/2021.    Review of Systems  Unable to obtain due to severe hard of hearing.    There is no immunization history on file for this patient. Pertinent  Health Maintenance Due  Topic Date Due   COLONOSCOPY (Pts 45-71yrs Insurance coverage will need to be confirmed)  Never done   PNA vac Low Risk Adult (1 of 2 - PCV13) Never done   INFLUENZA VACCINE  06/17/2021   No flowsheet data found.   Vitals:   06/18/21 1116  BP: 129/68  Pulse: 100  Resp: 20  Temp: 97.7 F (36.5 C)  Weight: 115 lb 3.2 oz (52.3 kg)  Height:  (1.778 m)   Body mass index is 16.53 kg/m.  Physical Exam  GENERAL APPEARANCE: Cachectic. In no acute distress.  SKIN:  Skin is warm and dry.  MOUTH and THROAT: Lips are without lesions. Oral mucosa is moist and without lesions.  RESPIRATORY: Breathing is even & unlabored, BS CTAB CARDIAC: RRR, no murmur,no extra heart sounds, LLE 1+edema GI: Abdomen soft, normal BS, no masses, no tenderness EXTREMITIES:  LLE tenderness NEUROLOGICAL: There is no tremor. Speech is clear. Hard of hearing. PSYCHIATRIC:  Affect and behavior are appropriate  Labs reviewed: Recent Labs    06/08/21 0510 06/09/21 0605 06/10/21 0457 06/13/21 0551  NA 132* 136 135 137  K 4.3 4.0 3.8 3.8  CL 99 97* 100 106  CO2 GLUCOSE 109* 124* 116* 105*  BUN CREATININE 1.59* 1.44* 1.35* 1.08  CALCIUM 9.2 9.5 8.7* 9.1  MG 1.9  --   --   --   PHOS 2.7  --   --   --    No results for input(s): AST, ALT, ALKPHOS, BILITOT, PROT, ALBUMIN in the last 8760 hours. Recent Labs    06/04/21 1318 06/05/21 0415 06/10/21 0457 06/10/21 1535 06/11/21 0538 06/13/21 0551  WBC 8.7   < > 14.0*  --  12.0* 12.3*  NEUTROABS 5.1  --   --    --   --   --   HGB 11.7*   < > 8.7* 7.9* 8.5* 9.1*  HCT 38.4*   < > 27.7* 25.5* 27.1* 29.7*  MCV 98.2   < > 95.2  --  94.8 95.5  PLT 186   < > 346  --  362 586*   < > = values in this interval not displayed.   Lab Results  Component Value Date   TSH 0.260 (L) 11/03/2016   No results found for: HGBA1C No results found for: CHOL, HDL, LDLCALC, LDLDIRECT, TRIG, CHOLHDL  Significant Diagnostic Results in last  30 days:  DG Knee 1-2 Views Left  Result Date: 06/06/2021 CLINICAL DATA:  Left hip, knee, and ankle pain. EXAM: LEFT KNEE - 1-2 VIEW COMPARISON:  None. FINDINGS: No fracture. Normal alignment. Minimal medial tibiofemoral joint space narrowing. There is medial and lateral tibiofemoral chondrocalcinosis. Trace joint effusion. No erosion, bone destruction or focal bone abnormality. The bones are subjectively under mineralized. IMPRESSION: Mild medial tibiofemoral joint space narrowing and chondrocalcinosis. Trace joint effusion. 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 No erosive change. Electronically Signed   By: Narda Rutherford M.D.   On: 06/06/2021 16:14   DG Ankle 2 Views Left  Result Date: 06/06/2021 CLINICAL DATA:  Left hip, knee, and ankle pain. EXAM: LEFT ANKLE - 2 VIEW COMPARISON:  None. FINDINGS: There is no evidence of fracture, dislocation, or joint effusion. Ankle mortise is preserved. There is no evidence of arthropathy or other focal bone abnormality. Soft tissues are unremarkable. IMPRESSION: Negative radiographs of the left ankle. Electronically Signed   By: Narda Rutherford M.D.   On: 06/06/2021 16:17   CT Angio Chest PE W and/or Wo Contrast  Result Date: 06/04/2021 CLINICAL DATA:  Shortness of breath. Difficulty following commands or giving history. Suspected pulmonary embolus. EXAM: CT ANGIOGRAPHY CHEST WITH CONTRAST TECHNIQUE: Multidetector CT imaging of the chest was performed using the standard protocol during bolus administration of intravenous contrast. Multiplanar  CT image reconstructions and MIPs were obtained to evaluate the vascular anatomy. CONTRAST:  OMNIPAQUE IOHEXOL 350 MG/ML SOLN COMPARISON:  None. FINDINGS: Cardiovascular: Satisfactory opacification of the pulmonary arteries to the segmental level. Markedly limited evaluation for pulmonary embolus due to motion artifact. Filling defects within the right lower, left lower, and left upper lobe segmental pulmonary arteries consistent with emboli. The main pulmonary artery is normal in caliber. No central pulmonary embolus. Normal heart size. No pericardial effusion. Mediastinum/Nodes: No enlarged mediastinal, hilar, or axillary lymph nodes. Thyroid gland, trachea, and esophagus demonstrate no significant findings. Lungs/Pleura: Centrilobular emphysematous changes. Trace biapical pleural/pulmonary scarring. Bibasilar atelectasis. No focal consolidation. No pulmonary nodule. No pulmonary mass. No pleural effusion. No pneumothorax. Upper Abdomen: There is a 2.6 cm fluid density lesion within the left kidney that likely represents a simple renal cyst. Otherwise no acute abnormality. Musculoskeletal: No chest wall abnormality. No suspicious lytic or blastic osseous lesions. No acute displaced fracture with limited evaluation due to motion artifact. Multilevel degenerative changes of the spine. Review of the MIP images confirms the above findings. IMPRESSION: 1. Right lower, left lower, left upper lobe segmental pulmonary emboli. No associated right heart strain or pulmonary infarction. Markedly limited evaluation due to motion artifact. 2. Aortic Atherosclerosis (ICD10-I70.0) and Emphysema (ICD10-J43.9). These results were called by telephone at the time of interpretation on 06/04/2021 at 5:17 pm to provider Dr. Biagio Borg, who verbally acknowledged these results. Electronically Signed   By: Tish Frederickson M.D.   On: 06/04/2021 17:19   DG CHEST PORT 1 VIEW  Result Date: 06/07/2021 CLINICAL DATA:  70 year old male with  clinical concern for pneumonia, history of recent pulmonary embolism. EXAM: PORTABLE CHEST - 1 VIEW COMPARISON:  06/04/2021 FINDINGS: The mediastinal contours are within normal limits. No cardiomegaly. Lungs are hyperinflated. The lungs are clear bilaterally without evidence of focal consolidation, pleural effusion, or pneumothorax. Atherosclerotic calcifications of the aortic arch. No acute osseous abnormality. IMPRESSION: No acute cardiopulmonary process. Aortic Atherosclerosis (ICD10-I70.0) and Emphysema (ICD10-J43.9). Electronically Signed   By: Marliss Coots MD   On: 06/07/2021 15:57   DG Chest Port 1 [address]  Result Date: 06/04/2021 CLINICAL DATA:  Shortness of breath. EXAM: PORTABLE CHEST 1 VIEW COMPARISON:  11/02/2016. FINDINGS: Mediastinum hilar structures normal. Lungs are clear. No pleural effusion or pneumothorax. Tiny calcified pulmonary nodules consistent granulomas. No acute bony abnormality. IMPRESSION: No acute cardiopulmonary disease. Electronically Signed   By: Maisie Fus  Register   On: 06/04/2021 13:18   ECHOCARDIOGRAM COMPLETE  Result Date: 06/05/2021    ECHOCARDIOGRAM REPORT   Patient Name:   Edwin Wright Date of Exam: 06/05/2021 Medical Rec #:  [id number]     Height:       70.0 in Accession #:    [id number]    Weight:       119.0 lb Date of Birth:  1951-02-25      BSA:          1.674 m Patient Age:    69 years      BP:           146/60 mmHg Patient Gender: M             HR:           92 bpm. Exam Location:  Inpatient Procedure: 2D Echo, Cardiac Doppler and Color Doppler Indications:    Pulmonary Embolus I26.09  History:        Patient has no prior history of Echocardiogram examinations.                 Risk Factors:Hypertension.  Sonographer:    Eulah Pont RDCS Referring Phys: [id number] RAMESH KC IMPRESSIONS  1. Left ventricular ejection fraction, by estimation, is 55%. The left ventricle has normal function. The left ventricle has no regional wall motion abnormalities. Left ventricular  diastolic parameters are consistent with Grade I diastolic dysfunction (impaired relaxation).  2. Right ventricular systolic function is normal. The right ventricular size is normal. Tricuspid regurgitation signal is inadequate for assessing PA pressure.  3. The mitral valve is normal in structure. No evidence of mitral valve regurgitation. No evidence of mitral stenosis.  4. The aortic valve is tricuspid. Aortic valve regurgitation is not visualized. No aortic stenosis is present.  5. The inferior vena cava is normal in size with greater than 50% respiratory variability, suggesting right atrial pressure of 3 mmHg. FINDINGS  Left Ventricle: Left ventricular ejection fraction, by estimation, is 55%. The left ventricle has normal function. The left ventricle has no regional wall motion abnormalities. The left ventricular internal cavity size was normal in size. There is no left ventricular hypertrophy. Left ventricular diastolic parameters are consistent with Grade I diastolic dysfunction (impaired relaxation). Right Ventricle: The right ventricular size is normal. No increase in right ventricular wall thickness. Right ventricular systolic function is normal. Tricuspid regurgitation signal is inadequate for assessing PA pressure. Left Atrium: Left atrial size was normal in size. Right Atrium: Right atrial size was normal in size. Pericardium: There is no evidence of pericardial effusion. Mitral Valve: The mitral valve is normal in structure. No evidence of mitral valve regurgitation. No evidence of mitral valve stenosis. Tricuspid Valve: The tricuspid valve is normal in structure. Tricuspid valve regurgitation is not demonstrated. Aortic Valve: The aortic valve is tricuspid. Aortic valve regurgitation is not visualized. No aortic stenosis is present. Pulmonic Valve: The pulmonic valve was normal in structure. Pulmonic valve regurgitation is not visualized. Aorta: The aortic root is normal in size and structure.  Venous: The inferior vena cava is normal in size with greater than 50% respiratory variability, suggesting right atrial pressure of 3 mmHg. IAS/Shunts:  No atrial level shunt detected by color flow Doppler.  LEFT VENTRICLE PLAX 2D LVIDd:         3.53 cm  Diastology LVIDs:         2.27 cm  LV e' medial:    8.06 cm/s LV PW:         0.77 cm  LV E/e' medial:  6.3 LV IVS:        0.75 cm  LV e' lateral:   9.99 cm/s LVOT diam:     2.20 cm  LV E/e' lateral: 5.1 LV SV:         55 LV SV Index:   33 LVOT Area:     3.80 cm  RIGHT VENTRICLE RV S prime:     11.70 cm/s TAPSE (M-mode): 1.6 cm LEFT ATRIUM             Index       RIGHT ATRIUM           Index LA diam:        1.80 cm 1.08 cm/m  RA Area:     10.00 cm LA Vol (A2C):   28.1 ml 16.78 ml/m RA Volume:   21.00 ml  12.54 ml/m LA Vol (A4C):   23.5 ml 14.04 ml/m LA Biplane Vol: 26.0 ml 15.53 ml/m  AORTIC VALVE LVOT Vmax:   69.00 cm/s LVOT Vmean:  46.200 cm/s LVOT VTI:    0.144 m  AORTA Ao Root diam: 3.20 cm Ao Asc diam:  3.00 cm MITRAL VALVE MV Area (PHT): 3.42 cm    SHUNTS MV Decel Time: 222 msec    Systemic VTI:  0.14 m MV E velocity: 50.60 cm/s  Systemic Diam: 2.20 cm MV A velocity: 61.40 cm/s MV E/A ratio:  0.82 Marca Ancona MD Electronically signed by Marca Ancona MD Signature Date/Time: 06/05/2021/3:53:31 PM    Final    DG HIP UNILAT WITH PELVIS 2-3 VIEWS LEFT  Result Date: 06/06/2021 CLINICAL DATA:  Left hip, knee, and ankle pain. EXAM: DG HIP (WITH OR WITHOUT PELVIS) 2-3V LEFT COMPARISON:  None. FINDINGS: Bones are subjectively under mineralized. Left hip joint space is preserved. There is minimal lateral acetabular spurring and chondrocalcinosis. No fracture, avascular necrosis, periosteal reaction or bone destruction. Pubic rami are intact. Pubic symphysis is congruent with chondrocalcinosis. The sacroiliac joints are congruent. IMPRESSION: 1. 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 No acute osseous abnormality. 2.  Osteopenia/osteoporosis. Electronically Signed   By: Narda Rutherford M.D.   On: 06/06/2021 16:13   VAS [place] LOWER EXTREMITY VENOUS (DVT) (ONLY [facility] & [facility] 7a-7p)  Result Date: 06/05/2021  Lower Venous DVT Study Patient Name:  Edwin Wright  Date of Exam:   06/05/2021 Medical Rec #: [id number]      Accession #:    [id number] Date of Birth: 11/07/1951       Patient Gender: M Patient Age:   69Y Exam Location[facility]  [facility] Procedure:      VAS [place] LOWER EXTREMITY VENOUS (DVT) Referring Phys: [id number] Koleen Distance --------------------------------------------------------------------------------  Indications: Pulmonary embolism.  Risk Factors: Confirmed PE. Limitations: Poor ultrasound/tissue interface and Patient positioning. Comparison Study: No prior studies. Performing Technologist: Chanda Busing RVT  Examination Guidelines: A complete evaluation includes B-mode imaging, spectral Doppler, color Doppler, and power Doppler as needed of all accessible portions of each vessel. Bilateral testing is considered an integral part of a complete examination. Limited examinations for reoccurring indications may be performed as noted. The reflux portion  of the exam is performed with the patient in reverse Trendelenburg.  +---------+---------------+---------+-----------+----------+--------------+ RIGHT    CompressibilityPhasicitySpontaneityPropertiesThrombus Aging +---------+---------------+---------+-----------+----------+--------------+ CFV      Full           Yes      Yes                                 +---------+---------------+---------+-----------+----------+--------------+ SFJ      Full                                                        +---------+---------------+---------+-----------+----------+--------------+ FV Prox  Full                                                        +---------+---------------+---------+-----------+----------+--------------+ FV Mid   Full                                                         +---------+---------------+---------+-----------+----------+--------------+ FV DistalFull                                                        +---------+---------------+---------+-----------+----------+--------------+ PFV      Full                                                        +---------+---------------+---------+-----------+----------+--------------+ POP      Full           Yes      Yes                                 +---------+---------------+---------+-----------+----------+--------------+ PTV      Full                                                        +---------+---------------+---------+-----------+----------+--------------+ PERO     Full                                                        +---------+---------------+---------+-----------+----------+--------------+   +---------+---------------+---------+-----------+----------+--------------+ LEFT     CompressibilityPhasicitySpontaneityPropertiesThrombus Aging +---------+---------------+---------+-----------+----------+--------------+ CFV      Full           Yes      Yes                                 +---------+---------------+---------+-----------+----------+--------------+  SFJ      Full                                                        +---------+---------------+---------+-----------+----------+--------------+ FV Prox  Full                                                        +---------+---------------+---------+-----------+----------+--------------+ FV Mid   Full                                                        +---------+---------------+---------+-----------+----------+--------------+ FV DistalNone           No       No                   Acute          +---------+---------------+---------+-----------+----------+--------------+ PFV      Full                                                         +---------+---------------+---------+-----------+----------+--------------+ POP      None           No       No                   Acute          +---------+---------------+---------+-----------+----------+--------------+ PTV      Partial                                      Acute          +---------+---------------+---------+-----------+----------+--------------+ PERO     Partial                                      Acute          +---------+---------------+---------+-----------+----------+--------------+ Gastroc  Full                                                        +---------+---------------+---------+-----------+----------+--------------+     Summary: RIGHT: - No evidence of common femoral vein obstruction. - No cystic structure found in the popliteal fossa.  LEFT: - Findings consistent with acute deep vein thrombosis involving the left femoral vein, left popliteal vein, left posterior tibial veins, and left peroneal veins. - No cystic structure found in the popliteal fossa.  *See table(s) above for measurements and observations. Electronically signed by Lemar Livings MD on 06/05/2021 at 1:49:06 PM.    Final  Assessment/Plan  1. Pain of left lower extremity - has LLE acute DVT -  will start Tramadol 50 mg every 12 hours PRN  2. Bilateral pulmonary embolism (HCC) -   no SOB,  continue Eliquis  3. Primary hypertension -  BPs stable, continue Metoprolol tartrate and Hydralazine  4. Protein-calorie malnutrition, severe -  gained 0.6 lbs in 3 days -   continue  Ensure for supplementation     Family/ staff Communication:   Discussed plan of care with charge nurse.  Labs/tests ordered:   None  Goals of care:   Short-term care   Kenard GowerMonina Medina-Vargas, DNP, MSN, FNP-BC [facility] [phone] (Monday-Friday 8:00 a.m. - 5:00 p.m.) [phone] (after hours)

## 2021-06-21 ENCOUNTER — Encounter: Payer: Self-pay | Admitting: Adult Health

## 2021-06-21 ENCOUNTER — Non-Acute Institutional Stay (SKILLED_NURSING_FACILITY): Payer: Medicare Other | Admitting: Adult Health

## 2021-06-21 DIAGNOSIS — R4189 Other symptoms and signs involving cognitive functions and awareness: Secondary | ICD-10-CM

## 2021-06-21 DIAGNOSIS — I82402 Acute embolism and thrombosis of unspecified deep veins of left lower extremity: Secondary | ICD-10-CM

## 2021-06-21 DIAGNOSIS — I2699 Other pulmonary embolism without acute cor pulmonale: Secondary | ICD-10-CM | POA: Diagnosis not present

## 2021-06-21 DIAGNOSIS — M79605 Pain in left leg: Secondary | ICD-10-CM | POA: Diagnosis not present

## 2021-06-21 DIAGNOSIS — R29818 Other symptoms and signs involving the nervous system: Secondary | ICD-10-CM

## 2021-06-21 DIAGNOSIS — R339 Retention of urine, unspecified: Secondary | ICD-10-CM | POA: Diagnosis not present

## 2021-06-21 LAB — BASIC METABOLIC PANEL
BUN: 14 (ref 4–21)
CO2: 23 — AB (ref 13–22)
Chloride: 102 (ref 99–108)
Creatinine: 1.3 (ref <=1.3)
Glucose: 111
Potassium: 4.4 (ref 3.4–5.3)
Sodium: 141 (ref 137–147)

## 2021-06-21 LAB — COMPREHENSIVE METABOLIC PANEL
Calcium: 9.7 (ref 8.7–10.7)
GFR calc Af Amer: 64.99
GFR calc non Af Amer: 56.08

## 2021-06-21 LAB — CBC AND DIFFERENTIAL
HCT: 26 — AB (ref 41–53)
Hemoglobin: 8.6 — AB (ref 13.5–17.5)
Platelets: 823 — AB (ref 150–399)
WBC: 10

## 2021-06-21 LAB — CBC: RBC: 2.87 — AB (ref 3.87–5.11)

## 2021-06-21 NOTE — Progress Notes (Signed)
Location:  [facility] Room Number: 116 A Place of Service:  SNF (31) Provider:  Kenard Gower, DNP, FNP-BC  Patient Care Team: [facility] as PCP - General (Geriatric Medicine)  Extended Emergency Contact Information Primary Emergency Contact: Copeland,Dorothy  [place] Home Phone: [phone] Relation: Sister  Code Status:  DNR  Goals of care: Advanced Directive information Advanced Directives 06/25/2021  Does Patient Have a Medical Advance Directive? Yes  Type of Advance Directive Out of facility DNR (pink MOST or yellow form)  Does patient want to make changes to medical advance directive? -  Copy of Healthcare Power of Attorney in Chart? -  Would patient like information on creating a medical advance directive? -     Chief Complaint  Patient presents with   Acute Visit    Acute urinary retention    HPI:  Pt is a 70 y.o. male seen today for an acute visit fo urinary retention. He was reported to have abdominal pain. KUB showed possible urinary retention. Charge nurse reported that he had incontinence episode and his diaper was changed this morning. However, he had not urinated yet and diaper was still dry. In and out catheterization and obtained 460 ml. He was prescribed Tramadol 50 mg every 12 hours PRN for LLE pain due to acute DVT. Charge nurse reported that resident does not ask for his pain medication.    Past Medical History:  Diagnosis Date   Glaucoma    HOH (hard of hearing)    Hypertension    Past Surgical History:  Procedure Laterality Date   ARTHROTOMY Left 07/21/2014   Procedure: ARTHROTOMY; IRRIGATION AND DRAINAGE OF LEFT WRIST INFECTION;  Surgeon: Eldred Manges, MD;  Location: [facility] OR;  Service: Orthopedics;  Laterality: Left;    Allergies  Allergen Reactions   Lactose Intolerance (Gi)     UNK reaction    Outpatient Encounter Medications as of 06/21/2021  Medication Sig   acetaminophen  (TYLENOL) 325 MG tablet Take 650 mg by mouth every 6 (six) hours as needed.   albuterol (VENTOLIN HFA) 108 (90 Base) MCG/ACT inhaler Inhale 2 puffs into the lungs every 4 (four) hours as needed for wheezing or shortness of breath.   apixaban (ELIQUIS) 5 MG TABS tablet Take 1 tablet (5 mg total) by mouth 2 (two) times daily.   bisacodyl (DULCOLAX) 10 MG suppository 10 mg. If not relieved by MOM, give 10 mg Bisacodyl suppositiory rectally X 1 dose in 24 hours as needed (Do not use constipation standing orders for residents with renal failure/CFR less than 30. Contact MD for orders)   brimonidine (ALPHAGAN) 0.2 % ophthalmic solution Place 1 drop into the left eye 3 (three) times daily.   dorzolamide-timolol (COSOPT) 22.3-6.8 MG/ML ophthalmic solution Place 1 drop into both eyes 2 (two) times daily.   feeding supplement, ENSURE ENLIVE, (ENSURE ENLIVE) LIQD Take 237 mLs by mouth 3 (three) times daily between meals.   hydrALAZINE (APRESOLINE) 25 MG tablet Take 1 tablet (25 mg total) by mouth 3 (three) times daily.   latanoprost (XALATAN) 0.005 % ophthalmic solution Place 1 drop into both eyes at bedtime.   magnesium hydroxide (MILK OF MAGNESIA) 400 MG/5ML suspension If no BM in 3 days, give 30 cc Milk of Magnesium p.o. x 1 dose in 24 hours as needed (Do not use standing constipation orders for residents with renal failure CFR less than 30. Contact MD for orders)   metoprolol tartrate (LOPRESSOR) 25 MG tablet  Take 0.5 tablets (12.5 mg total) by mouth 2 (two) times daily.   mirtazapine (REMERON) 15 MG tablet Take 15 mg by mouth at bedtime.   NON FORMULARY MECHANICAL SOFT NAS HH, THIN LIQUIDS   pantoprazole (PROTONIX) 40 MG tablet Take 1 tablet (40 mg total) by mouth daily.   RESTASIS 0.05 % ophthalmic emulsion Place 1 drop into both eyes 2 (two) times daily.   Sodium Phosphates (RA SALINE ENEMA RE) If not relieved by Biscodyl suppository, give disposable Saline Enema rectally X 1 dose/24 hrs as needed (Do not  use constipation standing orders for residents with renal failure/CFR less than 30. Contact MD for orders)   traMADol (ULTRAM) 50 MG tablet Take 1 tablet (50 mg total) by mouth every 12 (twelve) hours as needed.   No facility-administered encounter medications on file as of 06/21/2021.    Review of Systems   Unable to obtain due to neurocognitive deficit and hard of hearing even with hearing aid   There is no immunization history on file for this patient. Pertinent  Health Maintenance Due  Topic Date Due   COLONOSCOPY (Pts 45-47yrs Insurance coverage will need to be confirmed)  Never done   PNA vac Low Risk Adult (1 of 2 - PCV13) Never done   INFLUENZA VACCINE  06/17/2021   No flowsheet data found.   Vitals:   06/21/21 1653  BP: 124/64  Pulse: 88  Resp: 18  Temp: (!) 97.5 F (36.4 C)  Weight: 115 lb 3.2 oz (52.3 kg)  Height: 5\' 10"  (1.778 m)   Body mass index is 16.53 kg/m.  Physical Exam  GENERAL APPEARANCE:  Cachectic. SKIN:  Skin is warm and dry.  MOUTH and THROAT: Lips are without lesions. Oral mucosa is moist and without lesions.  RESPIRATORY: Breathing is even & unlabored, BS CTAB CARDIAC: RRR, no murmur,no extra heart sounds, no edema GI: Abdomen soft, normal BS, no masses, no tenderness NEUROLOGICAL: There is no tremor. Hard of hearing. PSYCHIATRIC:  Affect and behavior are appropriate  Labs reviewed: Recent Labs    06/08/21 0510 06/09/21 0605 06/10/21 0457 06/13/21 0551  NA 132* 136 135 137  K 4.3 4.0 3.8 3.8  CL 99 97* 100 106  CO2 24 25 24 22   GLUCOSE 109* 124* 116* 105*  BUN 18 17 19 22   CREATININE 1.59* 1.44* 1.35* 1.08  CALCIUM 9.2 9.5 8.7* 9.1  MG 1.9  --   --   --   PHOS 2.7  --   --   --    No results for input(s): AST, ALT, ALKPHOS, BILITOT, PROT, ALBUMIN in the last 8760 hours. Recent Labs    06/04/21 1318 06/05/21 0415 06/10/21 0457 06/10/21 1535 06/11/21 0538 06/13/21 0551  WBC 8.7   < > 14.0*  --  12.0* 12.3*  NEUTROABS 5.1   --   --   --   --   --   HGB 11.7*   < > 8.7* 7.9* 8.5* 9.1*  HCT 38.4*   < > 27.7* 25.5* 27.1* 29.7*  MCV 98.2   < > 95.2  --  94.8 95.5  PLT 186   < > 346  --  362 586*   < > = values in this interval not displayed.   Lab Results  Component Value Date   TSH 0.260 (L) 11/03/2016   No results found for: HGBA1C No results found for: CHOL, HDL, LDLCALC, LDLDIRECT, TRIG, CHOLHDL  Significant Diagnostic Results in last 30 days:  DG Knee 1-2 Views Left  Result Date: 06/06/2021 CLINICAL DATA:  Left hip, knee, and ankle pain. EXAM: LEFT KNEE - 1-2 VIEW COMPARISON:  None. FINDINGS: No fracture. Normal alignment. Minimal medial tibiofemoral joint space narrowing. There is medial and lateral tibiofemoral chondrocalcinosis. Trace joint effusion. No erosion, bone destruction or focal bone abnormality. The bones are subjectively under mineralized. IMPRESSION: Mild medial tibiofemoral joint space narrowing and chondrocalcinosis. Trace joint effusion. 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 No erosive change. Electronically Signed   By: Narda Rutherford M.D.   On: 06/06/2021 16:14   DG Ankle 2 Views Left  Result Date: 06/06/2021 CLINICAL DATA:  Left hip, knee, and ankle pain. EXAM: LEFT ANKLE - 2 VIEW COMPARISON:  None. FINDINGS: There is no evidence of fracture, dislocation, or joint effusion. Ankle mortise is preserved. There is no evidence of arthropathy or other focal bone abnormality. Soft tissues are unremarkable. IMPRESSION: Negative radiographs of the left ankle. Electronically Signed   By: Narda Rutherford M.D.   On: 06/06/2021 16:17   CT Angio Chest PE W and/or Wo Contrast  Result Date: 06/04/2021 CLINICAL DATA:  Shortness of breath. Difficulty following commands or giving history. Suspected pulmonary embolus. EXAM: CT ANGIOGRAPHY CHEST WITH CONTRAST TECHNIQUE: Multidetector CT imaging of the chest was performed using the standard protocol during bolus administration of intravenous contrast.  Multiplanar CT image reconstructions and MIPs were obtained to evaluate the vascular anatomy. CONTRAST:  OMNIPAQUE IOHEXOL 350 MG/ML SOLN COMPARISON:  None. FINDINGS: Cardiovascular: Satisfactory opacification of the pulmonary arteries to the segmental level. Markedly limited evaluation for pulmonary embolus due to motion artifact. Filling defects within the right lower, left lower, and left upper lobe segmental pulmonary arteries consistent with emboli. The main pulmonary artery is normal in caliber. No central pulmonary embolus. Normal heart size. No pericardial effusion. Mediastinum/Nodes: No enlarged mediastinal, hilar, or axillary lymph nodes. Thyroid gland, trachea, and esophagus demonstrate no significant findings. Lungs/Pleura: Centrilobular emphysematous changes. Trace biapical pleural/pulmonary scarring. Bibasilar atelectasis. No focal consolidation. No pulmonary nodule. No pulmonary mass. No pleural effusion. No pneumothorax. Upper Abdomen: There is a 2.6 cm fluid density lesion within the left kidney that likely represents a simple renal cyst. Otherwise no acute abnormality. Musculoskeletal: No chest wall abnormality. No suspicious lytic or blastic osseous lesions. No acute displaced fracture with limited evaluation due to motion artifact. Multilevel degenerative changes of the spine. Review of the MIP images confirms the above findings. IMPRESSION: 1. Right lower, left lower, left upper lobe segmental pulmonary emboli. No associated right heart strain or pulmonary infarction. Markedly limited evaluation due to motion artifact. 2. Aortic Atherosclerosis (ICD10-I70.0) and Emphysema (ICD10-J43.9). These results were called by telephone at the time of interpretation on 06/04/2021 at 5:17 pm to provider Dr. Biagio Borg, who verbally acknowledged these results. Electronically Signed   By: Tish Frederickson M.D.   On: 06/04/2021 17:19   DG CHEST PORT 1 VIEW  Result Date: 06/07/2021 CLINICAL DATA:  70 year old  male with clinical concern for pneumonia, history of recent pulmonary embolism. EXAM: PORTABLE CHEST - 1 VIEW COMPARISON:  06/04/2021 FINDINGS: The mediastinal contours are within normal limits. No cardiomegaly. Lungs are hyperinflated. The lungs are clear bilaterally without evidence of focal consolidation, pleural effusion, or pneumothorax. Atherosclerotic calcifications of the aortic arch. No acute osseous abnormality. IMPRESSION: No acute cardiopulmonary process. Aortic Atherosclerosis (ICD10-I70.0) and Emphysema (ICD10-J43.9). Electronically Signed   By: Marliss Coots MD   On: 06/07/2021 15:57   DG Chest Port 1 View  Result Date: 06/04/2021  CLINICAL DATA:  Shortness of breath. EXAM: PORTABLE CHEST 1 VIEW COMPARISON:  11/02/2016. FINDINGS: Mediastinum hilar structures normal. Lungs are clear. No pleural effusion or pneumothorax. Tiny calcified pulmonary nodules consistent granulomas. No acute bony abnormality. IMPRESSION: No acute cardiopulmonary disease. Electronically Signed   By: Maisie Fus  Register   On: 06/04/2021 13:18   ECHOCARDIOGRAM COMPLETE  Result Date: 06/05/2021    ECHOCARDIOGRAM REPORT   Patient Name:   GAUTHAM HEWINS Date of Exam: 06/05/2021 Medical Rec #:  [id number]     Height:       70.0 in Accession #:    [id number]    Weight:       119.0 lb Date of Birth:  1951/11/15      BSA:          1.674 m Patient Age:    69 years      BP:           146/60 mmHg Patient Gender: M             HR:           92 bpm. Exam Location:  Inpatient Procedure: 2D Echo, Cardiac Doppler and Color Doppler Indications:    Pulmonary Embolus I26.09  History:        Patient has no prior history of Echocardiogram examinations.                 Risk Factors:Hypertension.  Sonographer:    Eulah Pont RDCS Referring Phys: [id number] RAMESH KC IMPRESSIONS  1. Left ventricular ejection fraction, by estimation, is 55%. The left ventricle has normal function. The left ventricle has no regional wall motion abnormalities. Left  ventricular diastolic parameters are consistent with Grade I diastolic dysfunction (impaired relaxation).  2. Right ventricular systolic function is normal. The right ventricular size is normal. Tricuspid regurgitation signal is inadequate for assessing PA pressure.  3. The mitral valve is normal in structure. No evidence of mitral valve regurgitation. No evidence of mitral stenosis.  4. The aortic valve is tricuspid. Aortic valve regurgitation is not visualized. No aortic stenosis is present.  5. The inferior vena cava is normal in size with greater than 50% respiratory variability, suggesting right atrial pressure of 3 mmHg. FINDINGS  Left Ventricle: Left ventricular ejection fraction, by estimation, is 55%. The left ventricle has normal function. The left ventricle has no regional wall motion abnormalities. The left ventricular internal cavity size was normal in size. There is no left ventricular hypertrophy. Left ventricular diastolic parameters are consistent with Grade I diastolic dysfunction (impaired relaxation). Right Ventricle: The right ventricular size is normal. No increase in right ventricular wall thickness. Right ventricular systolic function is normal. Tricuspid regurgitation signal is inadequate for assessing PA pressure. Left Atrium: Left atrial size was normal in size. Right Atrium: Right atrial size was normal in size. Pericardium: There is no evidence of pericardial effusion. Mitral Valve: The mitral valve is normal in structure. No evidence of mitral valve regurgitation. No evidence of mitral valve stenosis. Tricuspid Valve: The tricuspid valve is normal in structure. Tricuspid valve regurgitation is not demonstrated. Aortic Valve: The aortic valve is tricuspid. Aortic valve regurgitation is not visualized. No aortic stenosis is present. Pulmonic Valve: The pulmonic valve was normal in structure. Pulmonic valve regurgitation is not visualized. Aorta: The aortic root is normal in size and  structure. Venous: The inferior vena cava is normal in size with greater than 50% respiratory variability, suggesting right atrial pressure of 3 mmHg. IAS/Shunts: No atrial level  shunt detected by color flow Doppler.  LEFT VENTRICLE PLAX 2D LVIDd:         3.53 cm  Diastology LVIDs:         2.27 cm  LV e' medial:    8.06 cm/s LV PW:         0.77 cm  LV E/e' medial:  6.3 LV IVS:        0.75 cm  LV e' lateral:   9.99 cm/s LVOT diam:     2.20 cm  LV E/e' lateral: 5.1 LV SV:         55 LV SV Index:   33 LVOT Area:     3.80 cm  RIGHT VENTRICLE RV S prime:     11.70 cm/s TAPSE (M-mode): 1.6 cm LEFT ATRIUM             Index       RIGHT ATRIUM           Index LA diam:        1.80 cm 1.08 cm/m  RA Area:     10.00 cm LA Vol (A2C):   28.1 ml 16.78 ml/m RA Volume:   21.00 ml  12.54 ml/m LA Vol (A4C):   23.5 ml 14.04 ml/m LA Biplane Vol: 26.0 ml 15.53 ml/m  AORTIC VALVE LVOT Vmax:   69.00 cm/s LVOT Vmean:  46.200 cm/s LVOT VTI:    0.144 m  AORTA Ao Root diam: 3.20 cm Ao Asc diam:  3.00 cm MITRAL VALVE MV Area (PHT): 3.42 cm    SHUNTS MV Decel Time: 222 msec    Systemic VTI:  0.14 m MV E velocity: 50.60 cm/s  Systemic Diam: 2.20 cm MV A velocity: 61.40 cm/s MV E/A ratio:  0.82 Marca Ancona MD Electronically signed by Marca Ancona MD Signature Date/Time: 06/05/2021/3:53:31 PM    Final    DG HIP UNILAT WITH PELVIS 2-3 VIEWS LEFT  Result Date: 06/06/2021 CLINICAL DATA:  Left hip, knee, and ankle pain. EXAM: DG HIP (WITH OR WITHOUT PELVIS) 2-3V LEFT COMPARISON:  None. FINDINGS: Bones are subjectively under mineralized. Left hip joint space is preserved. There is minimal lateral acetabular spurring and chondrocalcinosis. No fracture, avascular necrosis, periosteal reaction or bone destruction. Pubic rami are intact. Pubic symphysis is congruent with chondrocalcinosis. The sacroiliac joints are congruent. IMPRESSION: 1. 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 No acute osseous abnormality. 2.  Osteopenia/osteoporosis. Electronically Signed   By: Narda Rutherford M.D.   On: 06/06/2021 16:13   VAS [place] LOWER EXTREMITY VENOUS (DVT) (ONLY [facility] & [facility] 7a-7p)  Result Date: 06/05/2021  Lower Venous DVT Study Patient Name:  JIMMYLEE RATTERREE  Date of Exam:   06/05/2021 Medical Rec #: [id number]      Accession #:    [id number] Date of Birth: 1951/04/25       Patient Gender: M Patient Age:   69Y [facility]am [facility] Procedure:      VAS [place] LOWER EXTREMITY VENOUS (DVT) Referring Phys: [id number] Koleen Distance --------------------------------------------------------------------------------  Indications: Pulmonary embolism.  Risk Factors: Confirmed PE. Limitations: Poor ultrasound/tissue interface and Patient positioning. Comparison Study: No prior studies. Performing Technologist: Chanda Busing RVT  Examination Guidelines: A complete evaluation includes B-mode imaging, spectral Doppler, color Doppler, and power Doppler as needed of all accessible portions of each vessel. Bilateral testing is considered an integral part of a complete examination. Limited examinations for reoccurring indications may be performed as noted. The reflux portion of the exam  is performed with the patient in reverse Trendelenburg.  +---------+---------------+---------+-----------+----------+--------------+ RIGHT    CompressibilityPhasicitySpontaneityPropertiesThrombus Aging +---------+---------------+---------+-----------+----------+--------------+ CFV      Full           Yes      Yes                                 +---------+---------------+---------+-----------+----------+--------------+ SFJ      Full                                                        +---------+---------------+---------+-----------+----------+--------------+ FV Prox  Full                                                        +---------+---------------+---------+-----------+----------+--------------+ FV Mid   Full                                                         +---------+---------------+---------+-----------+----------+--------------+ FV DistalFull                                                        +---------+---------------+---------+-----------+----------+--------------+ PFV      Full                                                        +---------+---------------+---------+-----------+----------+--------------+ POP      Full           Yes      Yes                                 +---------+---------------+---------+-----------+----------+--------------+ PTV      Full                                                        +---------+---------------+---------+-----------+----------+--------------+ PERO     Full                                                        +---------+---------------+---------+-----------+----------+--------------+   +---------+---------------+---------+-----------+----------+--------------+ LEFT     CompressibilityPhasicitySpontaneityPropertiesThrombus Aging +---------+---------------+---------+-----------+----------+--------------+ CFV      Full           Yes      Yes                                 +---------+---------------+---------+-----------+----------+--------------+  SFJ      Full                                                        +---------+---------------+---------+-----------+----------+--------------+ FV Prox  Full                                                        +---------+---------------+---------+-----------+----------+--------------+ FV Mid   Full                                                        +---------+---------------+---------+-----------+----------+--------------+ FV DistalNone           No       No                   Acute          +---------+---------------+---------+-----------+----------+--------------+ PFV      Full                                                         +---------+---------------+---------+-----------+----------+--------------+ POP      None           No       No                   Acute          +---------+---------------+---------+-----------+----------+--------------+ PTV      Partial                                      Acute          +---------+---------------+---------+-----------+----------+--------------+ PERO     Partial                                      Acute          +---------+---------------+---------+-----------+----------+--------------+ Gastroc  Full                                                        +---------+---------------+---------+-----------+----------+--------------+     Summary: RIGHT: - No evidence of common femoral vein obstruction. - No cystic structure found in the popliteal fossa.  LEFT: - Findings consistent with acute deep vein thrombosis involving the left femoral vein, left popliteal vein, left posterior tibial veins, and left peroneal veins. - No cystic structure found in the popliteal fossa.  *See table(s) above for measurements and observations. Electronically signed by Lemar Livings MD on 06/05/2021 at 1:49:06 PM.    Final  Assessment/Plan  1. Urinary retention -   will insert foley catheter and monitor urine output  2. Pain of left lower extremity -  due to acute LLE acute DVT -  will give Tramadol 50 mg BID X 5 days then PRN  3. Bilateral pulmonary embolism (HCC) -  no SOB, continue Eliquis  4. Acute deep vein thrombosis (DVT) of left lower extremity, unspecified vein (HCC) -  continue Eliquis  5. Neurocognitive deficits -   BIMS score 5/15, ranging in severe cognitive impairment -   continue supportive care     Family/ staff Communication:   Discussed plan of care with charge nurse.  Labs/tests ordered:    Urinalysis with culture and sensitivity  Goals of care:   Short-term care   Kenard GowerMonina Medina-Vargas, DNP, MSN, FNP-BC [facility] [phone] (Monday-Friday 8:00 a.m. - 5:00 p.m.) [phone] (after hours)

## 2021-06-25 ENCOUNTER — Encounter: Payer: Self-pay | Admitting: Internal Medicine

## 2021-06-25 ENCOUNTER — Non-Acute Institutional Stay (SKILLED_NURSING_FACILITY): Payer: Medicare Other | Admitting: Internal Medicine

## 2021-06-25 DIAGNOSIS — U071 COVID-19: Secondary | ICD-10-CM | POA: Insufficient documentation

## 2021-06-25 NOTE — Progress Notes (Signed)
   NURSING HOME LOCATION:  [facility] ROOM NUMBER:  [id number]  CODE STATUS:  DNR  PCP:  [facility]  This is a nursing facility follow up visit for specific acute issue of positive Covid 19 PCR screening test.  Interim medical record and care since last SNF visit was updated with review of diagnostic studies and change in clinical status since last visit were documented.  HPI: He was admitted to this facility 06/13/2021 after being hospitalized 7/19-7/28 with bilateral PTE.  CT revealed right lower, left lower, and left upper lobe segmental PTE's.  There was no evidence of right heart strain or pulmonary infarction.  Heparin was transitioned to Eliquis.  He received supplemental oxygen because of hypoxia.  Doppler revealed extensive DVT in the left femoral vein, left popliteal vein, left posterior tibial, and left peroneal veins. Hospital course was complicated by aspiration pneumonia for which he received antibiotics.  Also he had AKI superimposed on CKD; this resolved with fluid resuscitation. As part of a routine screening protocol he was tested for COVID and was found to be positive yesterday prompting transfer to the quarantine area.  Review of systems: Dementia and profound deafness despite wearing a single hearing aid; prevented completion of review of systems.  Physical exam:  Pertinent or positive findings: When I entered the room OT was working with him but he could not follow commands because of the deafness.  She had to shout to get him to understand she wanted him to sit on the bed.  He is wearing nasal oxygen.  He is grossly cachectic with limb and interosseous wasting.  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  Intraoral exam could not be completed as I could not get him to open his mouth.  He is missing some mandibular teeth and they are coated from the limited visualization I had.  He exhibits a tachycardia with respiratory variation to his rhythm.  Breath  sounds are markedly decreased posteriorly.  He has low-grade rales anteriorly.  Abdomen is scaphoid.  The posterior tibial pulses are stronger than the dorsalis pedis pulses.  General appearance:  no acute distress, increased work of breathing is present.   Lymphatic: No lymphadenopathy about the head, neck, axilla. Eyes: No conjunctival inflammation or lid edema is present. There is no scleral icterus. Ears:  External ear exam shows no significant lesions or deformities.   Nose:  External nasal examination shows no deformity or inflammation. Nasal mucosa are pink and moist without lesions, exudates Neck:  No thyromegaly, masses, tenderness noted.    Heart:  No murmur, click, rub .  Lungs:  without wheezes, rhonchi, rubs. Abdomen: Bowel sounds are normal. Abdomen is soft and nontender with no organomegaly, hernias, masses. GU: Deferred  Extremities:  No cyanosis, clubbing, edema  Neurologic exam :Balance, Rhomberg, finger to nose testing could not be completed due to clinical state Skin: Warm & dry w/o tenting. No significant lesions or rash.  See summary under each active problem in the Problem List with associated updated therapeutic plan

## 2021-06-25 NOTE — Assessment & Plan Note (Addendum)
D dimer not ordered as he is on Eliquis for recent multiple PTEs & diffuse DVTs. Adequate oxygenation @ present Treat with short course of steroids, Doxycycline & Molnupiravir 800 mg bid  X 5 days

## 2021-06-25 NOTE — Patient Instructions (Signed)
See assessment and plan under acute diagnosis for this visit 

## 2021-06-27 ENCOUNTER — Encounter: Payer: Self-pay | Admitting: Internal Medicine

## 2021-06-27 DIAGNOSIS — N390 Urinary tract infection, site not specified: Secondary | ICD-10-CM | POA: Insufficient documentation

## 2021-07-04 ENCOUNTER — Other Ambulatory Visit: Payer: Self-pay | Admitting: Adult Health

## 2021-07-04 MED ORDER — TRAMADOL HCL 50 MG PO TABS: 50.0000 mg | ORAL_TABLET | Freq: Two times a day (BID) | ORAL | 0 refills | Status: DC | PRN

## 2021-07-08 ENCOUNTER — Encounter: Payer: Self-pay | Admitting: Adult Health

## 2021-07-08 ENCOUNTER — Non-Acute Institutional Stay (SKILLED_NURSING_FACILITY): Payer: Medicare Other | Admitting: Adult Health

## 2021-07-08 DIAGNOSIS — I10 Essential (primary) hypertension: Secondary | ICD-10-CM

## 2021-07-08 DIAGNOSIS — N3000 Acute cystitis without hematuria: Secondary | ICD-10-CM

## 2021-07-08 DIAGNOSIS — R634 Abnormal weight loss: Secondary | ICD-10-CM

## 2021-07-08 NOTE — Progress Notes (Signed)
Location:  [facility] Room Number: 222-B Place of Service:  SNF (31) Provider:  Kenard Gower, DNP, FNP-BC  Patient Care Team: [facility], [facility] as PCP - General (Geriatric Medicine)  Extended Emergency Contact Information Primary Emergency Contact: Copeland,Dorothy  [place] Home Phone: [phone] Relation: Sister  Code Status: DNR   Goals of care: Advanced Directive information Advanced Directives 07/08/2021  Does Patient Have a Medical Advance Directive? Yes  Type of Advance Directive Out of facility DNR (pink MOST or yellow form)  Does patient want to make changes to medical advance directive? No - Patient declined  Copy of Healthcare Power of Attorney in Chart? -  Would patient like information on creating a medical advance directive? -     Chief Complaint  Patient presents with   Acute Visit    Weight loss.    HPI:  Pt is a 70 y.o. male seen today for weight loss. He is currently having short-term rehabilitation post hospital admission 06/04/21 to 06/13/21 for bilateral pulmonary embolism and left lower extremity DVT.  He is currently taking Eliquis 5 mg twice a day.  Latest weight is 98 lbs.  He had 17.2 lbs or 40.98% weight loss in 17 days.  He is currently taking Remeron 15 mg at bedtime to increase his appetite.SBPs ranging from 109 to 128, with an outlier 88. He is currently taking metoprolol tartrate 12.5 mg twice a day and hydralazine 25 mg 3 times a day. He had recently completed antibiotic treatment for UTI. Foley catheter was inserted due to urinary retention.   Past Medical History:  Diagnosis Date   Glaucoma    HOH (hard of hearing)    Hypertension    Past Surgical History:  Procedure Laterality Date   ARTHROTOMY Left 07/21/2014   Procedure: ARTHROTOMY; IRRIGATION AND DRAINAGE OF LEFT WRIST INFECTION;  Surgeon: Eldred Manges, MD;  Location: [facility] OR;  Service: Orthopedics;  Laterality: Left;     Allergies  Allergen Reactions   Lactose Intolerance (Gi)     UNK reaction    Outpatient Encounter Medications as of 07/08/2021  Medication Sig   acetaminophen (TYLENOL) 325 MG tablet Take 650 mg by mouth every 6 (six) hours as needed.   albuterol (VENTOLIN HFA) 108 (90 Base) MCG/ACT inhaler Inhale 2 puffs into the lungs every 4 (four) hours as needed for wheezing or shortness of breath.   apixaban (ELIQUIS) 5 MG TABS tablet Take 1 tablet (5 mg total) by mouth 2 (two) times daily.   bisacodyl (DULCOLAX) 10 MG suppository 10 mg. If not relieved by MOM, give 10 mg Bisacodyl suppositiory rectally X 1 dose in 24 hours as needed (Do not use constipation standing orders for residents with renal failure/CFR less than 30. Contact MD for orders)   brimonidine (ALPHAGAN) 0.2 % ophthalmic solution Place 1 drop into the left eye 3 (three) times daily.   dorzolamide-timolol (COSOPT) 22.3-6.8 MG/ML ophthalmic solution Place 1 drop into both eyes 2 (two) times daily.   feeding supplement, ENSURE ENLIVE, (ENSURE ENLIVE) LIQD Take 237 mLs by mouth 3 (three) times daily between meals.   hydrALAZINE (APRESOLINE) 25 MG tablet Take 1 tablet (25 mg total) by mouth 3 (three) times daily.   latanoprost (XALATAN) 0.005 % ophthalmic solution Place 1 drop into both eyes at bedtime.   magnesium hydroxide (MILK OF MAGNESIA) 400 MG/5ML suspension If no BM in 3 days, give 30 cc Milk of Magnesium p.o. x 1 dose in 24 hours  as needed (Do not use standing constipation orders for residents with renal failure CFR less than 30. Contact MD for orders)   metoprolol tartrate (LOPRESSOR) 25 MG tablet Take 0.5 tablets (12.5 mg total) by mouth 2 (two) times daily.   mirtazapine (REMERON) 15 MG tablet Take 15 mg by mouth at bedtime.   NON FORMULARY MECHANICAL SOFT NAS HH, THIN LIQUIDS   OXYGEN Inhale 2 L into the lungs. O2 at 2L/MIN VIA NASAL CANNULA CONTINUOUSLY   pantoprazole (PROTONIX) 40 MG tablet Take 1 tablet (40 mg total) by  mouth daily.   RESTASIS 0.05 % ophthalmic emulsion Place 1 drop into both eyes 2 (two) times daily.   sennosides-docusate sodium (SENOKOT-S) 8.6-50 MG tablet Take 2 tablets by mouth 2 (two) times daily as needed for constipation.   Sodium Phosphates (RA SALINE ENEMA RE) If not relieved by Biscodyl suppository, give disposable Saline Enema rectally X 1 dose/24 hrs as needed (Do not use constipation standing orders for residents with renal failure/CFR less than 30. Contact MD for orders)   traMADol (ULTRAM) 50 MG tablet Take 1 tablet (50 mg total) by mouth every 12 (twelve) hours as needed.   No facility-administered encounter medications on file as of 07/08/2021.    Review of Systems    GENERAL: + weight changes, MOUTH and THROAT: Denies oral discomfort, gingival pain or bleeding RESPIRATORY: no cough, SOB, DOE, wheezing, hemoptysis CARDIAC: No chest pain, edema or palpitations GI: No abdominal pain, diarrhea, constipation, heart burn, nausea or vomiting NEUROLOGICAL: Denies dizziness, syncope, numbness, or headache PSYCHIATRIC: Denies feelings of depression or anxiety. No report of hallucinations, insomnia, paranoia, or agitation    There is no immunization history on file for this patient. Pertinent  Health Maintenance Due  Topic Date Due   COLONOSCOPY (Pts 45-63yrs Insurance coverage will need to be confirmed)  Never done   PNA vac Low Risk Adult (1 of 2 - PCV13) Never done   INFLUENZA VACCINE  06/17/2021   No flowsheet data found.   Vitals:   07/08/21 1553  BP: 108/69  Pulse: 74  Resp: 20  Temp: 98.3 F (36.8 C)  Weight: 98 lb (44.5 kg)  Height: 5\' 10"  (1.778 m)   Body mass index is 14.06 kg/m.  Physical Exam  GENERAL APPEARANCE:  In no acute distress.  SKIN:  Skin is warm and dry.  MOUTH and THROAT: Lips are without lesions. Oral mucosa is moist and without lesions.  RESPIRATORY: Breathing is even & unlabored, BS CTAB CARDIAC: RRR, no murmur,no extra heart sounds,  no edema GI: Abdomen soft, normal BS, no masses, no tenderness NEUROLOGICAL: There is no tremor. Speech is clear. Alert to self, disoriented to time and place. PSYCHIATRIC:  Affect and behavior are appropriate  Labs reviewed: Recent Labs    06/08/21 0510 06/09/21 0605 06/10/21 0457 06/13/21 0551 06/21/21 0000  NA 132* 136 135 137 141  K 4.3 4.0 3.8 3.8 4.4  CL 99 97* 100 106 102  CO2 24 25 24 22  23*  GLUCOSE 109* 124* 116* 105*  --   BUN 18 17 19 22 14   CREATININE 1.59* 1.44* 1.35* 1.08 1.3  CALCIUM 9.2 9.5 8.7* 9.1 9.7  MG 1.9  --   --   --   --   PHOS 2.7  --   --   --   --    No results for input(s): AST, ALT, ALKPHOS, BILITOT, PROT, ALBUMIN in the last 8760 hours. Recent Labs    06/04/21  1318 06/05/21 0415 06/10/21 0457 06/10/21 1535 06/11/21 0538 06/13/21 0551 06/21/21 0000  WBC 8.7   < > 14.0*  --  12.0* 12.3* 10.0  NEUTROABS 5.1  --   --   --   --   --   --   HGB 11.7*   < > 8.7*   < > 8.5* 9.1* 8.6*  HCT 38.4*   < > 27.7*   < > 27.1* 29.7* 26*  MCV 98.2   < > 95.2  --  94.8 95.5  --   PLT 186   < > 346  --  362 586* 823*   < > = values in this interval not displayed.   Lab Results  Component Value Date   TSH 0.260 (L) 11/03/2016   No results found for: HGBA1C No results found for: CHOL, HDL, LDLCALC, LDLDIRECT, TRIG, CHOLHDL  Significant Diagnostic Results in last 30 days:  No results found.  Assessment/Plan  1. Weight loss Wt Readings from Last 3 Encounters:  07/08/21 98 lb (44.5 kg)  06/21/21 115 lb 3.2 oz (52.3 kg)  06/18/21 115 lb 3.2 oz (52.3 kg)   -   continue Remeron and Ensure  2. Primary hypertension -  BPs soft, will decrease Hydralazine from 25 mg TID to 10 mg TID -   continue Metoprolol tartrate  3. Acute cystitis without hematuria -  completed antibiotics -   will do trial discontinuation of foley catheter     Family/ staff Communication:   Discussed plan of care with resident and charge nurse.  Labs/tests ordered:  None  Goals of care:   Short-term care   Kenard Gower, DNP, MSN, FNP-BC [facility] [phone] (Monday-Friday 8:00 a.m. - 5:00 p.m.) [phone] (after hours)

## 2021-07-11 LAB — CBC AND DIFFERENTIAL
HCT: 26 — AB (ref 41–53)
Hemoglobin: 8.3 — AB (ref 13.5–17.5)
Platelets: 380 (ref 150–399)
WBC: 14.7

## 2021-07-11 LAB — BASIC METABOLIC PANEL
BUN: 30 — AB (ref 4–21)
CO2: 26 — AB (ref 13–22)
Chloride: 99 (ref 99–108)
Creatinine: 1.7 — AB (ref <=1.3)
Glucose: 133
Potassium: 4.1 (ref 3.4–5.3)
Sodium: 138 (ref 137–147)

## 2021-07-11 LAB — COMPREHENSIVE METABOLIC PANEL
Albumin: 3 — AB (ref 3.5–5.0)
Calcium: 9.6 (ref 8.7–10.7)
GFR calc Af Amer: 71.74
GFR calc non Af Amer: 61.9
Globulin: 3.5

## 2021-07-11 LAB — HEPATIC FUNCTION PANEL
ALT: 10 (ref 10–40)
AST: 25 (ref 14–40)
Alkaline Phosphatase: 158 — AB (ref 25–125)
Bilirubin, Total: 0.5

## 2021-07-11 LAB — CBC: RBC: 3.03 — AB (ref 3.87–5.11)

## 2021-07-12 ENCOUNTER — Encounter: Payer: Self-pay | Admitting: Adult Health

## 2021-07-12 ENCOUNTER — Non-Acute Institutional Stay (SKILLED_NURSING_FACILITY): Payer: Medicare Other | Admitting: Adult Health

## 2021-07-12 DIAGNOSIS — I10 Essential (primary) hypertension: Secondary | ICD-10-CM

## 2021-07-12 DIAGNOSIS — D649 Anemia, unspecified: Secondary | ICD-10-CM

## 2021-07-12 DIAGNOSIS — R339 Retention of urine, unspecified: Secondary | ICD-10-CM | POA: Diagnosis not present

## 2021-07-12 DIAGNOSIS — N390 Urinary tract infection, site not specified: Secondary | ICD-10-CM | POA: Diagnosis not present

## 2021-07-12 NOTE — Progress Notes (Signed)
Location:  [facility] Room Number: 222-B Place of Service:  SNF (31) Provider:  Kenard Gower, DNP, FNP-BC  Patient Care Team: [facility], [facility] as PCP - General (Geriatric Medicine)  Extended Emergency Contact Information Primary Emergency Contact: Copeland,Dorothy  [place] Home Phone: [phone] Relation: Sister  Code Status: DNR   Goals of care: Advanced Directive information Advanced Directives 07/12/2021  Does Patient Have a Medical Advance Directive? Yes  Type of Advance Directive Out of facility DNR (pink MOST or yellow form)  Does patient want to make changes to medical advance directive? No - Patient declined  Copy of Healthcare Power of Attorney in Chart? -  Would patient like information on creating a medical advance directive? -     Chief Complaint  Patient presents with   Acute Visit    Urinary retention    HPI:  Pt is a 70 y.o. male seen today for an acute visit. In and out catheterization was done and obtained 600 cc of urine. Foley catheter was retained. Noted urine to be tea-colored with sediments. Labs ordered showed  creatinine 1.17, K 4.1, GFR 71.74, wbc 14.7 and hgb 8.3. He has recently completed antibiotics for UTI. There was no reported fever nor cough. SBPs ranging from 105 to 144, HR ranging from 76 to 100.Marland Kitchen He currently takes metoprolol tartrate 12.5 mg twice a day and hydralazine 10 mg 3 times a day for hypertension.   Past Medical History:  Diagnosis Date   Glaucoma    HOH (hard of hearing)    Hypertension    Past Surgical History:  Procedure Laterality Date   ARTHROTOMY Left 07/21/2014   Procedure: ARTHROTOMY; IRRIGATION AND DRAINAGE OF LEFT WRIST INFECTION;  Surgeon: Eldred Manges, MD;  Location: [facility] OR;  Service: Orthopedics;  Laterality: Left;    Allergies  Allergen Reactions   Lactose Intolerance (Gi)     UNK reaction    Outpatient Encounter Medications as of 07/12/2021   Medication Sig   acetaminophen (TYLENOL) 325 MG tablet Take 650 mg by mouth every 6 (six) hours as needed.   albuterol (VENTOLIN HFA) 108 (90 Base) MCG/ACT inhaler Inhale 2 puffs into the lungs every 4 (four) hours as needed for wheezing or shortness of breath.   apixaban (ELIQUIS) 5 MG TABS tablet Take 1 tablet (5 mg total) by mouth 2 (two) times daily.   bisacodyl (DULCOLAX) 10 MG suppository 10 mg. If not relieved by MOM, give 10 mg Bisacodyl suppositiory rectally X 1 dose in 24 hours as needed (Do not use constipation standing orders for residents with renal failure/CFR less than 30. Contact MD for orders)   brimonidine (ALPHAGAN) 0.2 % ophthalmic solution Place 1 drop into the left eye 3 (three) times daily.   dorzolamide-timolol (COSOPT) 22.3-6.8 MG/ML ophthalmic solution Place 1 drop into both eyes 2 (two) times daily.   feeding supplement, ENSURE ENLIVE, (ENSURE ENLIVE) LIQD Take 237 mLs by mouth 3 (three) times daily between meals.   hydrALAZINE (APRESOLINE) 25 MG tablet Take 1 tablet (25 mg total) by mouth 3 (three) times daily.   latanoprost (XALATAN) 0.005 % ophthalmic solution Place 1 drop into both eyes at bedtime.   magnesium hydroxide (MILK OF MAGNESIA) 400 MG/5ML suspension If no BM in 3 days, give 30 cc Milk of Magnesium p.o. x 1 dose in 24 hours as needed (Do not use standing constipation orders for residents with renal failure CFR less than 30. Contact MD for orders)  metoprolol tartrate (LOPRESSOR) 25 MG tablet Take 0.5 tablets (12.5 mg total) by mouth 2 (two) times daily.   mirtazapine (REMERON) 15 MG tablet Take 15 mg by mouth at bedtime.   NON FORMULARY MECHANICAL SOFT NAS HH, THIN LIQUIDS   OXYGEN Inhale 2 L into the lungs. O2 at 2L/MIN VIA NASAL CANNULA CONTINUOUSLY   pantoprazole (PROTONIX) 40 MG tablet Take 1 tablet (40 mg total) by mouth daily.   RESTASIS 0.05 % ophthalmic emulsion Place 1 drop into both eyes 2 (two) times daily.   sennosides-docusate sodium  (SENOKOT-S) 8.6-50 MG tablet Take 2 tablets by mouth 2 (two) times daily as needed for constipation.   Sodium Phosphates (RA SALINE ENEMA RE) If not relieved by Biscodyl suppository, give disposable Saline Enema rectally X 1 dose/24 hrs as needed (Do not use constipation standing orders for residents with renal failure/CFR less than 30. Contact MD for orders)   traMADol (ULTRAM) 50 MG tablet Take 1 tablet (50 mg total) by mouth every 12 (twelve) hours as needed.   No facility-administered encounter medications on file as of 07/12/2021.    Review of Systems  GENERAL: No fever or chills  MOUTH and THROAT: Denies oral discomfort, gingival pain or bleeding RESPIRATORY: no cough, SOB, DOE, wheezing, hemoptysis CARDIAC: No chest pain, edema or palpitations GI: No abdominal pain, diarrhea, constipation, heart burn, nausea or vomiting GU: +urinary retention NEUROLOGICAL: Denies dizziness, syncope, numbness, or headache PSYCHIATRIC: Denies feelings of depression or anxiety. No report of hallucinations, insomnia, paranoia, or agitation    There is no immunization history on file for this patient. Pertinent  Health Maintenance Due  Topic Date Due   COLONOSCOPY (Pts 45-75yrs Insurance coverage will need to be confirmed)  Never done   PNA vac Low Risk Adult (1 of 2 - PCV13) Never done   INFLUENZA VACCINE  06/17/2021   No flowsheet data found.   Vitals:   07/12/21 1424  BP: 125/68  Pulse: 100  Resp: (!) 21  Temp: 97.7 F (36.5 C)  Weight: 97 lb 12.8 oz (44.4 kg)  Height: 5\' 10"  (1.778 m)   Body mass index is 14.03 kg/m.  Physical Exam  GENERAL APPEARANCE:  In no acute distress. Cachectic. SKIN:  Skin is warm and dry.  MOUTH and THROAT: Lips are without lesions. Oral mucosa is moist and without lesions.  RESPIRATORY: Breathing is even & unlabored, BS CTAB CARDIAC: RRR, no murmur,no extra heart sounds, no edema GI: Abdomen soft, normal BS, no masses, no tenderness NEUROLOGICAL:  There is no tremor. Speech is clear. Alert to self, disoriented to time and place. PSYCHIATRIC:  Affect and behavior are appropriate  Labs reviewed: Recent Labs    06/08/21 0510 06/09/21 0605 06/10/21 0457 06/13/21 0551 06/21/21 0000 07/11/21 0000  NA 132* 136 135 137 141 138  K 4.3 4.0 3.8 3.8 4.4 4.1  CL 99 97* 100 106 102 99  CO2 24 25 24 22  23* 26*  GLUCOSE 109* 124* 116* 105*  --   --   BUN 18 17 19 22 14  30*  CREATININE 1.59* 1.44* 1.35* 1.08 1.3 1.7*  CALCIUM 9.2 9.5 8.7* 9.1 9.7 9.6  MG 1.9  --   --   --   --   --   PHOS 2.7  --   --   --   --   --    Recent Labs    07/11/21 0000  AST 25  ALT 10  ALKPHOS 158*  ALBUMIN 3.0*  Recent Labs    06/04/21 1318 06/05/21 0415 06/10/21 0457 06/10/21 1535 06/11/21 0538 06/13/21 0551 06/21/21 0000 07/11/21 0000  WBC 8.7   < > 14.0*  --  12.0* 12.3* 10.0 14.7  NEUTROABS 5.1  --   --   --   --   --   --   --   HGB 11.7*   < > 8.7*   < > 8.5* 9.1* 8.6* 8.3*  HCT 38.4*   < > 27.7*   < > 27.1* 29.7* 26* 26*  MCV 98.2   < > 95.2  --  94.8 95.5  --   --   PLT 186   < > 346  --  362 586* 823* 380   < > = values in this interval not displayed.   Lab Results  Component Value Date   TSH 0.260 (L) 11/03/2016   No results found for: HGBA1C No results found for: CHOL, HDL, LDLCALC, LDLDIRECT, TRIG, CHOLHDL  Significant Diagnostic Results in last 30 days:  No results found.  Assessment/Plan  1. Urinary tract infection without hematuria, site unspecified -  wbc 14.7, has tea-colored urine -  will treat with Rocephin 1 gm IM daily X 7 days and Florastor 250 mg BID X 10 days  2. Primary hypertension -  BPs soft and HR up to 100, will discontinue hydralazine and increase metoprolol to tartrate from 12.5 mg twice a day to 25 mg twice a day -Monitor for BPs and HR  3. Urinary retention -  foley catheter was inserted  -  will need referral to urology  4.  Anemia -  hgb 8.3, will start FeSO4 325 mg BID and Vitamin C 500  mg BID    Family/ staff Communication:   Discussed plan of care with resident and charge nurse.  Labs/tests ordered:  repeat CBC with differentials on 07/16/21, KUB, UA with culture and sensitivity, chest x-ray  Goals of care:   Short-term care   Kenard Gower, DNP, MSN, FNP-BC [facility] [phone] (Monday-Friday 8:00 a.m. - 5:00 p.m.) [phone] (after hours)

## 2021-07-15 LAB — COMPREHENSIVE METABOLIC PANEL
Calcium: 9.6 (ref 8.7–10.7)
GFR calc Af Amer: >90
GFR calc non Af Amer: 78.61

## 2021-07-15 LAB — BASIC METABOLIC PANEL
BUN: 23 — AB (ref 4–21)
CO2: 22 (ref 13–22)
Chloride: 101 (ref 99–108)
Creatinine: 1 (ref 0.6–1.3)
Glucose: 61
Potassium: 4.7 (ref 3.4–5.3)
Sodium: 139 (ref 137–147)

## 2021-07-15 LAB — CBC: RBC: 3.18 — AB (ref 3.87–5.11)

## 2021-07-15 LAB — CBC AND DIFFERENTIAL
HCT: 28 — AB (ref 41–53)
Hemoglobin: 8.7 — AB (ref 13.5–17.5)
Platelets: 410 — AB (ref 150–399)
WBC: 7.9

## 2021-07-16 ENCOUNTER — Non-Acute Institutional Stay (SKILLED_NURSING_FACILITY): Payer: Medicare Other | Admitting: Adult Health

## 2021-07-16 ENCOUNTER — Encounter: Payer: Self-pay | Admitting: Adult Health

## 2021-07-16 DIAGNOSIS — N3000 Acute cystitis without hematuria: Secondary | ICD-10-CM

## 2021-07-16 DIAGNOSIS — D649 Anemia, unspecified: Secondary | ICD-10-CM

## 2021-07-16 DIAGNOSIS — K567 Ileus, unspecified: Secondary | ICD-10-CM

## 2021-07-16 NOTE — Progress Notes (Signed)
Location:  [facility] Room Number: 222-B Place of Service:  SNF (31) Provider:  Kenard Gower, DNP, FNP-BC  Patient Care Team: [facility]calls, [facility] Making as PCP - General (Geriatric Medicine)  Extended Emergency Contact Information Primary Emergency Contact: Copeland,Dorothy  [place] Home Phone: [phone] Relation: Sister  Code Status: DNR   Goals of care: Advanced Directive information Advanced Directives 08/01/2021  Does Patient Have a Medical Advance Directive? Yes  Type of Estate agent of [place];Out of facility DNR (pink MOST or yellow form)  Does patient want to make changes to medical advance directive? No - Patient declined  Copy of Healthcare Power of Attorney in Chart? Yes - validated most recent copy scanned in chart (See row information)  Would patient like information on creating a medical advance directive? -  Pre-existing out of facility DNR order (yellow form or pink MOST form) Yellow form placed in chart (order not valid for inpatient use)     Chief Complaint  Patient presents with   Acute Visit    Follow up Ileus     HPI:  Pt is a 70 y.o. male seen today for follow up ileus. He is currently having a short-term rehabilitation at [facility].  KUB done on 8/26 showed ileus and he was started on clear liquids. Repeat KUB showed no acute pathology. He denies having abdominal pain. Labs showed hgb 8.7, up from 8.3, 4 days ago. He takes FeSO4 325 mg BID for anemia. He has a foley catheter draining clear yellowish urine. He is currently on Ceftriaxone 1 gm IM daily for 7 days for UTI. No reported fever nor chills.   Past Medical History:  Diagnosis Date   Glaucoma    HOH (hard of hearing)    Hypertension    Past Surgical History:  Procedure Laterality Date   ARTHROTOMY Left 07/21/2014   Procedure: ARTHROTOMY; IRRIGATION AND DRAINAGE OF LEFT WRIST INFECTION;  Surgeon:  Eldred Manges, MD;  Location: [facility] OR;  Service: Orthopedics;  Laterality: Left;    Allergies  Allergen Reactions   Lactose Intolerance (Gi)     UNK reaction    Outpatient Encounter Medications as of 07/16/2021  Medication Sig   acetaminophen (TYLENOL) 325 MG tablet Take 650 mg by mouth every 6 (six) hours as needed.   albuterol (VENTOLIN HFA) 108 (90 Base) MCG/ACT inhaler Inhale 2 puffs into the lungs every 4 (four) hours as needed for wheezing or shortness of breath.   Ascorbic Acid (VITAMIN C) 500 MG CHEW Chew by mouth 2 (two) times daily.   bisacodyl (DULCOLAX) 10 MG suppository 10 mg. If not relieved by MOM, give 10 mg Bisacodyl suppositiory rectally X 1 dose in 24 hours as needed (Do not use constipation standing orders for residents with renal failure/CFR less than 30. Contact MD for orders)   brimonidine (ALPHAGAN) 0.2 % ophthalmic solution Place 1 drop into the left eye 3 (three) times daily.   [EXPIRED] cefTRIAXone (ROCEPHIN) 1 g injection Inject 1 g into the muscle daily.   dorzolamide-timolol (COSOPT) 22.3-6.8 MG/ML ophthalmic solution Place 1 drop into both eyes 2 (two) times daily.   feeding supplement, ENSURE ENLIVE, (ENSURE ENLIVE) LIQD Take 237 mLs by mouth 3 (three) times daily between meals.   ferrous sulfate 325 (65 FE) MG tablet Take 325 mg by mouth 2 (two) times daily.   latanoprost (XALATAN) 0.005 % ophthalmic solution Place 1 drop into both eyes at bedtime.   magnesium  hydroxide (MILK OF MAGNESIA) 400 MG/5ML suspension If no BM in 3 days, give 30 cc Milk of Magnesium p.o. x 1 dose in 24 hours as needed (Do not use standing constipation orders for residents with renal failure CFR less than 30. Contact MD for orders)   mirtazapine (REMERON) 15 MG tablet Take 15 mg by mouth at bedtime.   NON FORMULARY MECHANICAL SOFT NAS HH, THIN LIQUIDS   OXYGEN Inhale 2 L into the lungs. O2 at 2L/MIN VIA NASAL CANNULA CONTINUOUSLY   pantoprazole (PROTONIX) 40 MG tablet Take 1 tablet (40 mg  total) by mouth daily.   polyethylene glycol (MIRALAX / GLYCOLAX) 17 g packet Take 17 g by mouth daily.   RESTASIS 0.05 % ophthalmic emulsion Place 1 drop into both eyes 2 (two) times daily.   [EXPIRED] saccharomyces boulardii (FLORASTOR) 250 MG capsule Take 250 mg by mouth 2 (two) times daily.   sennosides-docusate sodium (SENOKOT-S) 8.6-50 MG tablet Take 2 tablets by mouth 2 (two) times daily as needed for constipation.   Sodium Phosphates (RA SALINE ENEMA RE) If not relieved by Biscodyl suppository, give disposable Saline Enema rectally X 1 dose/24 hrs as needed (Do not use constipation standing orders for residents with renal failure/CFR less than 30. Contact MD for orders)   [DISCONTINUED] apixaban (ELIQUIS) 5 MG TABS tablet Take 1 tablet (5 mg total) by mouth 2 (two) times daily.   [DISCONTINUED] metoprolol tartrate (LOPRESSOR) 25 MG tablet Take 0.5 tablets (12.5 mg total) by mouth 2 (two) times daily.   [DISCONTINUED] traMADol (ULTRAM) 50 MG tablet Take 1 tablet (50 mg total) by mouth every 12 (twelve) hours as needed.   [DISCONTINUED] hydrALAZINE (APRESOLINE) 25 MG tablet Take 1 tablet (25 mg total) by mouth 3 (three) times daily.   No facility-administered encounter medications on file as of 07/16/2021.    Review of Systems  GENERAL: No fever or chills  MOUTH and THROAT: Denies oral discomfort, gingival pain or bleeding RESPIRATORY: no cough, SOB, DOE, wheezing, hemoptysis CARDIAC: No chest pain, edema or palpitations GI: No abdominal pain, diarrhea, constipation, heart burn, nausea or vomiting GU: Denies discharge NEUROLOGICAL: Denies dizziness, syncope, numbness, or headache PSYCHIATRIC: Denies feelings of depression or anxiety. No report of hallucinations, insomnia, paranoia, or agitation    Immunization History  Administered Date(s) Administered   DTaP 07/18/2017   Influenza-Unspecified 08/17/2020   Moderna Sars-Covid-2 Vaccination 11/30/2019, 12/28/2019, 09/20/2020    Pneumococcal-Unspecified 08/17/2020   Pertinent  Health Maintenance Due  Topic Date Due   COLONOSCOPY (Pts 45-27yrs Insurance coverage will need to be confirmed)  Never done   INFLUENZA VACCINE  06/17/2021   PNA vac Low Risk Adult (2 of 2 - PCV13) 08/17/2021   No flowsheet data found.   Vitals:   07/16/21 1553  BP: 128/74  Pulse: 84  Resp: 20  Temp: (!) 97.5 F (36.4 C)  Weight: 97 lb 12.8 oz (44.4 kg)  Height: 5\' 10"  (1.778 m)   Body mass index is 14.03 kg/m.  Physical Exam  GENERAL APPEARANCE:  In no acute distress.  SKIN:  Skin is warm and dry.  MOUTH and THROAT: Lips are without lesions. Oral mucosa is moist and without lesions.  RESPIRATORY: Breathing is even & unlabored, BS CTAB CARDIAC: RRR, no murmur,no extra heart sounds, no edema GI: Abdomen soft, normal BS, no masses, no tenderness NEUROLOGICAL: There is no tremor. Speech is clear. Alert to self, disoriented to time and place. PSYCHIATRIC:  Affect and behavior are appropriate  Labs reviewed:  Recent Labs    06/08/21 0510 06/09/21 0605 06/10/21 0457 06/13/21 0551 06/21/21 0000 07/11/21 0000 07/15/21 0000  NA 132* 136 135 137 141 138 139  K 4.3 4.0 3.8 3.8 4.4 4.1 4.7  CL 99 97* 100 106 102 99 101  CO2 24 25 24 22  23* 26* 22  GLUCOSE 109* 124* 116* 105*  --   --   --   BUN 18 17 19 22 14  30* 23*  CREATININE 1.59* 1.44* 1.35* 1.08 1.3 1.7* 1.0  CALCIUM 9.2 9.5 8.7* 9.1 9.7 9.6 9.6  MG 1.9  --   --   --   --   --   --   PHOS 2.7  --   --   --   --   --   --    Recent Labs    07/11/21 0000  AST 25  ALT 10  ALKPHOS 158*  ALBUMIN 3.0*   Recent Labs    06/04/21 1318 06/05/21 0415 06/10/21 0457 06/10/21 1535 06/11/21 0538 06/13/21 0551 06/21/21 0000 07/11/21 0000 07/15/21 0000 07/23/21 0000  WBC 8.7   < > 14.0*  --  12.0* 12.3*   < > 14.7 7.9 4.4  NEUTROABS 5.1  --   --   --   --   --   --   --   --   --   HGB 11.7*   < > 8.7*   < > 8.5* 9.1*   < > 8.3* 8.7* 8.0*  HCT 38.4*   < > 27.7*    < > 27.1* 29.7*   < > 26* 28* 26*  MCV 98.2   < > 95.2  --  94.8 95.5  --   --   --   --   PLT 186   < > 346  --  362 586*   < > 380 410* 666*   < > = values in this interval not displayed.   Lab Results  Component Value Date   TSH 0.260 (L) 11/03/2016    Assessment/Plan  1. Ileus (HCC) -  repeat KUB was negative for pathology -  will start on mechanical soft diet with thin liquids  2. Acute cystitis without hematuria -  continue Rocephin for a total of 7 days -  continue foley catheter and referral to urology for urinary retention  3. Anemia, unspecified type -  hgb 8.7, up from 8.3 four days ago -  continue FeSO4 325 mg BID      Family/ staff Communication:   Discussed plan of care with resident and charge nurse.  Labs/tests ordered:  None  Goals of care:   Short-term care   09/22/21, DNP, MSN, FNP-BC [facility] [phone] (Monday-Friday 8:00 a.m. - 5:00 p.m.) [phone] (after hours)

## 2021-07-17 LAB — COMPREHENSIVE METABOLIC PANEL
Calcium: 8.7 (ref 8.7–10.7)
GFR calc Af Amer: >90
GFR calc non Af Amer: >90

## 2021-07-17 LAB — CBC AND DIFFERENTIAL
HCT: 24 — AB (ref 41–53)
Hemoglobin: 7.3 — AB (ref 13.5–17.5)
Platelets: 449 — AB (ref 150–399)
WBC: 5.3

## 2021-07-17 LAB — BASIC METABOLIC PANEL
BUN: 13 (ref 4–21)
CO2: 27 — AB (ref 13–22)
Chloride: 102 (ref 99–108)
Creatinine: 0.8 (ref 0.6–1.3)
Glucose: 102
Potassium: 4.2 (ref 3.4–5.3)
Sodium: 139 (ref 137–147)

## 2021-07-17 LAB — CBC: RBC: 2.71 — AB (ref 3.87–5.11)

## 2021-07-20 ENCOUNTER — Other Ambulatory Visit: Payer: Self-pay | Admitting: Adult Health

## 2021-07-20 MED ORDER — TRAMADOL HCL 50 MG PO TABS: 50.0000 mg | ORAL_TABLET | Freq: Two times a day (BID) | ORAL | 0 refills | Status: DC | PRN

## 2021-07-23 LAB — CBC AND DIFFERENTIAL
HCT: 26 — AB (ref 41–53)
Hemoglobin: 8 — AB (ref 13.5–17.5)
Platelets: 666 — AB (ref 150–399)
WBC: 4.4

## 2021-07-23 LAB — CBC: RBC: 2.88 — AB (ref 3.87–5.11)

## 2021-08-01 ENCOUNTER — Non-Acute Institutional Stay (SKILLED_NURSING_FACILITY): Payer: Medicare Other | Admitting: Adult Health

## 2021-08-01 ENCOUNTER — Encounter: Payer: Self-pay | Admitting: Adult Health

## 2021-08-01 DIAGNOSIS — L89622 Pressure ulcer of left heel, stage 2: Secondary | ICD-10-CM

## 2021-08-01 DIAGNOSIS — D649 Anemia, unspecified: Secondary | ICD-10-CM

## 2021-08-01 DIAGNOSIS — I82402 Acute embolism and thrombosis of unspecified deep veins of left lower extremity: Secondary | ICD-10-CM | POA: Diagnosis not present

## 2021-08-01 DIAGNOSIS — I2699 Other pulmonary embolism without acute cor pulmonale: Secondary | ICD-10-CM | POA: Diagnosis not present

## 2021-08-01 DIAGNOSIS — Z515 Encounter for palliative care: Secondary | ICD-10-CM

## 2021-08-01 NOTE — Progress Notes (Signed)
Location:  [facility] Room Number: 222-B Place of Service:  SNF (31) Provider:  Kenard Gower, DNP, FNP-BC  Patient Care Team: Housecalls, Doctors Making as PCP - General (Geriatric Medicine)  Extended Emergency Contact Information Primary Emergency Contact: Copeland,Dorothy  [place] Home Phone: [phone] Relation: Sister  Code Status:  DNR  Goals of care: Advanced Directive information Advanced Directives 08/01/2021  Does Patient Have a Medical Advance Directive? Yes  Type of Estate agent of [place];Out of facility DNR (pink MOST or yellow form)  Does patient want to make changes to medical advance directive? No - Patient declined  Copy of Healthcare Power of Attorney in Chart? Yes - validated most recent copy scanned in chart (See row information)  Would patient like information on creating a medical advance directive? -  Pre-existing out of facility DNR order (yellow form or pink MOST form) Yellow form placed in chart (order not valid for inpatient use)     Chief Complaint  Patient presents with   Transitions Of Care    Transition to hospice    HPI:  Pt is a 70 y.o. [place] seen today for transition of care. Patient is a long-term care resident of [facility]. He has chronic urinary retention and has indwelling foley catheter. Patient was seen in the room today. He is severely hard of hearing. No noted SOB today. He stated that he does not want to take medications. He was noted to have open wound on his left heel.  He takes Eliquis 2.5 mg twice a day for left lower extremity DVT.  No noted SOB.  He was on room air.  He takes ferrous sulfate 325 mg twice a day for anemia.  Latest hemoglobin 8.0, 07/23/22.   Past Medical History:  Diagnosis Date   Glaucoma    HOH (hard of hearing)    Hypertension    Past Surgical History:  Procedure Laterality Date   ARTHROTOMY Left 07/21/2014    Procedure: ARTHROTOMY; IRRIGATION AND DRAINAGE OF LEFT WRIST INFECTION;  Surgeon: Eldred Manges, MD;  Location: [facility] OR;  Service: Orthopedics;  Laterality: Left;    Allergies  Allergen Reactions   Lactose Intolerance (Gi)     UNK reaction    Outpatient Encounter Medications as of 08/01/2021  Medication Sig   acetaminophen (TYLENOL) 325 MG tablet Take 650 mg by mouth every 6 (six) hours as needed.   albuterol (VENTOLIN HFA) 108 (90 Base) MCG/ACT inhaler Inhale 2 puffs into the lungs every 4 (four) hours as needed for wheezing or shortness of breath.   apixaban (ELIQUIS) 2.5 MG TABS tablet Take 2.5 mg by mouth 2 (two) times daily.   Ascorbic Acid (VITAMIN C) 500 MG CHEW Chew by mouth 2 (two) times daily.   bisacodyl (DULCOLAX) 10 MG suppository 10 mg. If not relieved by MOM, give 10 mg Bisacodyl suppositiory rectally X 1 dose in 24 hours as needed (Do not use constipation standing orders for residents with renal failure/CFR less than 30. Contact MD for orders)   brimonidine (ALPHAGAN) 0.2 % ophthalmic solution Place 1 drop into the left eye 3 (three) times daily.   dorzolamide-timolol (COSOPT) 22.3-6.8 MG/ML ophthalmic solution Place 1 drop into both eyes 2 (two) times daily.   feeding supplement, ENSURE ENLIVE, (ENSURE ENLIVE) LIQD Take 237 mLs by mouth 3 (three) times daily between meals.   ferrous sulfate 325 (65 FE) MG tablet Take 325 mg by mouth 2 (two) times daily.  latanoprost (XALATAN) 0.005 % ophthalmic solution Place 1 drop into both eyes at bedtime.   magnesium hydroxide (MILK OF MAGNESIA) 400 MG/5ML suspension If no BM in 3 days, give 30 cc Milk of Magnesium p.o. x 1 dose in 24 hours as needed (Do not use standing constipation orders for residents with renal failure CFR less than 30. Contact MD for orders)   metoprolol tartrate (LOPRESSOR) 25 MG tablet Take 25 mg by mouth 2 (two) times daily.   mirtazapine (REMERON) 15 MG tablet Take 15 mg by mouth at bedtime.   NON FORMULARY  MECHANICAL SOFT NAS HH, THIN LIQUIDS   OXYGEN Inhale 2 L into the lungs. O2 at 2L/MIN VIA NASAL CANNULA CONTINUOUSLY   pantoprazole (PROTONIX) 40 MG tablet Take 1 tablet (40 mg total) by mouth daily.   polyethylene glycol (MIRALAX / GLYCOLAX) 17 g packet Take 17 g by mouth daily.   RESTASIS 0.05 % ophthalmic emulsion Place 1 drop into both eyes 2 (two) times daily.   sennosides-docusate sodium (SENOKOT-S) 8.6-50 MG tablet Take 2 tablets by mouth 2 (two) times daily as needed for constipation.   Sodium Phosphates (RA SALINE ENEMA RE) If not relieved by Biscodyl suppository, give disposable Saline Enema rectally X 1 dose/24 hrs as needed (Do not use constipation standing orders for residents with renal failure/CFR less than 30. Contact MD for orders)   tamsulosin (FLOMAX) 0.4 MG CAPS capsule Take 0.4 mg by mouth at bedtime.   UNABLE TO FIND Med Name:Magic cup once daily to promote weight gain   [DISCONTINUED] traMADol (ULTRAM) 50 MG tablet Take 1 tablet (50 mg total) by mouth every 12 (twelve) hours as needed.   [DISCONTINUED] apixaban (ELIQUIS) 5 MG TABS tablet Take 1 tablet (5 mg total) by mouth 2 (two) times daily.   [DISCONTINUED] metoprolol tartrate (LOPRESSOR) 25 MG tablet Take 0.5 tablets (12.5 mg total) by mouth 2 (two) times daily.   No facility-administered encounter medications on file as of 08/01/2021.    Review of Systems unable to obtain due to severely hard of hearing    Immunization History  Administered Date(s) Administered   DTaP 07/18/2017   Influenza-Unspecified 08/17/2020   Moderna Sars-Covid-2 Vaccination 11/30/2019, 12/28/2019, 09/20/2020   Pneumococcal-Unspecified 08/17/2020   Pertinent  Health Maintenance Due  Topic Date Due   COLONOSCOPY (Pts 45-40yrs Insurance coverage will need to be confirmed)  Never done   INFLUENZA VACCINE  06/17/2021   No flowsheet data found.   Vitals:   08/01/21 1136  BP: (!) 116/55  Pulse: 75  Resp: (!) 21  Temp: (!) 97.5 F  (36.4 C)  SpO2: 94%  Weight: 94 lb (42.6 kg)  Height: 5\' 10"  (1.778 m)   Body mass index is 13.49 kg/m.  Physical Exam  GENERAL APPEARANCE:  In no acute distress.  Cachectic. SKIN: Left heel open wound, dry. MOUTH and THROAT: Lips are without lesions. Oral mucosa is moist and without lesions.  RESPIRATORY: Breathing is even & unlabored, BS CTAB CARDIAC: RRR, no murmur,no extra heart sounds, no edema GI: Abdomen soft, normal BS, no masses, no tenderness NEUROLOGICAL: There is no tremor. Speech is clear.  Alert to self, disoriented to time and place PSYCHIATRIC:. Affect and behavior are appropriate  Labs reviewed: Recent Labs    06/08/21 0510 06/09/21 0605 06/10/21 0457 06/13/21 0551 06/21/21 0000 07/11/21 0000 08/[place]/22 0000  NA 132* 136 135 137 141 138 139  K 4.3 4.0 3.8 3.8 4.4 4.1 4.7  CL 99 97* 100 106 102  99 101  CO2 24 25 24 22  23* 26* 22  GLUCOSE 109* 124* 116* 105*  --   --   --   BUN 18 17 19 22 14  30* 23*  CREATININE 1.59* 1.44* 1.35* 1.08 1.3 1.7* 1.0  CALCIUM 9.2 9.5 8.7* 9.1 9.7 9.6 9.6  MG 1.9  --   --   --   --   --   --   PHOS 2.7  --   --   --   --   --   --    Recent Labs    07/11/21 0000  AST 25  ALT 10  ALKPHOS 158*  ALBUMIN 3.0*   Recent Labs    06/04/21 1318 06/05/21 0415 06/10/21 0457 06/10/21 1535 06/11/21 0538 06/13/21 0551 06/21/21 0000 07/11/21 0000 08/[place]/22 0000 07/23/21 0000  WBC 8.7   < > 14.0*  --  12.0* 12.3*   < > 14.7 7.9 4.4  NEUTROABS 5.1  --   --   --   --   --   --   --   --   --   HGB 11.7*   < > 8.7*   < > 8.5* 9.1*   < > 8.3* 8.7* 8.0*  HCT 38.4*   < > 27.7*   < > 27.1* Edwin.7*   < > 26* 28* 26*  MCV 98.2   < > 95.2  --  94.8 95.5  --   --   --   --   PLT 186   < > 346  --  362 586*   < > 380 410* 666*   < > = values in this interval not displayed.   Lab Results  Component Value Date   TSH 0.260 (L) 11/03/2016   No results found for: HGBA1C No results found for: CHOL, HDL, LDLCALC, LDLDIRECT, TRIG,  CHOLHDL  Significant Diagnostic Results in last 30 days:  No results found.  Assessment/Plan  1. Hospice care -   family requested for hospice care/comfort care  2. Acute deep vein thrombosis (DVT) of left lower extremity, unspecified vein (HCC) -   Continue Eliquis  3. Bilateral pulmonary embolism (HCC) -   Continue Eliquis -  no SOB, will change O2 to PRN  4. Pressure ulcer of left heel, stage 2 (HCC) -   We will start wound treatment with Xeroform dressing daily -   Heel protectors  5. Anemia, unspecified type Lab Results  Component Value Date   HGB 8.0 (A) 07/23/2021   -   Continue ferrous sulfate     Family/ staff Communication: Discussed plan of care with charge nurse.  Labs/tests ordered:   None  Goals of care:   Long-term care/hospice care   11/05/2016, DNP, MSN, FNP-BC [facility] [phone] (Monday-Friday 8:00 a.m. - 5:00 p.m.) [phone] (after hours)

## 2021-08-09 ENCOUNTER — Other Ambulatory Visit: Payer: Self-pay | Admitting: Adult Health

## 2021-08-09 MED ORDER — TRAMADOL HCL 50 MG PO TABS: 50.0000 mg | ORAL_TABLET | Freq: Two times a day (BID) | ORAL | 0 refills | Status: DC | PRN

## 2021-08-11 IMAGING — DX DG KNEE 1-2V*L*
2 series · 2 of 2 positions shown · non-contrast
Comparison: None.

CLINICAL DATA: Left hip, knee, and ankle pain.

EXAM:
LEFT KNEE - 1-2 VIEW

[knee ap]
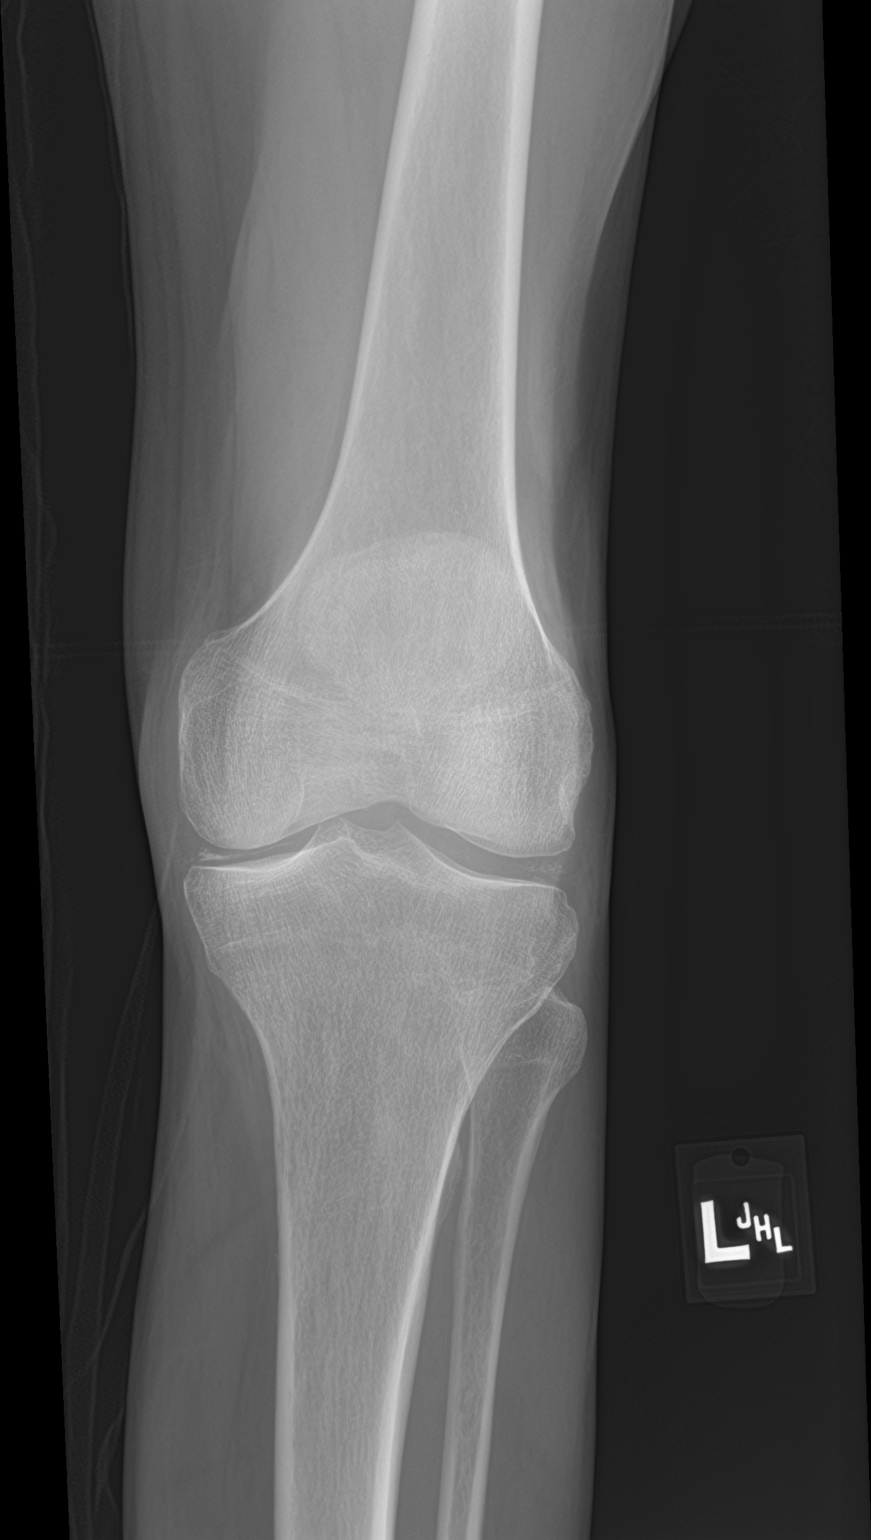

[knee lat]
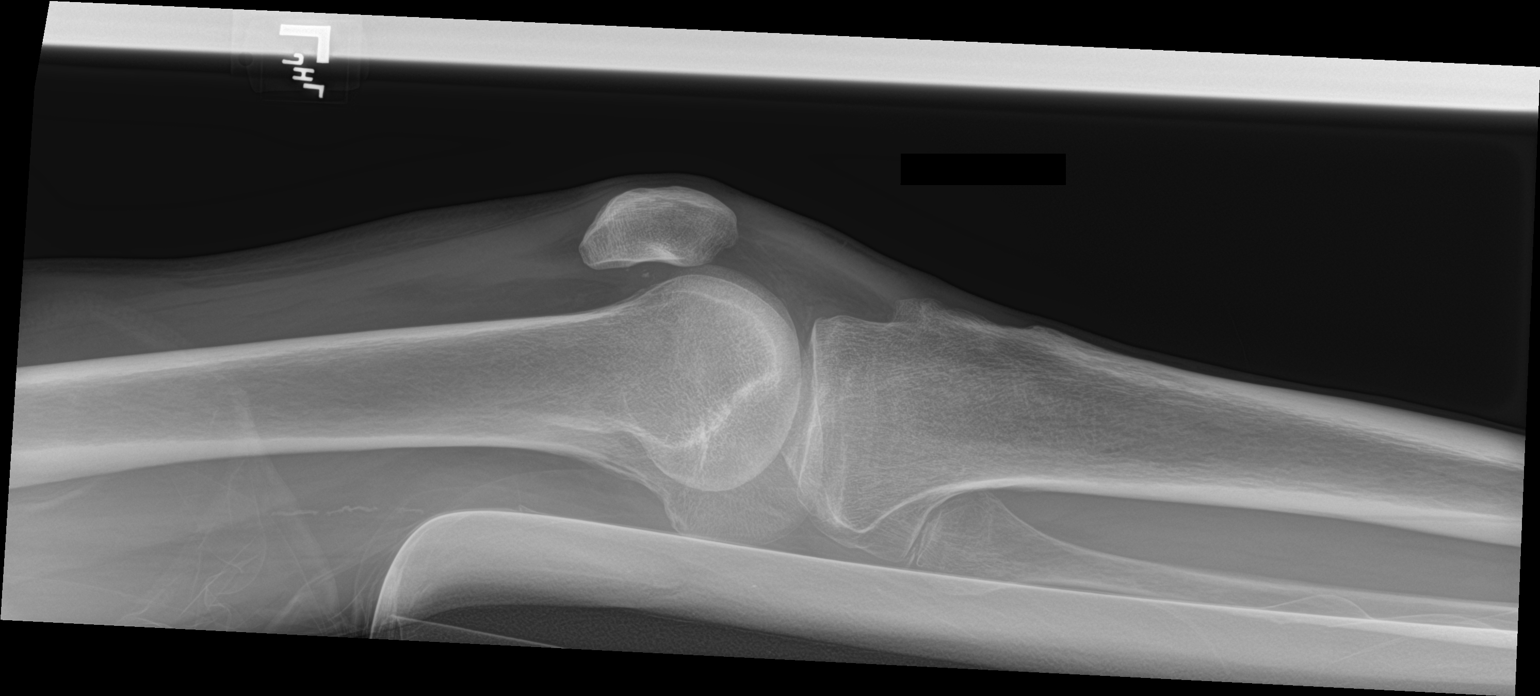

[2 of 2 positions shown; findings below may reference images not displayed]

FINDINGS: No fracture. Normal alignment. Minimal medial tibiofemoral joint
space narrowing. There is medial and lateral tibiofemoral
chondrocalcinosis. Trace joint effusion. No erosion, bone
destruction or focal bone abnormality. The bones are subjectively
under mineralized.
IMPRESSION: Mild medial tibiofemoral joint space narrowing and
chondrocalcinosis. Trace joint effusion. Findings may represent CPPD
arthropathy. No erosive change.

## 2021-08-21 ENCOUNTER — Encounter: Payer: Self-pay | Admitting: Adult Health

## 2021-08-21 ENCOUNTER — Non-Acute Institutional Stay (SKILLED_NURSING_FACILITY): Payer: Medicare Other | Admitting: Adult Health

## 2021-08-21 DIAGNOSIS — I2699 Other pulmonary embolism without acute cor pulmonale: Secondary | ICD-10-CM

## 2021-08-21 DIAGNOSIS — R627 Adult failure to thrive: Secondary | ICD-10-CM

## 2021-08-21 DIAGNOSIS — I10 Essential (primary) hypertension: Secondary | ICD-10-CM | POA: Diagnosis not present

## 2021-08-21 DIAGNOSIS — I82502 Chronic embolism and thrombosis of unspecified deep veins of left lower extremity: Secondary | ICD-10-CM | POA: Diagnosis not present

## 2021-08-21 DIAGNOSIS — Z7189 Other specified counseling: Secondary | ICD-10-CM

## 2021-08-21 NOTE — Progress Notes (Signed)
Location:  [facility] Room Number: 222-B Place of Service:  SNF (31) Provider:  Kenard Gower, DNP, FNP-BC  Patient Care Team: [facility], [facility] as PCP - General (Geriatric Medicine)  Extended Emergency Contact Information Primary Emergency Contact: Copeland,Dorothy  [place] Home Phone: [phone] Relation: Sister  Code Status:  DNR  Goals of care: Advanced Directive information Advanced Directives 08/21/2021  Does Patient Have a Medical Advance Directive? Yes  Type of Estate agent of [place];Living will;Out of facility DNR (pink MOST or yellow form)  Does patient want to make changes to medical advance directive? No - Patient declined  Copy of Healthcare Power of Attorney in Chart? Yes - validated most recent copy scanned in chart (See row information)  Would patient like information on creating a medical advance directive? -  Pre-existing out of facility DNR order (yellow form or pink MOST form) Yellow form placed in chart (order not valid for inpatient use)     Chief Complaint  Patient presents with   Advanced Directive    Advance care planning- Care plan meeting     HPI:  Pt is a 70 y.o. male seen today for a care plan meeting which was attended by his sister, Edwin Wright,  Child psychotherapist and MDS coordinator.  He remains to be DNR.  He is currently followed by hospice care.  Discussed medications, vital signs and weights. SBPs ranging from 106-127, with outlier 96.  He takes metoprolol tartrate 25 mg twice a day for hypertension.  No reported SOB. He takes Eliquis 2.5 mg twice a day for pulmonary embolism and left lower extremity DVT.  Weight is stable at 96 lbs. he takes Remeron 15 mg at bedtime to increase appetite.  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  The meeting lasted for 20 minutes.   Past Medical History:  Diagnosis Date   Glaucoma    HOH  (hard of hearing)    Hypertension    Past Surgical History:  Procedure Laterality Date   ARTHROTOMY Left 07/21/2014   Procedure: ARTHROTOMY; IRRIGATION AND DRAINAGE OF LEFT WRIST INFECTION;  Surgeon: Eldred Manges, MD;  Location: [facility] OR;  Service: Orthopedics;  Laterality: Left;    Allergies  Allergen Reactions   Lactose Intolerance (Gi)     UNK reaction    Outpatient Encounter Medications as of 08/21/2021  Medication Sig   acetaminophen (TYLENOL) 325 MG tablet Take 650 mg by mouth every 6 (six) hours as needed.   albuterol (VENTOLIN HFA) 108 (90 Base) MCG/ACT inhaler Inhale 2 puffs into the lungs every 4 (four) hours as needed for wheezing or shortness of breath.   apixaban (ELIQUIS) 2.5 MG TABS tablet Take 2.5 mg by mouth 2 (two) times daily.   Ascorbic Acid (VITAMIN C) 500 MG CHEW Chew by mouth 2 (two) times daily.   bisacodyl (DULCOLAX) 10 MG suppository 10 mg. If not relieved by MOM, give 10 mg Bisacodyl suppositiory rectally X 1 dose in 24 hours as needed (Do not use constipation standing orders for residents with renal failure/CFR less than 30. Contact MD for orders)   brimonidine (ALPHAGAN) 0.2 % ophthalmic solution Place 1 drop into the left eye 3 (three) times daily.   dorzolamide-timolol (COSOPT) 22.3-6.8 MG/ML ophthalmic solution Place 1 drop into both eyes 2 (two) times daily.   feeding supplement, ENSURE ENLIVE, (ENSURE ENLIVE) LIQD Take 237 mLs by mouth 3 (three) times daily between  meals.   ferrous sulfate 325 (65 FE) MG tablet Take 325 mg by mouth 2 (two) times daily.   latanoprost (XALATAN) 0.005 % ophthalmic solution Place 1 drop into both eyes at bedtime.   magnesium hydroxide (MILK OF MAGNESIA) 400 MG/5ML suspension If no BM in 3 days, give 30 cc Milk of Magnesium p.o. x 1 dose in 24 hours as needed (Do not use standing constipation orders for residents with renal failure CFR less than 30. Contact MD for orders)   metoprolol tartrate (LOPRESSOR) 25 MG tablet Take 25 mg by  mouth 2 (two) times daily.   mirtazapine (REMERON) 15 MG tablet Take 15 mg by mouth at bedtime.   NON FORMULARY MECHANICAL SOFT, THIN LIQUIDS   pantoprazole (PROTONIX) 40 MG tablet Take 1 tablet (40 mg total) by mouth daily.   polyethylene glycol (MIRALAX / GLYCOLAX) 17 g packet Take 17 g by mouth daily.   RESTASIS 0.05 % ophthalmic emulsion Place 1 drop into both eyes 2 (two) times daily.   sennosides-docusate sodium (SENOKOT-S) 8.6-50 MG tablet Take 2 tablets by mouth 2 (two) times daily as needed for constipation.   Sodium Phosphates (RA SALINE ENEMA RE) If not relieved by Biscodyl suppository, give disposable Saline Enema rectally X 1 dose/24 hrs as needed (Do not use constipation standing orders for residents with renal failure/CFR less than 30. Contact MD for orders)   tamsulosin (FLOMAX) 0.4 MG CAPS capsule Take 0.4 mg by mouth at bedtime.   traMADol (ULTRAM) 50 MG tablet Take 1 tablet (50 mg total) by mouth every 12 (twelve) hours as needed.   UNABLE TO FIND Med Name:Magic cup once daily to promote weight gain   OXYGEN Inhale 2 L into the lungs. O2 at 2L/MIN VIA NASAL CANNULA CONTINUOUSLY   No facility-administered encounter medications on file as of 08/21/2021.    Review of Systems  Limited due to severely hard of hearing.  GENERAL: No change in appetite, no fatigue, no weight changes, no fever or chills  MOUTH and THROAT: Denies oral discomfort, gingival pain or bleeding RESPIRATORY: no cough, SOB, DOE, wheezing, hemoptysis CARDIAC: No chest pain, edema or palpitations GI: No abdominal pain, diarrhea, constipation, heart burn, nausea or vomiting GU: Denies dysuria, frequency, hematuria or discharge NEUROLOGICAL: Denies dizziness, syncope, numbness, or headache PSYCHIATRIC: Denies feelings of depression or anxiety. No report of hallucinations, insomnia, paranoia, or agitation   Immunization History  Administered Date(s) Administered   DTaP 07/18/2017   Influenza-Unspecified  08/17/2020   Moderna Sars-Covid-2 Vaccination 11/30/2019, 12/28/2019, 09/20/2020   Pneumococcal-Unspecified 08/17/2020   Pertinent  Health Maintenance Due  Topic Date Due   COLONOSCOPY (Pts 45-61yrs Insurance coverage will need to be confirmed)  Never done   INFLUENZA VACCINE  06/17/2021   No flowsheet data found.   Vitals:   08/21/21 1137  BP: (!) 119/54  Pulse: 62  Resp: 20  Temp: (!) 97.5 F (36.4 C)  SpO2: 98%  Weight: 96 lb (43.5 kg)  Height: 5\' 10"  (1.778 m)   Body mass index is 13.77 kg/m.  Physical Exam  GENERAL APPEARANCE:  In no acute distress.  Cachectic  SKIN:  Skin is warm and dry.  EARS: Pinnae are normal. Hard of hearing MOUTH and THROAT: Lips are without lesions. Oral mucosa is moist and without lesions.  RESPIRATORY: Breathing is even & unlabored, BS CTAB CARDIAC: RRR, no murmur,no extra heart sounds, no edema GI: Abdomen soft, normal BS, no masses, no tenderness NEUROLOGICAL: There is no tremor. Speech is  clear. PSYCHIATRIC:  Affect and behavior are appropriate  Labs reviewed: Recent Labs    06/08/21 0510 06/09/21 0605 06/10/21 0457 06/13/21 0551 06/21/21 0000 07/11/21 0000 07/15/21 0000  NA 132* 136 135 137 141 138 139  K 4.3 4.0 3.8 3.8 4.4 4.1 4.7  CL 99 97* 100 106 102 99 101  CO2 24 25 24 22  23* 26* 22  GLUCOSE 109* 124* 116* 105*  --   --   --   BUN 18 17 19 22 14  30* 23*  CREATININE 1.59* 1.44* 1.35* 1.08 1.3 1.7* 1.0  CALCIUM 9.2 9.5 8.7* 9.1 9.7 9.6 9.6  MG 1.9  --   --   --   --   --   --   PHOS 2.7  --   --   --   --   --   --    Recent Labs    07/11/21 0000  AST 25  ALT 10  ALKPHOS 158*  ALBUMIN 3.0*   Recent Labs    06/04/21 1318 06/05/21 0415 06/10/21 0457 06/10/21 1535 06/11/21 0538 06/13/21 0551 06/21/21 0000 07/11/21 0000 07/15/21 0000 07/23/21 0000  WBC 8.7   < > 14.0*  --  12.0* 12.3*   < > 14.7 7.9 4.4  NEUTROABS 5.1  --   --   --   --   --   --   --   --   --   HGB 11.7*   < > 8.7*   < > 8.5* 9.1*    < > 8.3* 8.7* 8.0*  HCT 38.4*   < > 27.7*   < > 27.1* 29.7*   < > 26* 28* 26*  MCV 98.2   < > 95.2  --  94.8 95.5  --   --   --   --   PLT 186   < > 346  --  362 586*   < > 380 410* 666*   < > = values in this interval not displayed.   Lab Results  Component Value Date   TSH 0.260 (L) 11/03/2016   No results found for: HGBA1C No results found for: CHOL, HDL, LDLCALC, LDLDIRECT, TRIG, CHOLHDL  Significant Diagnostic Results in last 30 days:  No results found.  Assessment/Plan  1. ACP (advance care planning) -   Remains to be DNR -   Discussed medications, vital signs and weights  2. Primary hypertension -  BPs soft, will decrease Metoprolol tartrate from 25 mg twice a day to 12.5 mg twice a day -    Monitor BPs  3. Bilateral pulmonary embolism (HCC) Chronic deep vein thrombosis (DVT) of left lower extremity, unspecified vein (HCC) -  no SOB, continue Eliquis  5. Failure to thrive in adult Filed Weights   08/21/21 1137  Weight: 96 lb (43.5 kg)   Body mass index is 13.77 kg/m. -   Continue Remeron and supplementations     Family/ staff Communication:   Discussed plan of care with sister and IDT.  Labs/tests ordered:   None  Goals of care:   Long-term care/hospice care   11/05/2016, DNP, MSN, FNP-BC [facility] [phone] (Monday-Friday 8:00 a.m. - 5:00 p.m.) [phone] (after hours)

## 2021-08-25 ENCOUNTER — Other Ambulatory Visit: Payer: Self-pay | Admitting: Adult Health

## 2021-08-25 MED ORDER — TRAMADOL HCL 50 MG PO TABS: 50.0000 mg | ORAL_TABLET | Freq: Two times a day (BID) | ORAL | 0 refills | Status: DC

## 2021-08-25 MED ORDER — TRAMADOL HCL 50 MG PO TABS: 50.0000 mg | ORAL_TABLET | Freq: Two times a day (BID) | ORAL | 0 refills | Status: DC | PRN

## 2021-08-27 ENCOUNTER — Non-Acute Institutional Stay (SKILLED_NURSING_FACILITY): Payer: Medicare Other | Admitting: Adult Health

## 2021-08-27 ENCOUNTER — Encounter: Payer: Self-pay | Admitting: Adult Health

## 2021-08-27 DIAGNOSIS — R627 Adult failure to thrive: Secondary | ICD-10-CM

## 2021-08-27 DIAGNOSIS — K219 Gastro-esophageal reflux disease without esophagitis: Secondary | ICD-10-CM

## 2021-08-27 DIAGNOSIS — I2699 Other pulmonary embolism without acute cor pulmonale: Secondary | ICD-10-CM | POA: Diagnosis not present

## 2021-08-27 DIAGNOSIS — I82502 Chronic embolism and thrombosis of unspecified deep veins of left lower extremity: Secondary | ICD-10-CM

## 2021-08-27 DIAGNOSIS — R630 Anorexia: Secondary | ICD-10-CM

## 2021-08-27 DIAGNOSIS — R29818 Other symptoms and signs involving the nervous system: Secondary | ICD-10-CM

## 2021-08-27 DIAGNOSIS — R4189 Other symptoms and signs involving cognitive functions and awareness: Secondary | ICD-10-CM

## 2021-08-27 DIAGNOSIS — R339 Retention of urine, unspecified: Secondary | ICD-10-CM

## 2021-08-27 NOTE — Progress Notes (Signed)
Location:  [facility] Room Number: 222-B Place of Service:  SNF (31) Provider:  Kenard Gower, DNP, FNP-BC  Patient Care Team: [facility], [facility] as PCP - General (Geriatric Medicine)  Extended Emergency Contact Information Primary Emergency Contact: Copeland,Dorothy  [place] Home Phone: [phone] Relation: Sister  Code Status:  DNR  Goals of care: Advanced Directive information Advanced Directives 08/27/2021  Does Patient Have a Medical Advance Directive? Yes  Type of Estate agent of [place];Living will;Out of facility DNR (pink MOST or yellow form)  Does patient want to make changes to medical advance directive? No - Patient declined  Copy of Healthcare Power of Attorney in Chart? Yes - validated most recent copy scanned in chart (See row information)  Would patient like information on creating a medical advance directive? -  Pre-existing out of facility DNR order (yellow form or pink MOST form) -     Chief Complaint  Patient presents with   Medical Management of Chronic Issues    Routine Visit.     HPI:  Pt is a 70 y.o. male seen today for medical management of chronic diseases. He is a long-term care resident of [facility] and being followed by hospice care. He has a PMH of hypertension and hard of hearing. He has a chronic foley catheter due to urinary retention. He is being followed by hospice  due to failure to thrive. Latest weight is 96 lbs Body mass index is 13.77 kg/m. He takes Remeron 15 mg for poor appetite. No SOB. He takes Eliquis 2.5 mg BID for DVT.   Past Medical History:  Diagnosis Date   Glaucoma    HOH (hard of hearing)    Hypertension    Past Surgical History:  Procedure Laterality Date   ARTHROTOMY Left 07/21/2014   Procedure: ARTHROTOMY; IRRIGATION AND DRAINAGE OF LEFT WRIST INFECTION;  Surgeon: Eldred Manges, MD;  Location: [facility] OR;  Service:  Orthopedics;  Laterality: Left;    Allergies  Allergen Reactions   Lactose Intolerance (Gi)     UNK reaction    Outpatient Encounter Medications as of 08/27/2021  Medication Sig   acetaminophen (TYLENOL) 325 MG tablet Take 650 mg by mouth every 6 (six) hours as needed.   albuterol (VENTOLIN HFA) 108 (90 Base) MCG/ACT inhaler Inhale 2 puffs into the lungs every 4 (four) hours as needed for wheezing or shortness of breath.   apixaban (ELIQUIS) 2.5 MG TABS tablet Take 2.5 mg by mouth 2 (two) times daily.   Ascorbic Acid (VITAMIN C) 500 MG CHEW Chew by mouth 2 (two) times daily.   bisacodyl (DULCOLAX) 10 MG suppository 10 mg. If not relieved by MOM, give 10 mg Bisacodyl suppositiory rectally X 1 dose in 24 hours as needed (Do not use constipation standing orders for residents with renal failure/CFR less than 30. Contact MD for orders)   brimonidine (ALPHAGAN) 0.2 % ophthalmic solution Place 1 drop into the left eye 3 (three) times daily.   dorzolamide-timolol (COSOPT) 22.3-6.8 MG/ML ophthalmic solution Place 1 drop into both eyes 2 (two) times daily.   feeding supplement, ENSURE ENLIVE, (ENSURE ENLIVE) LIQD Take 237 mLs by mouth 3 (three) times daily between meals.   ferrous sulfate 325 (65 FE) MG tablet Take 325 mg by mouth 2 (two) times daily.   latanoprost (XALATAN) 0.005 % ophthalmic solution Place 1 drop into both eyes at bedtime.   magnesium hydroxide (MILK OF MAGNESIA) 400 MG/5ML suspension If  no BM in 3 days, give 30 cc Milk of Magnesium p.o. x 1 dose in 24 hours as needed (Do not use standing constipation orders for residents with renal failure CFR less than 30. Contact MD for orders)   metoprolol tartrate (LOPRESSOR) 25 MG tablet Take 25 mg by mouth 2 (two) times daily.   mirtazapine (REMERON) 15 MG tablet Take 15 mg by mouth at bedtime.   NON FORMULARY MECHANICAL SOFT, THIN LIQUIDS   pantoprazole (PROTONIX) 40 MG tablet Take 1 tablet (40 mg total) by mouth daily.   polyethylene  glycol (MIRALAX / GLYCOLAX) 17 g packet Take 17 g by mouth daily.   RESTASIS 0.05 % ophthalmic emulsion Place 1 drop into both eyes 2 (two) times daily.   sennosides-docusate sodium (SENOKOT-S) 8.6-50 MG tablet Take 2 tablets by mouth 2 (two) times daily as needed for constipation.   Sodium Phosphates (RA SALINE ENEMA RE) If not relieved by Biscodyl suppository, give disposable Saline Enema rectally X 1 dose/24 hrs as needed (Do not use constipation standing orders for residents with renal failure/CFR less than 30. Contact MD for orders)   tamsulosin (FLOMAX) 0.4 MG CAPS capsule Take 0.4 mg by mouth at bedtime.   traMADol (ULTRAM) 50 MG tablet Take 1 tablet (50 mg total) by mouth every 12 (twelve) hours.   UNABLE TO FIND Med Name:Magic cup once daily to promote weight gain   [DISCONTINUED] OXYGEN Inhale 2 L into the lungs. O2 at 2L/MIN VIA NASAL CANNULA CONTINUOUSLY   No facility-administered encounter medications on file as of 08/27/2021.    Review of Systems  Unable to obtain due to hard of hearing and cognitive deficits.    Immunization History  Administered Date(s) Administered   DTaP 07/18/2017   Influenza-Unspecified 08/17/2020   Moderna Sars-Covid-2 Vaccination 11/30/2019, 12/28/2019, 09/20/2020   Pneumococcal-Unspecified 08/17/2020   Pertinent  Health Maintenance Due  Topic Date Due   COLONOSCOPY (Pts 45-79yrs Insurance coverage will need to be confirmed)  Never done   INFLUENZA VACCINE  06/17/2021   No flowsheet data found.   Vitals:   08/27/21 1111  BP: 100/65  Pulse: 76  Resp: (!) 21  Temp: 97.7 F (36.5 C)  Height: 5\' 10"  (1.778 m)   Body mass index is 13.77 kg/m.  Physical Exam  GENERAL APPEARANCE:  In no acute distress. Cachectic. SKIN:  Skin is warm and dry.  MOUTH and THROAT: Lips are without lesions. Oral mucosa is moist and without lesions.  RESPIRATORY: Breathing is even & unlabored, BS CTAB CARDIAC: RRR, no murmur,no extra heart sounds, no  edema GI: Abdomen soft, normal BS, no masses, no tenderness NEUROLOGICAL: There is no tremor. Speech is clear. Hard of hearing. PSYCHIATRIC:  Affect and behavior are appropriate  Labs reviewed: Recent Labs    06/08/21 0510 06/09/21 0605 06/10/21 0457 06/13/21 0551 06/21/21 0000 07/11/21 0000 07/15/21 0000  NA 132* 136 135 137 141 138 139  K 4.3 4.0 3.8 3.8 4.4 4.1 4.7  CL 99 97* 100 106 102 99 101  CO2 24 25 24 22  23* 26* 22  GLUCOSE 109* 124* 116* 105*  --   --   --   BUN 18 17 19 22 14  30* 23*  CREATININE 1.59* 1.44* 1.35* 1.08 1.3 1.7* 1.0  CALCIUM 9.2 9.5 8.7* 9.1 9.7 9.6 9.6  MG 1.9  --   --   --   --   --   --   PHOS 2.7  --   --   --   --   --   --  Recent Labs    07/11/21 0000  AST 25  ALT 10  ALKPHOS 158*  ALBUMIN 3.0*   Recent Labs    06/04/21 1318 06/05/21 0415 06/10/21 0457 06/10/21 1535 06/11/21 0538 06/13/21 0551 06/21/21 0000 07/11/21 0000 07/15/21 0000 07/23/21 0000  WBC 8.7   < > 14.0*  --  12.0* 12.3*   < > 14.7 7.9 4.4  NEUTROABS 5.1  --   --   --   --   --   --   --   --   --   HGB 11.7*   < > 8.7*   < > 8.5* 9.1*   < > 8.3* 8.7* 8.0*  HCT 38.4*   < > 27.7*   < > 27.1* 29.7*   < > 26* 28* 26*  MCV 98.2   < > 95.2  --  94.8 95.5  --   --   --   --   PLT 186   < > 346  --  362 586*   < > 380 410* 666*   < > = values in this interval not displayed.   Lab Results  Component Value Date   TSH 0.260 (L) 11/03/2016   No results found for: HGBA1C No results found for: CHOL, HDL, LDLCALC, LDLDIRECT, TRIG, CHOLHDL  Significant Diagnostic Results in last 30 days:  No results found.  Assessment/Plan  1. Urinary retention -  has chronic foley catheter -   catheter care  2. Failure to thrive in adult -  continue Remeron -  followed by hospice  3. Bilateral pulmonary embolism (HCC) Chronic deep vein thrombosis (DVT) of left lower extremity, unspecified vein (HCC) -  no SOB, continue Eliquis  4. Chronic GERD -  stable, continue  Pantoprazole  5. Poor appetite Wt Readings from Last 3 Encounters:  08/21/21 96 lb (43.5 kg)  08/01/21 94 lb (42.6 kg)  07/16/21 97 lb 12.8 oz (44.4 kg)  Body mass index is 13.77 kg/m.  -   cachectic, continue Remeron  6.  Neurocognitive deficits -  BIMS score 5/15, ranging in severe cognitive impairment -  continue supportive care    Family/ staff Communication:  Discussed plan of care with charge nurse.  Labs/tests ordered:  None  Goals of care:   Long-term care/Hospice care   Kenard Gower, DNP, MSN, FNP-BC [facility] [phone] (Monday-Friday 8:00 a.m. - 5:00 p.m.) [phone] (after hours)

## 2021-10-01 ENCOUNTER — Encounter: Payer: Self-pay | Admitting: Internal Medicine

## 2021-10-01 ENCOUNTER — Non-Acute Institutional Stay (SKILLED_NURSING_FACILITY): Admitting: Internal Medicine

## 2021-10-01 DIAGNOSIS — D509 Iron deficiency anemia, unspecified: Secondary | ICD-10-CM

## 2021-10-01 DIAGNOSIS — N179 Acute kidney failure, unspecified: Secondary | ICD-10-CM | POA: Diagnosis not present

## 2021-10-01 DIAGNOSIS — R627 Adult failure to thrive: Secondary | ICD-10-CM

## 2021-10-01 DIAGNOSIS — E43 Unspecified severe protein-calorie malnutrition: Secondary | ICD-10-CM

## 2021-10-01 NOTE — Progress Notes (Signed)
   NURSING HOME LOCATION:  [facility] ROOM NUMBER:  [id number]  CODE STATUS:  DNR  PCP:  [facility]  This is a nursing facility follow up visit of chronic medical diagnoses & to document compliance with Regulation 483.30 (c) in The Long Term Care Survey Manual Phase 2 which mandates caregiver visit ( visits can alternate among physician, PA or NP as per statutes) within 10 days of 30 days / 60 days/ 90 days post admission to SNF date    Interim medical record and care since last SNF visit was updated with review of diagnostic studies and change in clinical status since last visit were documented.  HPI: He has been a resident of this facility since 06/13/2021 after hospitalization for bilateral PTE involving the right lower, left lower, and left upper lobs. Extensive DVT was noted on venous duplex involving the left femoral vein, left popliteal vein, left posterior tibial vein and left peroneal veins.  Heparin drip was transitioned to Eliquis.  Hospital course was complicated by aspiration pneumonia, and AKI superimposed on CKD stage IIIa. Other medical diagnoses include glaucoma and essential hypertension. He was a former every day smoker prior to admission.  Most current labs reveal resolution of AKI with a creatinine of 0.78 and GFR greater than 90 on 07/17/2021.  His creatinine had peaked at 1.59 with a GFR of 46,indicating CKD stage IIIa.  Anemia has improved recently with H/H of 8/26.6 on 07/23/2021.  [place]ir H/H had been 7.3/23.8.  Indices were normocytic, slightly hypochromic.  Review of systems: He is essentially deaf.  He speaks in a low voice & speech is almost indiscernible.  His only response to questions was "fine" mumbled in almost incoherent fashion.  Physical exam:  Pertinent or positive findings: He appears grossly cachectic & older than stated age. He has pattern alopecia and remaining hair is disheveled.  He has an unkempt beard and  mustache.  Eyes are sunken.  Maxilla is edentulous.  He has a few remaining mandibular teeth.  The right clavicular head appears enlarged compared to the left.  Heart sounds are markedly distant,heard best in the epigastrium.  Breath sounds are decreased and exhibit bronchovesicular quality.  Foley catheter is present.  Pedal pulses are decreased; posterior tibial pulses are decreased greater than the dorsalis pedis pulses.  He has gross atrophy of the extremities.  General appearance:  no acute distress, increased work of breathing is present.   Lymphatic: No lymphadenopathy about the head, neck, axilla. Eyes: No conjunctival inflammation or lid edema is present. There is no scleral icterus. Ears:  External ear exam shows no significant lesions or deformities.   Nose:  External nasal examination shows no deformity or inflammation. Nasal mucosa are pink and moist without lesions, exudates Neck:  No thyromegaly, masses, tenderness noted.    Heart:  No gallop, murmur, click, rub .  Lungs: without wheezes, rhonchi, rales, rubs. Abdomen: Bowel sounds are normal. Abdomen is soft and nontender with no organomegaly, hernias, masses. GU: Deferred  Extremities:  No cyanosis, clubbing, edema  Neurologic exam :Balance, Rhomberg, finger to nose testing could not be completed due to clinical state Skin: Warm & dry . No significant lesions or rash.  See summary under each active problem in the Problem List with associated updated therapeutic plan

## 2021-10-01 NOTE — Assessment & Plan Note (Addendum)
Clinically he is grossly cachectic with profound diffuse muscle wasting.  Nutritionist we will continue to follow @ SNF.

## 2021-10-01 NOTE — Assessment & Plan Note (Addendum)
07/17/2021 creatinine is 0.78 with a GFR greater than 90. Gross muscle wasting may have an impact on the creatinine. No change in meds indicated

## 2021-10-01 NOTE — Assessment & Plan Note (Signed)
07/23/2021 anemia has improved slightly with H/H of 8/26.6; nadir H/H had been 7.3/23.8.  Indices were normocytic, slightly hypochromic.  No bleeding dyscrasias reported at the SNF.

## 2021-10-02 DIAGNOSIS — R627 Adult failure to thrive: Secondary | ICD-10-CM | POA: Insufficient documentation

## 2021-10-02 NOTE — Assessment & Plan Note (Signed)
DNR appropriate. Transition to Hospice as clinically indicated.

## 2021-10-02 NOTE — Patient Instructions (Signed)
See assessment and plan under each diagnosis in the problem list and acutely for this visit 

## 2021-10-10 ENCOUNTER — Other Ambulatory Visit: Payer: Self-pay | Admitting: Adult Health

## 2021-10-10 MED ORDER — TRAMADOL HCL 50 MG PO TABS: 50.0000 mg | ORAL_TABLET | Freq: Two times a day (BID) | ORAL | 0 refills | Status: DC

## 2021-10-25 ENCOUNTER — Non-Acute Institutional Stay (SKILLED_NURSING_FACILITY): Payer: Medicare Other | Admitting: Adult Health

## 2021-10-25 ENCOUNTER — Encounter: Payer: Self-pay | Admitting: Adult Health

## 2021-10-25 DIAGNOSIS — K219 Gastro-esophageal reflux disease without esophagitis: Secondary | ICD-10-CM

## 2021-10-25 DIAGNOSIS — R339 Retention of urine, unspecified: Secondary | ICD-10-CM | POA: Diagnosis not present

## 2021-10-25 DIAGNOSIS — I2699 Other pulmonary embolism without acute cor pulmonale: Secondary | ICD-10-CM | POA: Diagnosis not present

## 2021-10-25 DIAGNOSIS — R630 Anorexia: Secondary | ICD-10-CM

## 2021-10-25 DIAGNOSIS — R627 Adult failure to thrive: Secondary | ICD-10-CM

## 2021-10-25 DIAGNOSIS — I82502 Chronic embolism and thrombosis of unspecified deep veins of left lower extremity: Secondary | ICD-10-CM

## 2021-10-25 NOTE — Progress Notes (Signed)
Location:  [facility] Room Number: 222 Place of Service:  SNF (31) Provider:  Kenard Gower, DNP, FNP-BC  Patient Care Team: [facility], [facility] as PCP - General (Geriatric Medicine)  Extended Emergency Contact Information Primary Emergency Contact: Copeland,Dorothy  [place] Home Phone: [phone] Relation: Sister  Code Status:  DNR  Goals of care: Advanced Directive information Advanced Directives 10/25/2021  Does Patient Have a Medical Advance Directive? Yes  Type of Estate agent of [place];Living will;Out of facility DNR (pink MOST or yellow form)  Does patient want to make changes to medical advance directive? No - Patient declined  Copy of Healthcare Power of Attorney in Chart? Yes - validated most recent copy scanned in chart (See row information)  Would patient like information on creating a medical advance directive? -  Pre-existing out of facility DNR order (yellow form or pink MOST form) Yellow form placed in chart (order not valid for inpatient use)     Chief Complaint  Patient presents with   Medical Management of Chronic Issues    Routine follow up visit.    HPI:  Pt is a 70 y.o. male seen today for medical management of chronic diseases. He is a long-term care resident of [facility]. He is being followed by hospice.  He has a PMH of hypertension and hard of hearing. He takes Rapaflo 0.4 mg 1 capsule at bedtime for urinary retention. He has chronic foley catheter.  Latest weight is 92.2 lbs, gained 0.8 lbs in 1 month. He takes Remeron 15 mg at bedtime to increase appetite. No SOB.  Eliquis was discontinued on 09/19/2021. He has O2 @ 2L/min via [place].    Past Medical History:  Diagnosis Date   Glaucoma    HOH (hard of hearing)    Hypertension    Past Surgical History:  Procedure Laterality Date   ARTHROTOMY Left 07/21/2014   Procedure: ARTHROTOMY; IRRIGATION AND  DRAINAGE OF LEFT WRIST INFECTION;  Surgeon: Eldred Manges, MD;  Location: [facility] OR;  Service: Orthopedics;  Laterality: Left;    Allergies  Allergen Reactions   Lactose Intolerance (Gi)     UNK reaction    Outpatient Encounter Medications as of 10/25/2021  Medication Sig   acetaminophen (TYLENOL) 325 MG tablet Take 650 mg by mouth every 6 (six) hours as needed.   albuterol (VENTOLIN HFA) 108 (90 Base) MCG/ACT inhaler Inhale 2 puffs into the lungs every 4 (four) hours as needed for wheezing or shortness of breath.   bisacodyl (DULCOLAX) 10 MG suppository 10 mg. If not relieved by MOM, give 10 mg Bisacodyl suppositiory rectally X 1 dose in 24 hours as needed (Do not use constipation standing orders for residents with renal failure/CFR less than 30. Contact MD for orders)   brimonidine (ALPHAGAN) 0.2 % ophthalmic solution Place 1 drop into the left eye 3 (three) times daily.   dorzolamide-timolol (COSOPT) 22.3-6.8 MG/ML ophthalmic solution Place 1 drop into both eyes 2 (two) times daily.   feeding supplement, ENSURE ENLIVE, (ENSURE ENLIVE) LIQD Take 237 mLs by mouth 3 (three) times daily between meals.   latanoprost (XALATAN) 0.005 % ophthalmic solution Place 1 drop into both eyes at bedtime.   magnesium hydroxide (MILK OF MAGNESIA) 400 MG/5ML suspension If no BM in 3 days, give 30 cc Milk of Magnesium p.o. x 1 dose in 24 hours as needed (Do not use standing constipation orders for residents with renal failure CFR less than 30.  Contact MD for orders)   mirtazapine (REMERON) 15 MG tablet Take 15 mg by mouth at bedtime.   NON FORMULARY MECHANICAL SOFT, THIN LIQUIDS   pantoprazole (PROTONIX) 40 MG tablet Take 1 tablet (40 mg total) by mouth daily.   polyethylene glycol (MIRALAX / GLYCOLAX) 17 g packet Take 17 g by mouth daily.   RESTASIS 0.05 % ophthalmic emulsion Place 1 drop into both eyes 2 (two) times daily.   sennosides-docusate sodium (SENOKOT-S) 8.6-50 MG tablet Take 2 tablets by mouth 2 (two)  times daily as needed for constipation.   Sodium Phosphates (RA SALINE ENEMA RE) If not relieved by Biscodyl suppository, give disposable Saline Enema rectally X 1 dose/24 hrs as needed (Do not use constipation standing orders for residents with renal failure/CFR less than 30. Contact MD for orders)   traMADol (ULTRAM) 50 MG tablet Take 1 tablet (50 mg total) by mouth every 12 (twelve) hours.   UNABLE TO FIND Med Name:Magic cup once daily to promote weight gain   [DISCONTINUED] apixaban (ELIQUIS) 2.5 MG TABS tablet Take 2.5 mg by mouth 2 (two) times daily.   [DISCONTINUED] Ascorbic Acid (VITAMIN C) 500 MG CHEW Chew by mouth 2 (two) times daily.   [DISCONTINUED] ferrous sulfate 325 (65 FE) MG tablet Take 325 mg by mouth 2 (two) times daily.   [DISCONTINUED] metoprolol tartrate (LOPRESSOR) 25 MG tablet Take 25 mg by mouth 2 (two) times daily.   [DISCONTINUED] tamsulosin (FLOMAX) 0.4 MG CAPS capsule Take 0.4 mg by mouth at bedtime.   No facility-administered encounter medications on file as of 10/25/2021.    Review of Systems Unable to obtain due to hard of hearing.   Immunization History  Administered Date(s) Administered   DTaP 07/18/2017   Influenza-Unspecified 08/17/2020   Moderna Sars-Covid-2 Vaccination 11/30/2019, 12/28/2019, 09/20/2020   Pneumococcal-Unspecified 08/17/2020   Pertinent  Health Maintenance Due  Topic Date Due   COLONOSCOPY (Pts 45-20yrs Insurance coverage will need to be confirmed)  Never done   INFLUENZA VACCINE  06/17/2021   Fall Risk 06/11/2021 06/11/2021 06/12/2021 06/13/2021 06/13/2021  Patient Fall Risk Level Moderate fall risk Moderate fall risk Moderate fall risk Moderate fall risk Moderate fall risk     Vitals:   10/25/21 1129  BP: 107/74  Pulse: 80  Resp: 18  Temp: (!) 97.2 F (36.2 C)  Weight: 92 lb 3.2 oz (41.8 kg)  Height: 5\' 10"  (1.778 m)   Body mass index is 13.23 kg/m.  Physical Exam  GENERAL APPEARANCE:  In no acute distress.  SKIN:   Skin is warm and dry.  RESPIRATORY: Breathing is even & unlabored, BS CTAB CARDIAC: RRR, no murmur,no extra heart sounds, no edema GI: Abdomen soft, normal BS, no masses, no tenderness, NEUROLOGICAL: There is no tremor. Speech is clear. Hard of hearing. PSYCHIATRIC:  Affect and behavior are appropriate  Labs reviewed: Recent Labs    06/08/21 0510 06/09/21 0605 06/10/21 0457 06/13/21 0551 06/21/21 0000 07/11/21 0000 07/15/21 0000  NA 132* 136 135 137 141 138 139  K 4.3 4.0 3.8 3.8 4.4 4.1 4.7  CL 99 97* 100 106 102 99 101  CO2 24 25 24 22  23* 26* 22  GLUCOSE 109* 124* 116* 105*  --   --   --   BUN 18 17 19 22 14  30* 23*  CREATININE 1.59* 1.44* 1.35* 1.08 1.3 1.7* 1.0  CALCIUM 9.2 9.5 8.7* 9.1 9.7 9.6 9.6  MG 1.9  --   --   --   --   --   --  PHOS 2.7  --   --   --   --   --   --    Recent Labs    07/11/21 0000  AST 25  ALT 10  ALKPHOS 158*  ALBUMIN 3.0*   Recent Labs    06/04/21 1318 06/05/21 0415 06/10/21 0457 06/10/21 1535 06/11/21 0538 06/13/21 0551 06/21/21 0000 07/11/21 0000 07/15/21 0000 07/23/21 0000  WBC 8.7   < > 14.0*  --  12.0* 12.3*   < > 14.7 7.9 4.4  NEUTROABS 5.1  --   --   --   --   --   --   --   --   --   HGB 11.7*   < > 8.7*   < > 8.5* 9.1*   < > 8.3* 8.7* 8.0*  HCT 38.4*   < > 27.7*   < > 27.1* 29.7*   < > 26* 28* 26*  MCV 98.2   < > 95.2  --  94.8 95.5  --   --   --   --   PLT 186   < > 346  --  362 586*   < > 380 410* 666*   < > = values in this interval not displayed.   Lab Results  Component Value Date   TSH 0.260 (L) 11/03/2016   No results found for: HGBA1C No results found for: CHOL, HDL, LDLCALC, LDLDIRECT, TRIG, CHOLHDL  Significant Diagnostic Results in last 30 days:  No results found.  Assessment/Plan  1. Urinary retention -  continue Rapaflo -  Foley catheter care daily  2. Poor appetite Wt Readings from Last 3 Encounters:  10/25/21 92 lb 3.2 oz (41.8 kg)  08/27/21 96 lb (43.5 kg)  08/21/21 96 lb (43.5 kg)    - he is cachectic -  continue Remeron  3. Bilateral pulmonary embolism (HCC)  Chronic deep vein thrombosis (DVT) of left lower extremity, unspecified vein (HCC) -  on O2 @ 2L/min via [place] -  Eliquis  4. Chronic GERD -   stable, continue Protonix  5. Failure to thrive syndrome, adult -  continue comfort care   Family/ staff Communication:   Discussed plan of care with charge nurse.  Labs/tests ordered:  None  Goals of care:   Long-term care/hospice care   Kenard Gower, DNP, MSN, FNP-BC [facility] [phone] (Monday-Friday 8:00 a.m. - 5:00 p.m.) [phone] (after hours)

## 2021-11-12 ENCOUNTER — Other Ambulatory Visit: Payer: Self-pay | Admitting: Adult Health

## 2021-11-12 MED ORDER — TRAMADOL HCL 50 MG PO TABS: 50.0000 mg | ORAL_TABLET | Freq: Two times a day (BID) | ORAL | 0 refills | Status: DC

## 2021-11-26 ENCOUNTER — Non-Acute Institutional Stay (SKILLED_NURSING_FACILITY): Payer: Medicare Other | Admitting: Adult Health

## 2021-11-26 ENCOUNTER — Encounter: Payer: Self-pay | Admitting: Adult Health

## 2021-11-26 DIAGNOSIS — R627 Adult failure to thrive: Secondary | ICD-10-CM

## 2021-11-26 DIAGNOSIS — R630 Anorexia: Secondary | ICD-10-CM

## 2021-11-26 DIAGNOSIS — R339 Retention of urine, unspecified: Secondary | ICD-10-CM

## 2021-11-26 DIAGNOSIS — K219 Gastro-esophageal reflux disease without esophagitis: Secondary | ICD-10-CM | POA: Diagnosis not present

## 2021-11-26 DIAGNOSIS — R1312 Dysphagia, oropharyngeal phase: Secondary | ICD-10-CM

## 2021-11-26 MED ORDER — SILODOSIN 4 MG PO CAPS: 4.0000 mg | ORAL_CAPSULE | Freq: Every day | ORAL | 0 refills | Status: AC

## 2021-11-26 NOTE — Progress Notes (Signed)
Location:  [facility] Room Number: 222 B Place of Service:  SNF (31) Provider:  Kenard Gower, DNP, FNP-BC  Patient Care Team: Pecola Lawless, MD as PCP - General (Internal Medicine)  Extended Emergency Contact Information Primary Emergency Contact: Copeland,Dorothy  [place] Home Phone: [phone] Relation: Sister  Code Status:   DNR  Goals of care: Advanced Directive information Advanced Directives 10/25/2021  Does Patient Have a Medical Advance Directive? Yes  Type of Estate agent of [place];Living will;Out of facility DNR (pink MOST or yellow form)  Does patient want to make changes to medical advance directive? No - Patient declined  Copy of Healthcare Power of Attorney in Chart? Yes - validated most recent copy scanned in chart (See row information)  Would patient like information on creating a medical advance directive? -  Pre-existing out of facility DNR order (yellow form or pink MOST form) Yellow form placed in chart (order not valid for inpatient use)     Chief Complaint  Patient presents with   Medical Management of Chronic Issues    Routine Visit    HPI:  Pt is a 71 y.o. male seen today for medical management of chronic diseases. He is a long-term care resident of [facility]. He is followed by hospice.   Chronic GERD  -  denies chest pain nor abdominal pain, currently stable.  Omeprazole 20 mg 1 capsule twice a day  Urinary retention -  has chronic indwelling Foley catheter French 14, takes Flomax 0.4 mg 1 capsule daily. He has difficulty swallowing pill. Flomax cannot be crushed.   Past Medical History:  Diagnosis Date   Glaucoma    HOH (hard of hearing)    Hypertension    Past Surgical History:  Procedure Laterality Date   ARTHROTOMY Left 07/21/2014   Procedure: ARTHROTOMY; IRRIGATION AND DRAINAGE OF LEFT WRIST INFECTION;  Surgeon: Eldred Manges, MD;   Location: [facility] OR;  Service: Orthopedics;  Laterality: Left;    Allergies  Allergen Reactions   Lactose Intolerance (Gi)     UNK reaction    Outpatient Encounter Medications as of 11/26/2021  Medication Sig   silodosin (RAPAFLO) 4 MG CAPS capsule Take 1 capsule (4 mg total) by mouth daily with breakfast.   acetaminophen (TYLENOL) 325 MG tablet Take 650 mg by mouth every 6 (six) hours as needed.   albuterol (VENTOLIN HFA) 108 (90 Base) MCG/ACT inhaler Inhale 2 puffs into the lungs every 4 (four) hours as needed for wheezing or shortness of breath.   bisacodyl (DULCOLAX) 10 MG suppository 10 mg. If not relieved by MOM, give 10 mg Bisacodyl suppositiory rectally X 1 dose in 24 hours as needed (Do not use constipation standing orders for residents with renal failure/CFR less than 30. Contact MD for orders)   brimonidine (ALPHAGAN) 0.2 % ophthalmic solution Place 1 drop into the left eye 3 (three) times daily.   dorzolamide-timolol (COSOPT) 22.3-6.8 MG/ML ophthalmic solution Place 1 drop into both eyes 2 (two) times daily.   feeding supplement, ENSURE ENLIVE, (ENSURE ENLIVE) LIQD Take 237 mLs by mouth 3 (three) times daily between meals.   latanoprost (XALATAN) 0.005 % ophthalmic solution Place 1 drop into both eyes at bedtime.   magnesium hydroxide (MILK OF MAGNESIA) 400 MG/5ML suspension If no BM in 3 days, give 30 cc Milk of Magnesium p.o. x 1 dose in 24 hours as needed (Do not use standing constipation orders for residents with renal  failure CFR less than 30. Contact MD for orders)   mirtazapine (REMERON) 15 MG tablet Take 15 mg by mouth at bedtime.   NON FORMULARY MECHANICAL SOFT, THIN LIQUIDS   pantoprazole (PROTONIX) 40 MG tablet Take 1 tablet (40 mg total) by mouth daily.   polyethylene glycol (MIRALAX / GLYCOLAX) 17 g packet Take 17 g by mouth daily.   RESTASIS 0.05 % ophthalmic emulsion Place 1 drop into both eyes 2 (two) times daily.   sennosides-docusate sodium (SENOKOT-S) 8.6-50 MG  tablet Take 2 tablets by mouth 2 (two) times daily as needed for constipation.   Sodium Phosphates (RA SALINE ENEMA RE) If not relieved by Biscodyl suppository, give disposable Saline Enema rectally X 1 dose/24 hrs as needed (Do not use constipation standing orders for residents with renal failure/CFR less than 30. Contact MD for orders)   traMADol (ULTRAM) 50 MG tablet Take 1 tablet (50 mg total) by mouth every 12 (twelve) hours.   UNABLE TO FIND Med Name:Magic cup once daily to promote weight gain   No facility-administered encounter medications on file as of 11/26/2021.    Review of Systems   Unable to obtain due to severe hard of hearing.   Immunization History  Administered Date(s) Administered   DTaP 07/18/2017   Influenza-Unspecified 08/17/2020   Moderna Sars-Covid-2 Vaccination 11/30/2019, 12/28/2019, 09/20/2020   Pneumococcal-Unspecified 08/17/2020   Pertinent  Health Maintenance Due  Topic Date Due   COLONOSCOPY (Pts 45-5240yrs Insurance coverage will need to be confirmed)  Never done   INFLUENZA VACCINE  06/17/2021   Fall Risk 06/11/2021 06/11/2021 06/12/2021 06/13/2021 06/13/2021  Patient Fall Risk Level Moderate fall risk Moderate fall risk Moderate fall risk Moderate fall risk Moderate fall risk     Vitals:   11/26/21 1649  BP: (!) 100/56  Pulse: 73  Resp: 18  Temp: 97.7 F (36.5 C)  Weight: 93 lb (42.2 kg)  Height: 5\' 10"  (1.778 m)   Body mass index is 13.34 kg/m.  Physical Exam Constitutional:      Comments: Cachectic.  HENT:     Head: Normocephalic and atraumatic.     Nose: Nose normal.     Mouth/Throat:     Mouth: Mucous membranes are moist.  Eyes:     Conjunctiva/sclera: Conjunctivae normal.  Cardiovascular:     Rate and Rhythm: Normal rate and regular rhythm.  Pulmonary:     Effort: Pulmonary effort is normal.     Breath sounds: Normal breath sounds.  Abdominal:     General: Abdomen is flat. Bowel sounds are normal.     Palpations: Abdomen is  soft.  Genitourinary:    Comments: Foley catheter french 14. Musculoskeletal:        General: No swelling.  Skin:    General: Skin is warm and dry.  Neurological:     Mental Status: He is alert. Mental status is at baseline.     Comments: Severely hard of hearing.  Psychiatric:        Mood and Affect: Mood normal.        Behavior: Behavior normal.       Labs reviewed: Recent Labs    06/08/21 0510 06/09/21 0605 06/10/21 0457 06/13/21 0551 06/21/21 0000 07/11/21 0000 07/15/21 0000  NA 132* 136 135 137 141 138 139  K 4.3 4.0 3.8 3.8 4.4 4.1 4.7  CL 99 97* 100 106 102 99 101  CO2 24 25 24 22  23* 26* 22  GLUCOSE 109* 124* 116* 105*  --   --   --  BUN 18 17 19 22 14  30* 23*  CREATININE 1.59* 1.44* 1.35* 1.08 1.3 1.7* 1.0  CALCIUM 9.2 9.5 8.7* 9.1 9.7 9.6 9.6  MG 1.9  --   --   --   --   --   --   PHOS 2.7  --   --   --   --   --   --    Recent Labs    07/11/21 0000  AST 25  ALT 10  ALKPHOS 158*  ALBUMIN 3.0*   Recent Labs    06/04/21 1318 06/05/21 0415 06/10/21 0457 06/10/21 1535 06/11/21 0538 06/13/21 0551 06/21/21 0000 07/11/21 0000 07/15/21 0000 07/23/21 0000  WBC 8.7   < > 14.0*  --  12.0* 12.3*   < > 14.7 7.9 4.4  NEUTROABS 5.1  --   --   --   --   --   --   --   --   --   HGB 11.7*   < > 8.7*   < > 8.5* 9.1*   < > 8.3* 8.7* 8.0*  HCT 38.4*   < > 27.7*   < > 27.1* 29.7*   < > 26* 28* 26*  MCV 98.2   < > 95.2  --  94.8 95.5  --   --   --   --   PLT 186   < > 346  --  362 586*   < > 380 410* 666*   < > = values in this interval not displayed.   Lab Results  Component Value Date   TSH 0.260 (L) 11/03/2016   No results found for: HGBA1C No results found for: CHOL, HDL, LDLCALC, LDLDIRECT, TRIG, CHOLHDL  Significant Diagnostic Results in last 30 days:  No results found.  Assessment/Plan  1. Oropharyngeal dysphagia -  has difficulty swallowing pill and Flomax cannot be crushed -   will discontinue Flomax and start on Rapaflo 4 mg at HS.  Rapaflo can    be crushed  2. Chronic GERD -   stable, continue Omeprazole  3. Urinary retention -   will change Flomax to Rapaflo which can be crushed for easier swallowing -    continue foley catheter -     catheter care daily  4. Poor appetite -  continue Mirtazapine and supplementation with Ensure  5. Failure to thrive in adult Wt Readings from Last 3 Encounters:  11/26/21 93 lb (42.2 kg)  10/25/21 92 lb 3.2 oz (41.8 kg)  08/27/21 96 lb (43.5 kg)   -  followed by hospice    Family/ staff Communication: Discussed plan of care with charge nurse.  Labs/tests ordered:  None    10/27/21, DNP, MSN, FNP-BC [facility] [phone] (Monday-Friday 8:00 a.m. - 5:00 p.m.) [phone] (after hours)

## 2021-12-11 ENCOUNTER — Other Ambulatory Visit: Payer: Self-pay | Admitting: Adult Health

## 2021-12-11 MED ORDER — TRAMADOL HCL 50 MG PO TABS: 50.0000 mg | ORAL_TABLET | Freq: Two times a day (BID) | ORAL | 0 refills | Status: AC

## 2021-12-12 ENCOUNTER — Encounter: Payer: Self-pay | Admitting: Adult Health

## 2021-12-12 ENCOUNTER — Non-Acute Institutional Stay (SKILLED_NURSING_FACILITY): Payer: Medicare Other | Admitting: Adult Health

## 2021-12-12 DIAGNOSIS — R627 Adult failure to thrive: Secondary | ICD-10-CM

## 2021-12-12 DIAGNOSIS — R339 Retention of urine, unspecified: Secondary | ICD-10-CM

## 2021-12-12 DIAGNOSIS — K219 Gastro-esophageal reflux disease without esophagitis: Secondary | ICD-10-CM

## 2021-12-12 DIAGNOSIS — Z86711 Personal history of pulmonary embolism: Secondary | ICD-10-CM | POA: Diagnosis not present

## 2021-12-12 DIAGNOSIS — G894 Chronic pain syndrome: Secondary | ICD-10-CM | POA: Diagnosis not present

## 2021-12-12 DIAGNOSIS — Z86718 Personal history of other venous thrombosis and embolism: Secondary | ICD-10-CM | POA: Diagnosis not present

## 2021-12-12 DIAGNOSIS — H409 Unspecified glaucoma: Secondary | ICD-10-CM

## 2021-12-12 NOTE — Progress Notes (Signed)
Location:  [facility] Room Number: 222-B Place of Service:  SNF (31) Provider:  Durenda Age, DNP, FNP-BC  Patient Care Team: Hendricks Limes, MD as PCP - General (Internal Medicine)  Extended Emergency Contact Information Primary Emergency Contact: Copeland,Dorothy  [place] Phone: [phone] Relation: Sister  Code Status:  DNR  Goals of care: Advanced Directive information Advanced Directives 12/12/2021  Does Patient Have a Medical Advance Directive? Yes  Type of Paramedic of [place];Living will;Out of facility DNR (pink MOST or yellow form)  Does patient want to make changes to medical advance directive? No - Patient declined  Copy of [place] in Chart? Yes - validated most recent copy scanned in chart (See row information)  Would patient like information on creating a medical advance directive? -  Pre-existing out of facility DNR order (yellow form or pink MOST form) -     Chief Complaint  Patient presents with   Discharge Note    Discharge from [facility] 12/12/2021    HPI:  Pt is a 71 y.o. male who is for transfer to [facility] in [place], [place] on 12/16/21.  He was admitted to [facility] on 06/13/21 post hospitalization 06/04/21 to 06/13/21.  He has a PMH of hypertension and hard of hearing.  He was hospitalized for shortness of breath.  CT chest showed right lower, left lower and left upper lobe segmental pulmonary embolism, no right heart strain or pulmonary infarction noted.  He was, also, noted for LLE DVT.  He was started on heparin drip and subsequently transitioned to Eliquis.  He continued to remain hypoxic requiring supplemental oxygen.  Echocardiogram showed preserved RV systolic function.    At [facility], he had short-term rehabilitation. On 9/23, he transitioned to hospice care for failure to thrive. 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  Eliquis has been discontinued.   Past Medical History:  Diagnosis Date   Glaucoma    HOH (hard of hearing)    Hypertension    Past Surgical History:  Procedure Laterality Date   ARTHROTOMY Left 07/21/2014   Procedure: ARTHROTOMY; IRRIGATION AND DRAINAGE OF LEFT WRIST INFECTION;  Surgeon: Marybelle Killings, MD;  Location: [facility];  Service: Orthopedics;  Laterality: Left;    Allergies  Allergen Reactions   Lactose Intolerance (Gi)     UNK reaction    Outpatient Encounter Medications as of 12/12/2021  Medication Sig   acetaminophen (TYLENOL) 325 MG tablet Take 650 mg by mouth every 6 (six) hours as needed.   albuterol (VENTOLIN HFA) 108 (90 Base) MCG/ACT inhaler Inhale 2 puffs into the lungs every 4 (four) hours as needed for wheezing or shortness of breath.   bisacodyl (DULCOLAX) 10 MG suppository 10 mg. If not relieved by MOM, give 10 mg Bisacodyl suppositiory rectally X 1 dose in 24 hours as needed (Do not use constipation standing orders for residents with renal failure/CFR less than 30. Contact MD for orders)   brimonidine (ALPHAGAN) 0.2 % ophthalmic solution Place 1 drop into the left eye 3 (three) times daily.   feeding supplement, ENSURE ENLIVE, (ENSURE ENLIVE) LIQD Take 237 mLs by mouth 3 (three) times daily between meals.   latanoprost (XALATAN) 0.005 % ophthalmic solution Place 1 drop into both eyes at bedtime.   magnesium hydroxide (MILK OF MAGNESIA) 400 MG/5ML suspension If no BM in 3 days, give 30 cc Milk of  Magnesium p.o. x 1 dose in 24 hours as needed (Do not use standing constipation orders for residents with renal failure CFR less than 30. Contact MD for orders)   mirtazapine (REMERON) 15 MG tablet Take 15 mg by mouth at bedtime.   NON FORMULARY MECHANICAL SOFT, THIN LIQUIDS   omeprazole (PRILOSEC) 20 MG capsule Take 20 mg by mouth at bedtime.   polyethylene glycol (MIRALAX / GLYCOLAX) 17 g packet Take 17 g by mouth daily.    RESTASIS 0.05 % ophthalmic emulsion Place 1 drop into both eyes 2 (two) times daily.   sennosides-docusate sodium (SENOKOT-S) 8.6-50 MG tablet Take 2 tablets by mouth 2 (two) times daily as needed for constipation.   silodosin (RAPAFLO) 4 MG CAPS capsule Take 1 capsule (4 mg total) by mouth daily with breakfast.   Sodium Phosphates (RA SALINE ENEMA RE) If not relieved by Biscodyl suppository, give disposable Saline Enema rectally X 1 dose/24 hrs as needed (Do not use constipation standing orders for residents with renal failure/CFR less than 30. Contact MD for orders)   traMADol (ULTRAM) 50 MG tablet Take 1 tablet (50 mg total) by mouth every 12 (twelve) hours.   UNABLE TO FIND Med Name:Magic cup once daily to promote weight gain   [DISCONTINUED] dorzolamide-timolol (COSOPT) 22.3-6.8 MG/ML ophthalmic solution Place 1 drop into both eyes 2 (two) times daily.   [DISCONTINUED] pantoprazole (PROTONIX) 40 MG tablet Take 1 tablet (40 mg total) by mouth daily.   No facility-administered encounter medications on file as of 12/12/2021.    Review of Systems  Constitutional:  Negative for activity change, appetite change and fever.  HENT:  Positive for hearing loss. Negative for sore throat.   Eyes: Negative.   Cardiovascular:  Negative for chest pain and leg swelling.  Gastrointestinal:  Negative for abdominal distention, diarrhea and vomiting.  Genitourinary:  Negative for hematuria.  Skin:  Negative for color change.  Neurological:  Negative for dizziness and headaches.  Psychiatric/Behavioral:  Negative for behavioral problems and sleep disturbance. The patient is not nervous/anxious.       Immunization History  Administered Date(s) Administered   DTaP 07/18/2017   Influenza-Unspecified 08/17/2020, 08/28/2021   Moderna Sars-Covid-2 Vaccination 11/30/2019, 12/28/2019, 09/20/2020   Pneumococcal-Unspecified 08/17/2020   Zoster Recombinat (Shingrix) 06/18/2021   Pertinent  Health Maintenance Due   Topic Date Due   COLONOSCOPY (Pts 45-22yrs Insurance coverage will need to be confirmed)  Never done   INFLUENZA VACCINE  Completed   Fall Risk 06/11/2021 06/11/2021 06/12/2021 06/13/2021 06/13/2021  Patient Fall Risk Level Moderate fall risk Moderate fall risk Moderate fall risk Moderate fall risk Moderate fall risk     Vitals:   12/12/21 0923  BP: 101/62  Pulse: 66  Temp: (!) 97 F (36.1 C)  Height: 5\' 10"  (1.778 m)   Body mass index is 13.34 kg/m.  Physical Exam Constitutional:      Comments: Cachectic.  HENT:     Head: Normocephalic and atraumatic.     Mouth/Throat:     Mouth: Mucous membranes are moist.  Eyes:     Conjunctiva/sclera: Conjunctivae normal.  Cardiovascular:     Rate and Rhythm: Normal rate and regular rhythm.     Pulses: Normal pulses.     Heart sounds: Normal heart sounds.  Pulmonary:     Effort: Pulmonary effort is normal.     Breath sounds: Normal breath sounds.  Abdominal:     General: Bowel sounds are normal.     Palpations: Abdomen is  soft.  Genitourinary:    Comments: Has foley catheter. Musculoskeletal:     Cervical back: Normal range of motion.  Skin:    General: Skin is warm and dry.  Neurological:     Mental Status: He is alert. Mental status is at baseline.     Comments: Hard of hearing.  Psychiatric:        Mood and Affect: Mood normal.        Behavior: Behavior normal.       Labs reviewed: Recent Labs    06/08/21 0510 06/09/21 0605 06/10/21 0457 06/13/21 0551 06/21/21 0000 07/11/21 0000 07/15/21 0000 07/17/21 0000  NA 132* 136 135 137   < > 138 139 139  K 4.3 4.0 3.8 3.8   < > 4.1 4.7 4.2  CL 99 97* 100 106   < > 99 101 102  CO2 24 25 24 22    < > 26* 22 27*  GLUCOSE 109* 124* 116* 105*  --   --   --   --   BUN 18 17 19 22    < > 30* 23* 13  CREATININE 1.59* 1.44* 1.35* 1.08   < > 1.7* 1.0 0.8  CALCIUM 9.2 9.5 8.7* 9.1   < > 9.6 9.6 8.7  MG 1.9  --   --   --   --   --   --   --   PHOS 2.7  --   --   --   --   --    --   --    < > = values in this interval not displayed.   Recent Labs    07/11/21 0000  AST 25  ALT 10  ALKPHOS 158*  ALBUMIN 3.0*   Recent Labs    06/04/21 1318 06/05/21 0415 06/10/21 0457 06/10/21 1535 06/11/21 0538 06/13/21 0551 06/21/21 0000 07/15/21 0000 07/17/21 0000 07/23/21 0000  WBC 8.7   < > 14.0*  --  12.0* 12.3*   < > 7.9 5.3 4.4  NEUTROABS 5.1  --   --   --   --   --   --   --   --   --   HGB 11.7*   < > 8.7*   < > 8.5* 9.1*   < > 8.7* 7.3* 8.0*  HCT 38.4*   < > 27.7*   < > 27.1* 29.7*   < > 28* 24* 26*  MCV 98.2   < > 95.2  --  94.8 95.5  --   --   --   --   PLT 186   < > 346  --  362 586*   < > 410* 449* 666*   < > = values in this interval not displayed.   Lab Results  Component Value Date   TSH 0.260 (L) 11/03/2016   No results found for: HGBA1C No results found for: CHOL, HDL, LDLCALC, LDLDIRECT, TRIG, CHOLHDL  Significant Diagnostic Results in last 30 days:  No results found.  Assessment/Plan  1. Failure to thrive in adult -   Continue Remeron -   Followed by hospice care  2. History of pulmonary embolism History of DVT of lower extremity - Eliquis has been discontinued -  no SOB  3. Chronic pain syndrome -  Continue tramadol PRN  4. Glaucoma of both eyes, unspecified glaucoma type -Continue dorzolamide-[place] eyedrops and cyclosporine eyedrops  5. Chronic GERD -   Continue omeprazole  6.  Urinary retention -  continue foley catheter and change monthly and PRN      I have filled out patient's discharge paperwork.  Patient will be transferred to [facility].  DME provided:  None  Total discharge time: Less than 30 minutes  Discharge time involved coordination of the discharge process with social worker, nursing staff and therapy department.    Durenda Age, DNP, MSN, FNP-BC [facility] [phone] (Monday-Friday 8:00 a.m. - 5:00 p.m.) [phone] (after hours)
